# Patient Record
Sex: Male | Born: 1960
Health system: Southern US, Community
[De-identification: ages and names within clinical notes are randomized; demographics above are authoritative.]

## PROBLEM LIST (undated history)

## (undated) DIAGNOSIS — F329 Major depressive disorder, single episode, unspecified: Secondary | ICD-10-CM

## (undated) DIAGNOSIS — F319 Bipolar disorder, unspecified: Secondary | ICD-10-CM

## (undated) DIAGNOSIS — K219 Gastro-esophageal reflux disease without esophagitis: Secondary | ICD-10-CM

## (undated) DIAGNOSIS — I771 Stricture of artery: Secondary | ICD-10-CM

## (undated) DIAGNOSIS — K449 Diaphragmatic hernia without obstruction or gangrene: Secondary | ICD-10-CM

## (undated) DIAGNOSIS — M1990 Unspecified osteoarthritis, unspecified site: Secondary | ICD-10-CM

## (undated) DIAGNOSIS — R079 Chest pain, unspecified: Secondary | ICD-10-CM

## (undated) DIAGNOSIS — I6502 Occlusion and stenosis of left vertebral artery: Secondary | ICD-10-CM

## (undated) DIAGNOSIS — R519 Headache, unspecified: Secondary | ICD-10-CM

## (undated) DIAGNOSIS — E785 Hyperlipidemia, unspecified: Secondary | ICD-10-CM

## (undated) DIAGNOSIS — E0500 Thyrotoxicosis with diffuse goiter without thyrotoxic crisis or storm: Secondary | ICD-10-CM

## (undated) DIAGNOSIS — I10 Essential (primary) hypertension: Secondary | ICD-10-CM

## (undated) DIAGNOSIS — E039 Hypothyroidism, unspecified: Secondary | ICD-10-CM

## (undated) DIAGNOSIS — I209 Angina pectoris, unspecified: Secondary | ICD-10-CM

## (undated) DIAGNOSIS — M5010 Cervical disc disorder with radiculopathy, unspecified cervical region: Secondary | ICD-10-CM

## (undated) DIAGNOSIS — F419 Anxiety disorder, unspecified: Secondary | ICD-10-CM

## (undated) DIAGNOSIS — K2270 Barrett's esophagus without dysplasia: Secondary | ICD-10-CM

## (undated) DIAGNOSIS — Z87438 Personal history of other diseases of male genital organs: Secondary | ICD-10-CM

## (undated) DIAGNOSIS — E782 Mixed hyperlipidemia: Secondary | ICD-10-CM

## (undated) DIAGNOSIS — I2510 Atherosclerotic heart disease of native coronary artery without angina pectoris: Secondary | ICD-10-CM

## (undated) DIAGNOSIS — M5136 Other intervertebral disc degeneration, lumbar region: Secondary | ICD-10-CM

## (undated) DIAGNOSIS — Z720 Tobacco use: Secondary | ICD-10-CM

## (undated) DIAGNOSIS — M51369 Other intervertebral disc degeneration, lumbar region without mention of lumbar back pain or lower extremity pain: Secondary | ICD-10-CM

## (undated) DIAGNOSIS — I779 Disorder of arteries and arterioles, unspecified: Secondary | ICD-10-CM

## (undated) DIAGNOSIS — F32A Depression, unspecified: Secondary | ICD-10-CM

## (undated) HISTORY — PX: OTHER SURGICAL HISTORY: SHX169

## (undated) HISTORY — DX: Major depressive disorder, single episode, unspecified: F32.9

## (undated) HISTORY — DX: Chest pain, unspecified: R07.9

## (undated) HISTORY — DX: Tobacco use: Z72.0

## (undated) HISTORY — DX: Barrett's esophagus without dysplasia: K22.70

## (undated) HISTORY — DX: Occlusion and stenosis of left vertebral artery: I65.02

## (undated) HISTORY — PX: KNEE SURGERY: SHX244

## (undated) HISTORY — DX: Stricture of artery: I77.1

## (undated) HISTORY — DX: Essential (primary) hypertension: I10

## (undated) HISTORY — DX: Depression, unspecified: F32.A

## (undated) HISTORY — DX: Diaphragmatic hernia without obstruction or gangrene: K44.9

## (undated) HISTORY — DX: Hyperlipidemia, unspecified: E78.5

## (undated) HISTORY — PX: THYROIDECTOMY: SHX17

## (undated) HISTORY — DX: Mixed hyperlipidemia: E78.2

## (undated) HISTORY — DX: Atherosclerotic heart disease of native coronary artery without angina pectoris: I25.10

## (undated) HISTORY — DX: Disorder of arteries and arterioles, unspecified: I77.9

## (undated) HISTORY — DX: Thyrotoxicosis with diffuse goiter without thyrotoxic crisis or storm: E05.00

---

## 1998-09-04 DIAGNOSIS — I209 Angina pectoris, unspecified: Secondary | ICD-10-CM

## 1998-09-04 DIAGNOSIS — I219 Acute myocardial infarction, unspecified: Secondary | ICD-10-CM

## 1998-09-04 HISTORY — PX: CARDIAC CATHETERIZATION: SHX172

## 1998-09-04 HISTORY — DX: Angina pectoris, unspecified: I20.9

## 1998-09-04 HISTORY — DX: Acute myocardial infarction, unspecified: I21.9

## 1999-05-25 ENCOUNTER — Ambulatory Visit (HOSPITAL_COMMUNITY): Admission: RE | Admit: 1999-05-25 | Discharge: 1999-05-25 | Payer: Self-pay | Admitting: *Deleted

## 1999-06-17 ENCOUNTER — Ambulatory Visit (HOSPITAL_COMMUNITY): Admission: RE | Admit: 1999-06-17 | Discharge: 1999-06-17 | Payer: Self-pay | Admitting: *Deleted

## 1999-12-28 ENCOUNTER — Encounter: Admission: RE | Admit: 1999-12-28 | Discharge: 1999-12-28 | Payer: Self-pay | Admitting: Urology

## 1999-12-28 ENCOUNTER — Encounter: Payer: Self-pay | Admitting: Urology

## 2000-01-26 ENCOUNTER — Ambulatory Visit (HOSPITAL_COMMUNITY): Admission: RE | Admit: 2000-01-26 | Discharge: 2000-01-26 | Payer: Self-pay | Admitting: Urology

## 2000-01-26 ENCOUNTER — Encounter: Payer: Self-pay | Admitting: Urology

## 2001-10-09 ENCOUNTER — Encounter: Admission: RE | Admit: 2001-10-09 | Discharge: 2001-10-09 | Payer: Self-pay | Admitting: Internal Medicine

## 2001-10-09 ENCOUNTER — Encounter: Payer: Self-pay | Admitting: Internal Medicine

## 2002-05-19 ENCOUNTER — Encounter: Payer: Self-pay | Admitting: Surgery

## 2002-05-22 ENCOUNTER — Encounter (INDEPENDENT_AMBULATORY_CARE_PROVIDER_SITE_OTHER): Payer: Self-pay | Admitting: Specialist

## 2002-05-22 ENCOUNTER — Observation Stay (HOSPITAL_COMMUNITY): Admission: RE | Admit: 2002-05-22 | Discharge: 2002-05-23 | Payer: Self-pay | Admitting: Surgery

## 2003-02-21 ENCOUNTER — Emergency Department (HOSPITAL_COMMUNITY): Admission: EM | Admit: 2003-02-21 | Discharge: 2003-02-21 | Payer: Self-pay | Admitting: Emergency Medicine

## 2006-02-19 ENCOUNTER — Encounter: Admission: RE | Admit: 2006-02-19 | Discharge: 2006-02-19 | Payer: Self-pay | Admitting: Internal Medicine

## 2006-03-25 ENCOUNTER — Emergency Department (HOSPITAL_COMMUNITY): Admission: EM | Admit: 2006-03-25 | Discharge: 2006-03-25 | Payer: Self-pay | Admitting: *Deleted

## 2006-12-13 ENCOUNTER — Emergency Department (HOSPITAL_COMMUNITY): Admission: EM | Admit: 2006-12-13 | Discharge: 2006-12-13 | Payer: Self-pay | Admitting: Emergency Medicine

## 2007-02-21 ENCOUNTER — Inpatient Hospital Stay (HOSPITAL_COMMUNITY): Admission: EM | Admit: 2007-02-21 | Discharge: 2007-02-22 | Payer: Self-pay | Admitting: Emergency Medicine

## 2007-02-27 ENCOUNTER — Emergency Department (HOSPITAL_COMMUNITY): Admission: EM | Admit: 2007-02-27 | Discharge: 2007-02-27 | Payer: Self-pay | Admitting: Emergency Medicine

## 2007-10-12 ENCOUNTER — Emergency Department (HOSPITAL_COMMUNITY): Admission: EM | Admit: 2007-10-12 | Discharge: 2007-10-12 | Payer: Self-pay | Admitting: Emergency Medicine

## 2007-10-19 ENCOUNTER — Emergency Department (HOSPITAL_COMMUNITY): Admission: EM | Admit: 2007-10-19 | Discharge: 2007-10-19 | Payer: Self-pay | Admitting: Family Medicine

## 2007-11-14 ENCOUNTER — Emergency Department (HOSPITAL_COMMUNITY): Admission: EM | Admit: 2007-11-14 | Discharge: 2007-11-15 | Payer: Self-pay | Admitting: Emergency Medicine

## 2008-03-07 ENCOUNTER — Emergency Department (HOSPITAL_COMMUNITY): Admission: EM | Admit: 2008-03-07 | Discharge: 2008-03-07 | Payer: Self-pay | Admitting: Emergency Medicine

## 2008-03-21 ENCOUNTER — Emergency Department (HOSPITAL_COMMUNITY): Admission: EM | Admit: 2008-03-21 | Discharge: 2008-03-21 | Payer: Self-pay | Admitting: Emergency Medicine

## 2008-10-21 ENCOUNTER — Ambulatory Visit (HOSPITAL_COMMUNITY): Admission: RE | Admit: 2008-10-21 | Discharge: 2008-10-21 | Payer: Self-pay | Admitting: Internal Medicine

## 2010-03-28 ENCOUNTER — Encounter (INDEPENDENT_AMBULATORY_CARE_PROVIDER_SITE_OTHER): Payer: Self-pay | Admitting: *Deleted

## 2010-04-04 ENCOUNTER — Ambulatory Visit: Payer: Self-pay | Admitting: Gastroenterology

## 2010-04-13 ENCOUNTER — Telehealth: Payer: Self-pay | Admitting: Gastroenterology

## 2010-10-06 NOTE — Progress Notes (Signed)
Summary: Needs new Movi prep  Phone Note Call from Patient   Call For: Dr Christella Hartigan Summary of Call: Pt fell and injured his leg. Rescheduled his Colon for 05-10-10. Has already mixed his Movi Prep so he will need a new script sent to his pharmacy. [facility] Initial call taken by: Leanor Kail [facility],  April 13, 2010 9:48 AM    New/Updated Medications: MOVIPREP 100 GM  SOLR (PEG-KCL-NACL-NASULF-NA ASC-C) As per prep instructions. Prescriptions: MOVIPREP 100 GM  SOLR (PEG-KCL-NACL-NASULF-NA ASC-C) As per prep instructions.  #1 x 0   Entered by:   Wyona Almas RN   Authorized by:   Rachael Fee MD   Signed by:   Wyona Almas RN on 04/13/2010   Method used:   Electronically to        [facility] (retail)       [address]       [place], [place]  [id number]       Ph: [phone]       Fax: [phone]   RxID:   [id number]

## 2010-10-06 NOTE — Progress Notes (Signed)
Summary: Rescheduled COL  Phone Note Call from Patient   Caller: Patient Call For: Dr. Christella Hartigan Summary of Call: pt. r/s his COL from today until 05-10-10 b/c he slipped and fell on his deck yesterday afternoon and pulled his hamstring and cannot walk. Had already mixed his Moviprep and will need a new script called in for his appt. on 05-10-10..... Would you like this pt. charged the cancelation fee? Initial call taken by: Karna Christmas,  April 13, 2010 8:57 AM  Follow-up for Phone Call        no charge since he was injured Follow-up by: Rachael Fee MD,  April 13, 2010 9:26 AM  Additional Follow-up for Phone Call Additional follow up Details #1::        Patient NOT BILLED. Additional Follow-up by: Leanor Kail [facility],  April 13, 2010 9:46 AM

## 2010-10-06 NOTE — Letter (Signed)
Summary: [facility] Instructions  [facility]  [address] [place], [place] [address]   Phone: [phone]  Fax: [phone]       [facility]    12-07-1960    MRN: [id number]        Procedure Day /Date:  04/13/10  Wednesday     Arrival Time:  1pm      Procedure Time: 2pm     Location of Procedure:                    _ x_  [facility] (4th Floor)   PREPARATION FOR COLONOSCOPY WITH MOVIPREP   Starting 5 days prior to your procedure _8/5/11 _ do not eat nuts, seeds, popcorn, corn, beans, peas,  salads, or any raw vegetables.  Do not take any fiber supplements (e.g. Metamucil, Citrucel, and Benefiber).  THE DAY BEFORE YOUR PROCEDURE         DATE:   04/12/10  DAY:  Tuesday  1.  Drink clear liquids the entire day-NO SOLID FOOD  2.  Do not drink anything colored red or purple.  Avoid juices with pulp.  No orange juice.  3.  Drink at least 64 oz. (8 glasses) of fluid/clear liquids during the day to prevent dehydration and help the prep work efficiently.  CLEAR LIQUIDS INCLUDE: Water Jello Ice Popsicles Tea (sugar ok, no milk/cream) Powdered fruit flavored drinks Coffee (sugar ok, no milk/cream) Gatorade Juice: apple, white grape, white cranberry  Lemonade Clear bullion, consomm, broth Carbonated beverages (any kind) Strained chicken noodle soup Hard Candy                             4.  In the morning, mix first dose of MoviPrep solution:    Empty 1 Pouch A and 1 Pouch B into the disposable container    Add lukewarm drinking water to the top line of the container. Mix to dissolve    Refrigerate (mixed solution should be used within 24 hrs)  5.  Begin drinking the prep at 5:00 p.m. The MoviPrep container is divided by 4 marks.   Every 15 minutes drink the solution down to the next mark (approximately 8 oz) until the full liter is complete.   6.  Follow completed prep with 16 oz of clear liquid of your choice (Nothing red or purple).   Continue to drink clear liquids until bedtime.  7.  Before going to bed, mix second dose of MoviPrep solution:    Empty 1 Pouch A and 1 Pouch B into the disposable container    Add lukewarm drinking water to the top line of the container. Mix to dissolve    Refrigerate  THE DAY OF YOUR PROCEDURE      DATE:   04/13/10  DAY:  Wednesday  Beginning at 5:00 a.m. (5 hours before procedure):         1. Every 15 minutes, drink the solution down to the next mark (approx 8 oz) until the full liter is complete.  2. Follow completed prep with 16 oz. of clear liquid of your choice.    3. You may drink clear liquids until  12n  (2 HOURS BEFORE PROCEDURE).   MEDICATION INSTRUCTIONS  Unless otherwise instructed, you should take regular prescription medications with a small sip of water   as early as possible the morning of your procedure.   Additional medication instructions: n/a  OTHER INSTRUCTIONS  You will need a responsible adult at least 49 years of age to accompany you and drive you home.   This person must remain in the waiting room during your procedure.  Wear loose fitting clothing that is easily removed.  Leave jewelry and other valuables at home.  However, you may wish to bring a book to read or  an iPod/MP3 player to listen to music as you wait for your procedure to start.  Remove all body piercing jewelry and leave at home.  Total time from sign-in until discharge is approximately 2-3 hours.  You should go home directly after your procedure and rest.  You can resume normal activities the  day after your procedure.  The day of your procedure you should not:   Drive   Make legal decisions   Operate machinery   Drink alcohol   Return to work  You will receive specific instructions about eating, activities and medications before you leave.    The above instructions have been reviewed and explained to me by   Sherren Kerns RN  April 04, 2010 2:23  PM    I fully understand and can verbalize these instructions _____________________________ Date _________

## 2010-10-06 NOTE — Miscellaneous (Signed)
Summary: previsit/rm  Clinical Lists Changes  Medications: Added new medication of MOVIPREP 100 GM  SOLR (PEG-KCL-NACL-NASULF-NA ASC-C) As per prep instructions. - Signed Rx of MOVIPREP 100 GM  SOLR (PEG-KCL-NACL-NASULF-NA ASC-C) As per prep instructions.;  #1 x 0;  Signed;  Entered by: Sherren Kerns RN;  Authorized by: Rachael Fee MD;  Method used: Electronically to [facility] [address] [place], [place]  [id number], Ph: [phone], Fax: [phone] Observations: Added new observation of ALLERGY REV: Done (04/04/2010 13:34)    Prescriptions: MOVIPREP 100 GM  SOLR (PEG-KCL-NACL-NASULF-NA ASC-C) As per prep instructions.  #1 x 0   Entered by:   Sherren Kerns RN   Authorized by:   Rachael Fee MD   Signed by:   Sherren Kerns RN on 04/04/2010   Method used:   Electronically to        [facility] (retail)       [address]       [place], [place]  [id number]       Ph: [phone]       Fax: [phone]   RxID:   [id number]

## 2011-01-17 NOTE — Consult Note (Signed)
[name], [name]            ACCOUNT NO.:  [id number]   MEDICAL RECORD NO.:  [id number]          PATIENT TYPE:  INP   LOCATION:  5707                         FACILITY:  [facility]   PHYSICIAN:  Zola Button T. Lazarus Salines, M.D. DATE OF BIRTH:  [birth year]   DATE OF CONSULTATION:  02/21/2007  DATE OF DISCHARGE:                                 CONSULTATION   CHIEF COMPLAINT:  Throat swelling.   HISTORY OF PRESENT ILLNESS:  This 50 year old white male was napping in  a hammock last evening.  He came back into the house attempted to take  his routine pills and found that he could not swallow them, both clumsy  and painful.  Shortly thereafter he felt like he had a lump in his  throat and looked in and saw that his uvula was enlarged.  He had the  strong sensation that he needed to cough and clear his throat and  actually produced a small amount of blood.  He came to the emergency  room for evaluation.  Dr. Read Drivers noted uvular edema consistent with  angioedema but felt like the level pain was out of proportion with this  typical diagnosis.  He did a lateral soft tissue of the neck which  suggested edema of the epiglottis and supraglottic structures and ENT  was called in consultation.  The patient is being admitted on the  internal medicine service of Dr. Waynard Edwards.   The patient is not taking ACE inhibitors.  He does smoke one pack per  day.  He has bipolar illness.  He has never had a prior similar problem.  He does not have any substantial reflux issues that he is aware of.  No  history of trauma to the throat.  He does not think inhaled an insect or  similar which may have stung him last evening outdoors.  No hoarseness.  Although his throat feels tight, he is breathing comfortably.  No  wheezing.  No belly cramping.  No unusual foods.  No new medications.  He does not take any ACE inhibitors although he does have hypertension.   PHYSICAL EXAMINATION:  This is articulate, nondistressed,  middle-aged  white male.  Voice is loud and clear without hot potato changes or any  stridor or labor.  Mental status is appropriate.  The head is atraumatic  and neck supple.  Cranial nerves intact.  The right ear canal is clear  with a normal drum.  Left ear canal has a wax impaction in the deep  canal which I could not clear in the emergency room and I could not see  the drum either.  Anterior nose is corrugated with a decent airway on  both sides and healthy membranes.  Oral cavity is moist with teeth in  good repair and normal size lips and tongue.  Oropharynx shows  substantial edema of the uvula but it is not translucent nor is not  erythematous.  No evidence of exudate.  The posterior pharyngeal wall  looks normal.  NECK:  Without swelling, tenderness, or adenopathy.  He has a scar  consistent with prior thyroidectomy.   Following  1% viscous Xylocaine to both sides of the nose, 5 mL total,  the flexible laryngoscope was introduced.  The nasopharynx is clear with  normal eustachian tori.  Oropharynx clear with prominent lingual  tonsils.  Hypopharynx reveals completely normal larynx with mobile vocal  cords and no swelling or edema of the structures.  No pooling,  valleculae or piriformis.   IMPRESSION:  Uvular edema with slight hemoptysis, odynophagia,  dysphagia.  This sounds most similar to angioedema although he is not  taking ACE inhibitors.  No fever, white count 7.8000 and normal  laryngeal examination, therefore he does not have epiglottitis.  I do  not think he has an infectious etiology.  He has an incidental left  cerumen impaction.   PLAN:  I will let Dr. Waynard Edwards pursue an angioedema workup.  If he does  not turn around fairly quickly, I would check a barium swallow.  I think  he is okay to advance diet as comfortable.  From an airway standpoint,  he could be  discharged at any time but it would be nice to know that his other  symptoms were improving before we let  him go.  I will follow him with  you in the hospital and then plan on seeing him again in my office in  several weeks.  He understands and agrees.      Gloris Manchester. Lazarus Salines, M.D.  Electronically Signed     KTW/MEDQ  D:  02/21/2007  T:  02/21/2007  Job:  [id number]   cc:   Loraine Leriche A. Perini, M.D.
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
## 2011-01-17 NOTE — Discharge Summary (Signed)
[name], [name]            ACCOUNT NO.:  [id number]   MEDICAL RECORD NO.:  [id number]          PATIENT TYPE:  INP   LOCATION:  5707                         FACILITY:  [facility]   PHYSICIAN:  Gaspar Garbe, M.D.DATE OF BIRTH:  Feb 21, 1961   DATE OF ADMISSION:  02/20/2007  DATE OF DISCHARGE:  02/22/2007                               DISCHARGE SUMMARY   DIAGNOSES:  1. Soft palate swelling, epiglottitis.  2. Question hymenopteran reaction.  3. Hypertension.  4. Graves' disease status post thyroid removal in 2003, on Synthroid.  5. Bipolar disorder, not otherwise specified.  6. Hyperlipidemia.   MEDICATIONS ON DISCHARGE:  1. Prednisone 40 mg a day x6 days.  2. Ceftin 500 mg twice a day x6 days.  3. Lotrel 10/20 once daily.  4. Atenolol 25 mg daily.  5. Tricor 145 mg once daily.  6. Synthroid 200 mcg once daily.  7. Wellbutrin XL 300 mg once daily.  8. Celexa 10 mg once daily.  9. Depakote ER 300 mg three times daily.  10.Klonopin 1 mg twice daily.  11.Epipen.   STUDIES:  X-ray consistent with soft tissue swelling and question of  epiglottitis.  Laboratory tests show cultures are negative for blood.  TSH normal at 1.216.  Depakote level 30.8 which is low.  Hepatic  function panel within normal limits.  White count 7.8, hemoglobin 14.5,  platelets 214.   SUMMARY OF HOSPITAL COURSE:  [name] was admitted by my partner Dr.  Inetta Fermo on February 20, 2007 after falling asleep in a hammock and waking up  with wheezing with breathing.  The patient had had some barbecue prior  and is not certain as to whether he had been stung by a bee, though he  did not notice any stings, although he has a preexisting condition of  allergy to hymenopteran venom.  The patient was seen in the emergency  room and found to have a swollen soft palate and signs consistent with  possible epiglottitis.  The patient was admitted late in the evening on  the 18th and was seen by Dr. Lazarus Salines with ENT.  He  was started on  dexamethasone and IV Rocephin.  The patient was observed throughout the  day on the 19th and throughout the evening of the 19th.  He did not have  any worsening of his swelling, in fact had considerable improvement.  On  the morning of the 20th he felt to be about 90% better.  His airway  appeared to be improved on physical examination, and he was not having  any further wheezing.  His uvula and soft palate were still somewhat  enlarged, however.  The patient was discharged on the above medications  in good condition.   PHYSICAL EXAMINATION ON DISCHARGE:  VITAL SIGNS:  Temperature 97.8,  blood pressure 142/86, respiratory rate 18, heart rate 68.  GENERAL:  No acute distress.  HEENT:  Normocephalic, atraumatic.  ENT shows an enlarged uvula and soft  palate.  No wheezing or stridor noted.  The patient has persistent  exophthalmus status post Graves'.  LUNGS:  Clear to auscultation bilaterally.  HEART:  Regular rate and rhythm.  No murmurs, rubs or gallops.  NECK:  Status post thyroidectomy.  EXTREMITIES:  No clubbing, cyanosis or edema.  NEURO:  The patient is oriented to person, place and time.  No deficits  noted.   DISPOSITION:  The patient is to follow up with me as needed.  He has  seen an allergist before and may require further allergy testing and  desensitization.  If he has any further difficulties with breathing he  is to contact our office or go to the emergency room this weekend.      Gaspar Garbe, M.D.  Electronically Signed     RWT/MEDQ  D:  02/22/2007  T:  02/23/2007  Job:  [id number]

## 2011-01-20 NOTE — Op Note (Signed)
[name], [name]                      ACCOUNT NO.:  [id number]   MEDICAL RECORD NO.:  [id number]                   PATIENT TYPE:  AMB   LOCATION:  DAY                                  FACILITY:  [facility]   PHYSICIAN:  Velora Heckler, M.D.                DATE OF BIRTH:  December 28, 1960   DATE OF PROCEDURE:  05/22/2002  DATE OF DISCHARGE:                                 OPERATIVE REPORT   PREOPERATIVE DIAGNOSIS:  Graves' disease.   POSTOPERATIVE DIAGNOSIS:  Graves' disease.   PROCEDURE:  Total thyroidectomy.   SURGEON:  Velora Heckler, M.D.   ASSISTANT:  Rose Phi. Maple Hudson, M.D.   ANESTHESIA:  General.   ESTIMATED BLOOD LOSS:  Minimal.   PREPARATION:  Betadine.   COMPLICATIONS:  None.   INDICATIONS FOR PROCEDURE:  The patient is a 50 year old white male who  presents with longstanding Graves's disease. The patient has been treated on  PTU and beta blockade. He is followed by Dr. Wylene Simmer at [facility]. The patient has developed ocular involvement. He now comes for  thyroidectomy for management of Graves' disease.   DESCRIPTION OF PROCEDURE:  The procedure was done in OR #2 at the [facility]. The patient's brought to the operating room, placed  in the supine position on the operating room table. Following the  administration of general anesthesia, the patient is positioned and then  prepped and draped in the usual strict aseptic fashion. After ascertaining  that an adequate level of anesthesia had been obtained, a Kocher incision  was made with a #10 blade. Dissection was carried down through the platysma  and subcutaneous tissues. Hemostasis was obtained with the electrocautery.  Skin flaps were developed cephalad and caudad using the electrocautery for  hemostasis. Skin flaps are developed from the thyroid notch to the sternal  notch. A Mahorner self retaining retractor was placed for exposure. The  strap muscles are incised in the  midline and dissection is begun on the left  side. The strap muscles are reflected laterally. The left thyroid lobe is  small, firm, without dominant mass or nodule. Venous tributaries are divided  between small Ligaclips. The adventitial tissues are gently stripped off the  surface of the gland. The superior pole is mobilized. The superior pole  vessels are ligated in continuity with 2-0 silk ligatures and medium  Ligaclips and then divided. The gland is rolled anteriorly. The branches of  the inferior thyroid artery are divided between small Ligaclips. Dissection  is carried on the surface of the gland. Care is taken to avoid injury to  parathyroid glands or recurrent laryngeal nerve. Inferior venus tributaries  are divided between small Ligaclips. The gland is rolled up and onto the  anterior surface of the trachea. The ligament of Allyson Sabal is transected. The  gland is mobilized over into the mid trachea. A dry pack is placed in the  left neck. Next, we turned our attention to the right neck. Again the strap  muscles are reflected laterally. The gland is mobilized. The venus  tributaries are divided between small and medium Ligaclips. The right lobe  of the gland is much larger than the left, again firm, without dominant mass  or nodule. It extends considerably posterior. The gland is mobilized with  blunt dissection with a Barista. The superior pole vessels again  are ligated in continuity with 2-0 silk ties and medium Ligaclips and  divided. The inferior pole venous tributaries are divided between Ligaclips.  The gland is rolled anteriorly. The branches of the inferior thyroid artery  are divided between small Ligaclips. Again care is taken to preserve the  recurrent laryngeal nerve and parathyroid tissue. The ligament of Allyson Sabal is  transected and the gland is rolled up and onto the trachea. The remaining  isthmus of the thyroid gland is excised off of the trachea. A venous   tributary in the lower midline is ligated with a 2-0 silk ligature and  divided. The gland is marked with a suture in the left lobe superior pole  and submitted to pathology for review. The neck is irrigated with warm  saline. Surgicel is placed over the area of the recurrent laryngeal nerves  bilaterally. Good hemostasis is noted. The strap muscles were reapproximated  in the midline with interrupted 3-0 Vicryl sutures. The platysma was  reapproximated with interrupted 3-0 Vicryl sutures. The skin edges are  reapproximated with widely spaced stainless steel staples and interspaced  1/2 inch Steri-Strips and Benzoin. Sterile dressings are placed. The patient  is awakened from anesthesia and brought to the recovery room in stable  condition. The patient tolerated the procedure well.                                               Velora Heckler, M.D.    TMG/MEDQ  D:  05/22/2002  T:  05/22/2002  Job:  [id number]   cc:   Gaspar Garbe, M.D.

## 2011-05-29 LAB — COMPREHENSIVE METABOLIC PANEL
Alkaline Phosphatase: 126 — ABNORMAL HIGH
BUN: 16
Chloride: 104
GFR calc non Af Amer: >60
Glucose, Bld: 100 — ABNORMAL HIGH
Potassium: 3.8
Total Bilirubin: 0.6

## 2011-05-29 LAB — DIFFERENTIAL
Basophils Absolute: 0.1
Basophils Relative: 1
Neutro Abs: 8 — ABNORMAL HIGH
Neutrophils Relative %: 61

## 2011-05-29 LAB — POCT CARDIAC MARKERS
CKMB, poc: 1.4
Myoglobin, poc: 33.2

## 2011-05-29 LAB — CBC
HCT: 37.8 — ABNORMAL LOW
Hemoglobin: 13
RDW: 14.3
WBC: 13.2 — ABNORMAL HIGH

## 2011-06-02 LAB — POCT CARDIAC MARKERS: CKMB, poc: 1.1

## 2011-06-21 LAB — HEPATIC FUNCTION PANEL
Alkaline Phosphatase: 87
Bilirubin, Direct: <0.1
Total Bilirubin: 0.5
Total Protein: 7.5

## 2011-06-21 LAB — I-STAT 8, (EC8 V) (CONVERTED LAB)
Acid-Base Excess: 1
Glucose, Bld: 133 — ABNORMAL HIGH
HCT: 46
Hemoglobin: 15.6
Operator id: 270651
Potassium: 4.8
Sodium: 140
TCO2: 27

## 2011-06-21 LAB — CBC
Hemoglobin: 14.5
MCHC: 33.7
MCV: 90
RBC: 4.78

## 2011-06-21 LAB — DIFFERENTIAL
Basophils Relative: 1
Monocytes Absolute: 0.2
Monocytes Relative: 3
Neutro Abs: 6.3

## 2011-06-21 LAB — CULTURE, BLOOD (ROUTINE X 2): Culture: NO GROWTH

## 2011-06-21 LAB — TSH: TSH: 1.216

## 2011-07-11 ENCOUNTER — Other Ambulatory Visit: Payer: Self-pay | Admitting: Internal Medicine

## 2011-07-11 ENCOUNTER — Ambulatory Visit
Admission: RE | Admit: 2011-07-11 | Discharge: 2011-07-11 | Disposition: A | Discharge status: Home / Self Care | Payer: Medicare Other | Source: Ambulatory Visit | Attending: Internal Medicine | Admitting: Internal Medicine

## 2011-07-11 DIAGNOSIS — M5412 Radiculopathy, cervical region: Secondary | ICD-10-CM

## 2011-09-13 ENCOUNTER — Other Ambulatory Visit: Payer: Self-pay | Admitting: Internal Medicine

## 2011-09-13 DIAGNOSIS — R42 Dizziness and giddiness: Secondary | ICD-10-CM

## 2011-09-15 ENCOUNTER — Ambulatory Visit
Admission: RE | Admit: 2011-09-15 | Discharge: 2011-09-15 | Disposition: A | Discharge status: Home / Self Care | Payer: Medicare Other | Source: Ambulatory Visit | Attending: Internal Medicine | Admitting: Internal Medicine

## 2011-09-15 ENCOUNTER — Other Ambulatory Visit: Payer: Medicare Other

## 2011-09-15 DIAGNOSIS — R42 Dizziness and giddiness: Secondary | ICD-10-CM

## 2011-10-03 ENCOUNTER — Other Ambulatory Visit: Payer: Self-pay

## 2011-10-03 ENCOUNTER — Inpatient Hospital Stay (HOSPITAL_COMMUNITY)
Admission: EM | Admit: 2011-10-03 | Discharge: 2011-10-06 | DRG: 305 | Disposition: A | Discharge status: Home / Self Care | Payer: Medicare Other | Attending: Internal Medicine | Admitting: Internal Medicine

## 2011-10-03 ENCOUNTER — Encounter (HOSPITAL_COMMUNITY): Payer: Self-pay | Admitting: Emergency Medicine

## 2011-10-03 DIAGNOSIS — E039 Hypothyroidism, unspecified: Secondary | ICD-10-CM | POA: Diagnosis present

## 2011-10-03 DIAGNOSIS — K219 Gastro-esophageal reflux disease without esophagitis: Secondary | ICD-10-CM | POA: Diagnosis present

## 2011-10-03 DIAGNOSIS — I10 Essential (primary) hypertension: Principal | ICD-10-CM | POA: Diagnosis present

## 2011-10-03 DIAGNOSIS — E0500 Thyrotoxicosis with diffuse goiter without thyrotoxic crisis or storm: Secondary | ICD-10-CM | POA: Diagnosis present

## 2011-10-03 DIAGNOSIS — R112 Nausea with vomiting, unspecified: Secondary | ICD-10-CM | POA: Diagnosis present

## 2011-10-03 DIAGNOSIS — E785 Hyperlipidemia, unspecified: Secondary | ICD-10-CM | POA: Diagnosis present

## 2011-10-03 DIAGNOSIS — F319 Bipolar disorder, unspecified: Secondary | ICD-10-CM | POA: Diagnosis present

## 2011-10-03 DIAGNOSIS — R51 Headache: Secondary | ICD-10-CM | POA: Diagnosis present

## 2011-10-03 DIAGNOSIS — R079 Chest pain, unspecified: Secondary | ICD-10-CM | POA: Diagnosis present

## 2011-10-03 DIAGNOSIS — K5900 Constipation, unspecified: Secondary | ICD-10-CM | POA: Diagnosis present

## 2011-10-03 HISTORY — DX: Thyrotoxicosis with diffuse goiter without thyrotoxic crisis or storm: E05.00

## 2011-10-03 HISTORY — DX: Essential (primary) hypertension: I10

## 2011-10-03 HISTORY — DX: Bipolar disorder, unspecified: F31.9

## 2011-10-03 NOTE — ED Notes (Signed)
Pt states he has had chest pain since this morning  Pt states his blood pressure has been elevated all day  Pt has also had vomiting today with fever at times  Pt states he has also been dizzy today

## 2011-10-03 NOTE — ED Notes (Signed)
Pt st's he started having chest pain, SOB, and nausea this morning around 3am.  St's the pain has radiated to his L arm but isn't right now.  St's the pain is a pressure type pain, pt's BP is 202/108, st's he's had a h/a most of the day.  Also st's he has vomited twice, still feels nauseated and has been dry heaving.  No abdominal pain, pt has been feeling dizzy.  St's his blood pressure has been this high all day, st's he has taken his BP meds today.

## 2011-10-04 ENCOUNTER — Encounter (HOSPITAL_COMMUNITY): Payer: Self-pay | Admitting: Family Medicine

## 2011-10-04 ENCOUNTER — Emergency Department (HOSPITAL_COMMUNITY): Payer: Medicare Other

## 2011-10-04 ENCOUNTER — Other Ambulatory Visit: Payer: Self-pay

## 2011-10-04 DIAGNOSIS — R079 Chest pain, unspecified: Secondary | ICD-10-CM

## 2011-10-04 DIAGNOSIS — F319 Bipolar disorder, unspecified: Secondary | ICD-10-CM | POA: Diagnosis present

## 2011-10-04 DIAGNOSIS — E0500 Thyrotoxicosis with diffuse goiter without thyrotoxic crisis or storm: Secondary | ICD-10-CM

## 2011-10-04 DIAGNOSIS — I10 Essential (primary) hypertension: Secondary | ICD-10-CM | POA: Diagnosis present

## 2011-10-04 HISTORY — DX: Chest pain, unspecified: R07.9

## 2011-10-04 HISTORY — DX: Thyrotoxicosis with diffuse goiter without thyrotoxic crisis or storm: E05.00

## 2011-10-04 LAB — DIFFERENTIAL
Basophils Absolute: 0.1 10*3/uL (ref 0.0–0.1)
Basophils Relative: 1 % (ref 0–1)
Eosinophils Relative: 2 % (ref 0–5)
Monocytes Absolute: 1 10*3/uL (ref 0.1–1.0)
Monocytes Relative: 7 % (ref 3–12)
Neutro Abs: 8.6 10*3/uL — ABNORMAL HIGH (ref 1.7–7.7)

## 2011-10-04 LAB — LIPID PANEL
Cholesterol: 300 mg/dL — ABNORMAL HIGH (ref 0–200)
HDL: 42 mg/dL (ref >39)
Total CHOL/HDL Ratio: 7.1 RATIO

## 2011-10-04 LAB — CBC
HCT: 39.4 % (ref 39.0–52.0)
Hemoglobin: 14.2 g/dL (ref 13.0–17.0)
Hemoglobin: 14.1 g/dL (ref 13.0–17.0)
MCH: 30 pg (ref 26.0–34.0)
MCHC: 36 g/dL (ref 30.0–36.0)
MCV: 83.1 fL (ref 78.0–100.0)
MCV: 82.7 fL (ref 78.0–100.0)
RDW: 12.8 % (ref 11.5–15.5)

## 2011-10-04 LAB — URINALYSIS, ROUTINE W REFLEX MICROSCOPIC
Ketones, ur: NEGATIVE mg/dL
Leukocytes, UA: NEGATIVE
Nitrite: NEGATIVE
Protein, ur: NEGATIVE mg/dL
pH: 6.5 (ref 5.0–8.0)

## 2011-10-04 LAB — COMPREHENSIVE METABOLIC PANEL
Albumin: 4.7 g/dL (ref 3.5–5.2)
BUN: 13 mg/dL (ref 6–23)
Calcium: 10.4 mg/dL (ref 8.4–10.5)
Chloride: 97 mEq/L (ref 96–112)
Creatinine, Ser: 0.82 mg/dL (ref 0.50–1.35)
GFR calc non Af Amer: >90 mL/min (ref >90)
Total Bilirubin: 0.3 mg/dL (ref 0.3–1.2)

## 2011-10-04 LAB — MAGNESIUM: Magnesium: 1.7 mg/dL (ref 1.5–2.5)

## 2011-10-04 LAB — LIPASE, BLOOD: Lipase: 58 U/L (ref 11–59)

## 2011-10-04 LAB — CARDIAC PANEL(CRET KIN+CKTOT+MB+TROPI): Total CK: 849 U/L — ABNORMAL HIGH (ref 7–232)

## 2011-10-04 LAB — MRSA PCR SCREENING: MRSA by PCR: NEGATIVE

## 2011-10-04 LAB — CREATININE, SERUM
GFR calc Af Amer: >90 mL/min (ref >90)
GFR calc non Af Amer: >90 mL/min (ref >90)

## 2011-10-04 MED ORDER — CITALOPRAM HYDROBROMIDE 20 MG PO TABS
20.0000 mg | ORAL_TABLET | Freq: Every day | ORAL | Status: DC
  Administered 2011-10-04 – 2011-10-06 (×3): 20 mg via ORAL
  Filled 2011-10-04 (×4): qty 1

## 2011-10-04 MED ORDER — NITROGLYCERIN 2 % TD OINT
1.0000 [in_us] | TOPICAL_OINTMENT | Freq: Once | TRANSDERMAL | Status: AC
  Administered 2011-10-04: 1 [in_us] via TOPICAL

## 2011-10-04 MED ORDER — RISPERIDONE 1 MG PO TABS
1.0000 mg | ORAL_TABLET | Freq: Every day | ORAL | Status: DC
  Administered 2011-10-04 – 2011-10-05 (×2): 1 mg via ORAL
  Filled 2011-10-04 (×4): qty 1

## 2011-10-04 MED ORDER — ENOXAPARIN SODIUM 40 MG/0.4ML ~~LOC~~ SOLN
40.0000 mg | SUBCUTANEOUS | Status: DC
  Administered 2011-10-04 – 2011-10-06 (×3): 40 mg via SUBCUTANEOUS
  Filled 2011-10-04 (×3): qty 0.4

## 2011-10-04 MED ORDER — PROMETHAZINE HCL 25 MG/ML IJ SOLN
12.5000 mg | Freq: Four times a day (QID) | INTRAMUSCULAR | Status: DC | PRN
  Administered 2011-10-04: 18:00:00 via INTRAVENOUS
  Filled 2011-10-04: qty 1

## 2011-10-04 MED ORDER — ACETAMINOPHEN 325 MG PO TABS
650.0000 mg | ORAL_TABLET | Freq: Four times a day (QID) | ORAL | Status: DC | PRN
  Administered 2011-10-04: 650 mg via ORAL
  Filled 2011-10-04: qty 2

## 2011-10-04 MED ORDER — SODIUM CHLORIDE 0.9 % IV SOLN
INTRAVENOUS | Status: DC
  Administered 2011-10-04 – 2011-10-05 (×3): via INTRAVENOUS

## 2011-10-04 MED ORDER — FENOFIBRATE 54 MG PO TABS
54.0000 mg | ORAL_TABLET | Freq: Every day | ORAL | Status: DC
  Administered 2011-10-04 – 2011-10-06 (×3): 54 mg via ORAL
  Filled 2011-10-04 (×4): qty 1

## 2011-10-04 MED ORDER — ASPIRIN 81 MG PO CHEW
324.0000 mg | CHEWABLE_TABLET | Freq: Once | ORAL | Status: AC
  Administered 2011-10-04: 324 mg via ORAL
  Filled 2011-10-04: qty 4

## 2011-10-04 MED ORDER — OXYCODONE HCL 15 MG PO TB12: 15.0000 mg | ORAL_TABLET | ORAL | Status: DC | PRN

## 2011-10-04 MED ORDER — QUETIAPINE FUMARATE ER 300 MG PO TB24
300.0000 mg | ORAL_TABLET | Freq: Every day | ORAL | Status: DC
  Administered 2011-10-04 – 2011-10-05 (×2): 300 mg via ORAL
  Filled 2011-10-04 (×5): qty 1

## 2011-10-04 MED ORDER — BUPROPION HCL ER (SR) 150 MG PO TB12
300.0000 mg | ORAL_TABLET | Freq: Every day | ORAL | Status: DC
  Administered 2011-10-04 – 2011-10-06 (×3): 300 mg via ORAL
  Filled 2011-10-04 (×4): qty 2

## 2011-10-04 MED ORDER — HYDRALAZINE HCL 20 MG/ML IJ SOLN
10.0000 mg | Freq: Four times a day (QID) | INTRAMUSCULAR | Status: DC | PRN
  Administered 2011-10-04: 18:00:00 via INTRAVENOUS
  Filled 2011-10-04: qty 0.5

## 2011-10-04 MED ORDER — HYDRALAZINE HCL 20 MG/ML IJ SOLN
10.0000 mg | Freq: Once | INTRAMUSCULAR | Status: AC
  Administered 2011-10-04: 10 mg via INTRAVENOUS
  Filled 2011-10-04 (×2): qty 0.5

## 2011-10-04 MED ORDER — DOXAZOSIN MESYLATE 8 MG PO TABS
8.0000 mg | ORAL_TABLET | Freq: Every day | ORAL | Status: DC
  Administered 2011-10-04 – 2011-10-05 (×2): 8 mg via ORAL
  Filled 2011-10-04 (×5): qty 1

## 2011-10-04 MED ORDER — LEVOTHYROXINE SODIUM 150 MCG PO TABS
150.0000 ug | ORAL_TABLET | Freq: Every day | ORAL | Status: DC
  Administered 2011-10-04 – 2011-10-06 (×3): 150 ug via ORAL
  Filled 2011-10-04 (×4): qty 1

## 2011-10-04 MED ORDER — ONDANSETRON HCL 4 MG/2ML IJ SOLN
4.0000 mg | Freq: Once | INTRAMUSCULAR | Status: AC
  Administered 2011-10-04: 4 mg via INTRAVENOUS
  Filled 2011-10-04: qty 2

## 2011-10-04 MED ORDER — ACETAMINOPHEN 650 MG RE SUPP: 650.0000 mg | Freq: Four times a day (QID) | RECTAL | Status: DC | PRN

## 2011-10-04 MED ORDER — NITROGLYCERIN 2 % TD OINT
TOPICAL_OINTMENT | TRANSDERMAL | Status: AC
  Filled 2011-10-04: qty 30

## 2011-10-04 MED ORDER — LABETALOL HCL 5 MG/ML IV SOLN
10.0000 mg | Freq: Once | INTRAVENOUS | Status: AC
  Administered 2011-10-04: 10 mg via INTRAVENOUS
  Filled 2011-10-04: qty 4

## 2011-10-04 MED ORDER — ONDANSETRON HCL 4 MG/2ML IJ SOLN
4.0000 mg | Freq: Four times a day (QID) | INTRAMUSCULAR | Status: DC | PRN
  Administered 2011-10-04 (×4): 4 mg via INTRAVENOUS
  Filled 2011-10-04 (×4): qty 2

## 2011-10-04 MED ORDER — DIAZEPAM 5 MG PO TABS
5.0000 mg | ORAL_TABLET | Freq: Three times a day (TID) | ORAL | Status: DC | PRN
  Administered 2011-10-04: 5 mg via ORAL
  Filled 2011-10-04: qty 1

## 2011-10-04 MED ORDER — ONDANSETRON HCL 4 MG/2ML IJ SOLN
INTRAMUSCULAR | Status: AC
  Filled 2011-10-04: qty 2

## 2011-10-04 MED ORDER — HYDRALAZINE HCL 20 MG/ML IJ SOLN
10.0000 mg | Freq: Once | INTRAMUSCULAR | Status: AC
  Administered 2011-10-04: 10 mg via INTRAVENOUS
  Filled 2011-10-04: qty 0.5

## 2011-10-04 MED ORDER — POTASSIUM CHLORIDE CRYS ER 20 MEQ PO TBCR
20.0000 meq | EXTENDED_RELEASE_TABLET | Freq: Two times a day (BID) | ORAL | Status: DC
  Administered 2011-10-04 – 2011-10-06 (×4): 20 meq via ORAL
  Filled 2011-10-04 (×7): qty 1

## 2011-10-04 MED ORDER — ALUM & MAG HYDROXIDE-SIMETH 200-200-20 MG/5ML PO SUSP: 30.0000 mL | Freq: Four times a day (QID) | ORAL | Status: DC | PRN

## 2011-10-04 MED ORDER — ONDANSETRON HCL 4 MG PO TABS: 4.0000 mg | ORAL_TABLET | Freq: Four times a day (QID) | ORAL | Status: DC | PRN

## 2011-10-04 MED ORDER — LABETALOL HCL 200 MG PO TABS
200.0000 mg | ORAL_TABLET | Freq: Two times a day (BID) | ORAL | Status: DC
  Administered 2011-10-04 – 2011-10-06 (×4): 200 mg via ORAL
  Filled 2011-10-04 (×6): qty 1

## 2011-10-04 MED ORDER — DOCUSATE SODIUM 100 MG PO CAPS
100.0000 mg | ORAL_CAPSULE | Freq: Two times a day (BID) | ORAL | Status: DC
  Administered 2011-10-04 – 2011-10-06 (×5): 100 mg via ORAL
  Filled 2011-10-04 (×7): qty 1

## 2011-10-04 MED ORDER — ONDANSETRON HCL 4 MG/2ML IJ SOLN
4.0000 mg | Freq: Once | INTRAMUSCULAR | Status: AC
  Administered 2011-10-04: 4 mg via INTRAVENOUS

## 2011-10-04 MED ORDER — OMEGA-3-ACID ETHYL ESTERS 1 G PO CAPS
4.0000 g | ORAL_CAPSULE | Freq: Every day | ORAL | Status: DC
  Administered 2011-10-05 – 2011-10-06 (×2): 4 g via ORAL
  Filled 2011-10-04 (×4): qty 4

## 2011-10-04 MED ORDER — ATENOLOL 100 MG PO TABS
100.0000 mg | ORAL_TABLET | Freq: Every day | ORAL | Status: DC
  Administered 2011-10-04: 100 mg via ORAL
  Filled 2011-10-04 (×2): qty 1

## 2011-10-04 MED ORDER — NITROPRUSSIDE SODIUM 25 MG/ML IV SOLN
0.2500 ug/kg/min | INTRAVENOUS | Status: DC
  Administered 2011-10-04: 0.25 ug/kg/min via INTRAVENOUS
  Filled 2011-10-04 (×2): qty 2

## 2011-10-04 NOTE — ED Notes (Signed)
Nipride drip off

## 2011-10-04 NOTE — H&P (Signed)
PCP:   Dr Della Goo  Chief Complaint:  Chest pain  HPI: This is a 51 year old male with known history of hypertension difficult to control. He states although today his blood pressure is been extremely elevated. His systolic blood pressures is a been as high as 221. He's had a headache, he's had nausea and vomiting. He came to the ER when he fell developed chest pains and left-sided, no radiation. Chest pain was described as sharp but that has since resolved. He also reports he was dizzy. He reports no neurological deficits no slurred speech no localized weakness, no altered mental status. He has no cardiac history. He did have a stress test of the heart it 10 years ago. He was about that time he had cardiac vasospasms. Here in the year the patient continues to be nauseous and his blood pressure remains elevated. History provided by the patient was alert and oriented. Patient states his blood pressure is always elevated with systolics often in the 170's. He states he's been on multiple blood pressure medications including clonidine and hydralazine, which has not helped. He does not think he's never had a renal ultrasound to check for renal artery stenosis.  Review of Systems:   anorexia, fever, weight loss,, vision loss, decreased hearing, hoarseness, chest pain, syncope, dyspnea on exertion, peripheral edema, balance deficits, hemoptysis, abdominal pain, melena, hematochezia, severe indigestion/heartburn, hematuria, incontinence, genital sores, muscle weakness, suspicious skin lesions, transient blindness, difficulty walking, depression, unusual weight change, abnormal bleeding, enlarged lymph nodes, angioedema, and breast masses.  Past Medical History: Past Medical History  Diagnosis Date  . Bipolar 1 disorder   . Grave's disease   . Hypertension    Past Surgical History  Procedure Date  . Cardiac catheterization   . Testicular torsion surgery   . Thyroidectomy   . Knee surgery      Medications: Prior to Admission medications   Medication Sig Start Date End Date Taking? Authorizing Provider  atenolol (TENORMIN) 100 MG tablet Take 100 mg by mouth daily.   Yes Historical Provider, MD  buPROPion (WELLBUTRIN SR) 150 MG 12 hr tablet Take 300 mg by mouth daily.    Yes Historical Provider, MD  citalopram (CELEXA) 20 MG tablet Take 20 mg by mouth daily.    Yes Historical Provider, MD  diazepam (VALIUM) 5 MG tablet Take 5 mg by mouth every 8 (eight) hours as needed. Anxiety   Yes Historical Provider, MD  doxazosin (CARDURA) 8 MG tablet Take 8 mg by mouth at bedtime.   Yes Historical Provider, MD  fenofibrate (TRICOR) 145 MG tablet Take 145 mg by mouth daily.   Yes Historical Provider, MD  levothyroxine (SYNTHROID, LEVOTHROID) 150 MCG tablet Take 150 mcg by mouth daily.   Yes Historical Provider, MD  omega-3 acid ethyl esters (LOVAZA) 1 G capsule Take 4 g by mouth daily.   Yes Historical Provider, MD  oxyCODONE (OXYCONTIN) 15 MG TB12 Take 15 mg by mouth as needed.   Yes Historical Provider, MD  QUEtiapine (SEROQUEL XR) 300 MG 24 hr tablet Take 300 mg by mouth at bedtime.   Yes Historical Provider, MD  risperiDONE (RISPERDAL) 1 MG tablet Take 1 mg by mouth at bedtime.    Yes Historical Provider, MD    Allergies:  No Known Allergies  Social History:  reports that he has quit smoking. He does not have any smokeless tobacco history on file. He reports that he does not drink alcohol or use illicit drugs.  Family History: Family  History  Problem Relation Age of Onset  . Hypertension Mother   . Hypertension Father   . Cancer Other   . Hypertension Other     Physical Exam: Filed Vitals:   10/04/11 0327 10/04/11 0400 10/04/11 0417 10/04/11 0511  BP: 186/108 181/99 194/100   Pulse: 81 78    Temp: 98.1 F (36.7 C)     TempSrc: Oral     Resp: 14 14    Weight:    95 kg (209 lb 7 oz)  SpO2: 100% 100%      General:  Alert and oriented times three, well developed and  nourished, no acute distress Eyes: PERRLA, pink conjunctiva, no scleral icterus ENT: Moist oral mucosa, neck supple, no thyromegaly Lungs: clear to ascultation, no wheeze, no crackles, no use of accessory muscles Cardiovascular: regular rate and rhythm, no regurgitation, no gallops, no murmurs. No carotid bruits, no JVD Abdomen: soft, positive BS, non-tender, non-distended, no organomegaly, not an acute abdomen GU: not examined Neuro: CN II - XII grossly intact, sensation intact Musculoskeletal: strength 5/5 all extremities, no clubbing, cyanosis or edema Skin: no rash, no subcutaneous crepitation, no decubitus Psych: appropriate patient   Labs on Admission:   [facility] 10/03/11 0040  NA 135  K 3.2*  CL 97  CO2 24  GLUCOSE 90  BUN 13  CREATININE 0.82  CALCIUM 10.4  MG --  PHOS --    Basename 10/03/11 0040  AST 27  ALT 54*  ALKPHOS 123*  BILITOT 0.3  PROT 8.4*  ALBUMIN 4.7    Basename 10/03/11 0040  LIPASE 58  AMYLASE --    Basename 10/03/11 0040  WBC 13.8*  NEUTROABS 8.6*  HGB 14.2  HCT 39.4  MCV 83.1  PLT 289    Basename 10/03/11 2340  CKTOTAL 849*  CKMB 3.6  CKMBINDEX --  TROPONINI <0.30   No components found with this basename: POCBNP:3  Basename 10/03/11 0040  DDIMER <0.22   No results found for this basename: HGBA1C:2 in the last 72 hours No results found for this basename: CHOL:2,HDL:2,LDLCALC:2,TRIG:2,CHOLHDL:2,LDLDIRECT:2 in the last 72 hours No results found for this basename: TSH,T4TOTAL,FREET3,T3FREE,THYROIDAB in the last 72 hours No results found for this basename: VITAMINB12:2,FOLATE:2,FERRITIN:2,TIBC:2,IRON:2,RETICCTPCT:2 in the last 72 hours  Micro Results: No results found for this or any previous visit (from the past 240 hour(s)).   Radiological Exams on Admission: Dg Chest 2 View  10/04/2011  *RADIOLOGY REPORT*  Clinical Data: Chest pain, hypertension, dizziness  CHEST - 2 VIEW  Comparison: 10/12/2007  Findings: Upper-normal  size of cardiac silhouette. Mediastinal contours and pulmonary vascularity normal. Lungs clear. No pleural effusion or pneumothorax. Question prior thyroidectomy. No acute osseous findings.  IMPRESSION: No acute abnormalities.  Original Report Authenticated By: Lollie Marrow, M.D.   EKG normal sinus  Assessment/Plan Present on Admission:  .Chest pain .Malignant hypertension Headache/nausea vomiting Admit patient to ICU Patient with no neurological deficits Nitride are ordered  Cycle cardiac enzymes and lipid panels Rule out secondary cause for uncontrolled hypertension, renal ultrasound ordered and catecholamine ordered Aspirin, nitroglycerin and lipid panel in a.m. Protonix and antiemetics ordered Bipolar disease Resume home medications   Full code  DVT prophylaxis Team 2   Hedi Barkan 10/04/2011, 6:08 AM

## 2011-10-04 NOTE — ED Provider Notes (Signed)
History     CSN: [id number]  Arrival date & time 10/03/11  2126   First MD Initiated Contact with Patient 10/04/11 0013      Chief Complaint  Patient presents with  . Chest Pain  . Hypertension    (Consider location/radiation/quality/duration/timing/severity/associated sxs/prior treatment) Patient is a 51 y.o. male presenting with chest pain and hypertension. The history is provided by the patient.  Chest Pain The chest pain began yesterday. Chest pain occurs intermittently. The chest pain is resolved. The pain is currently at 0/10. The quality of the pain is described as pressure-like. The pain does not radiate. Primary symptoms include nausea and dizziness. Pertinent negatives for primary symptoms include no fever, no syncope, no shortness of breath, no cough, no wheezing, no palpitations, no abdominal pain and no vomiting.  Dizziness also occurs with nausea. Dizziness does not occur with vomiting or weakness.   Pertinent negatives for associated symptoms include no numbness and no weakness.  His past medical history is significant for hypertension.    Hypertension Associated symptoms include chest pain and headaches. Pertinent negatives include no abdominal pain and no shortness of breath.   Pt woke this Am with pressure-like non-radiating CP and has had several episodes throughout the day lasting 10-15 min each. Currently pain is completely resolved. Pt states he has also had elevated BP throughout the day with intermittent throbbing HA. Pain-free at this time. He has had persistent Nausea with no vomiting. No focal weakness, vision changes.  Past Medical History  Diagnosis Date  . Bipolar 1 disorder   . Grave's disease   . Hypertension     Past Surgical History  Procedure Date  . Cardiac catheterization   . Testicular torsion surgery   . Thyroidectomy   . Knee surgery     Family History  Problem Relation Age of Onset  . Hypertension Mother   . Hypertension Father     . Cancer Other   . Hypertension Other     History  Substance Use Topics  . Smoking status: Former Games developer  . Smokeless tobacco: Never Used  . Alcohol Use: No      Review of Systems  Constitutional: Negative for fever and chills.  HENT: Negative for neck pain and neck stiffness.   Eyes: Negative for photophobia and visual disturbance.  Respiratory: Negative for cough, shortness of breath and wheezing.   Cardiovascular: Positive for chest pain. Negative for palpitations and syncope.  Gastrointestinal: Positive for nausea. Negative for vomiting and abdominal pain.  Genitourinary: Positive for flank pain.  Musculoskeletal: Negative for back pain and joint swelling.  Neurological: Positive for dizziness and headaches. Negative for syncope, weakness and numbness.    Allergies  Review of patient's allergies indicates no known allergies.  Home Medications   No current outpatient prescriptions on file.  BP 162/84  Pulse 77  Temp(Src) 98.4 F (36.9 C) (Oral)  Resp 18  Ht 5\' 8"  (1.727 m)  Wt 208 lb 14.4 oz (94.756 kg)  BMI 31.76 kg/m2  SpO2 96%  Physical Exam  Nursing note and vitals reviewed. Constitutional: He is oriented to person, place, and time. He appears well-developed and well-nourished. No distress.  HENT:  Head: Normocephalic and atraumatic.  Mouth/Throat: Oropharynx is clear and moist.  Eyes: EOM are normal. Pupils are equal, round, and reactive to light.  Neck: Normal range of motion. Neck supple.  Cardiovascular: Normal rate and regular rhythm.   Pulmonary/Chest: Effort normal and breath sounds normal. No respiratory distress. He has  no wheezes. He has no rales.  Abdominal: Soft. Bowel sounds are normal. He exhibits no mass. There is tenderness. There is no rebound and no guarding.       Pt with mild TTP over epigastrum  Musculoskeletal: Normal range of motion. He exhibits no edema and no tenderness.  Neurological: He is alert and oriented to person, place,  and time.  Skin: Skin is warm and dry. No rash noted. No erythema.  Psychiatric: He has a normal mood and affect. His behavior is normal.    ED Course  Procedures (including critical care time)  Labs Reviewed  CBC - Abnormal; Notable for the following:    WBC 13.8 (*)    All other components within normal limits  DIFFERENTIAL - Abnormal; Notable for the following:    Neutro Abs 8.6 (*)    All other components within normal limits  COMPREHENSIVE METABOLIC PANEL - Abnormal; Notable for the following:    Potassium 3.2 (*)    Total Protein 8.4 (*)    ALT 54 (*)    Alkaline Phosphatase 123 (*)    All other components within normal limits  URINALYSIS, ROUTINE W REFLEX MICROSCOPIC - Abnormal; Notable for the following:    Hgb urine dipstick TRACE (*)    All other components within normal limits  CARDIAC PANEL(CRET KIN+CKTOT+MB+TROPI) - Abnormal; Notable for the following:    Total CK 849 (*)    All other components within normal limits  CBC - Abnormal; Notable for the following:    WBC 13.2 (*)    All other components within normal limits  LIPID PANEL - Abnormal; Notable for the following:    Cholesterol 300 (*)    Triglycerides 369 (*)    VLDL 74 (*)    LDL Cholesterol 184 (*)    All other components within normal limits  BASIC METABOLIC PANEL - Abnormal; Notable for the following:    Glucose, Bld 110 (*)    All other components within normal limits  TSH - Abnormal; Notable for the following:    TSH 18.560 (*)    All other components within normal limits  LIPASE, BLOOD  D-DIMER, QUANTITATIVE  URINE MICROSCOPIC-ADD ON  MAGNESIUM  CREATININE, SERUM  MRSA PCR SCREENING  T4, FREE  CORTISOL  CATECHOLAMINES, FRACTIONATED, PLASMA   No results found.  Date: 10/04/2011  Rate: 65  Rhythm: normal sinus rhythm  QRS Axis: normal  Intervals: normal  ST/T Wave abnormalities: normal  Conduction Disutrbances:none  Narrative Interpretation:   Old EKG Reviewed: unchanged    1.  HTN (hypertension)       MDM          Loren Racer, MD 10/06/11 0730

## 2011-10-04 NOTE — ED Notes (Signed)
Patient transported to X-ray 

## 2011-10-04 NOTE — ED Notes (Signed)
Pt complaining of neck pain, st's he recently had a carotid doppler study and they found a partial blockage of the LCA, bruit auscultated.  Pt still complaining of dizziness.

## 2011-10-04 NOTE — ED Notes (Signed)
Emesis 100cc meds given see MAR

## 2011-10-04 NOTE — ED Notes (Signed)
Pt st's his chest pain was an 8/10 prior to administering 1" of Nitro Paste.  The nitro paste has brought pain down to a 3.  Placed pt on 2L supplemental 02.

## 2011-10-04 NOTE — Progress Notes (Addendum)
Subjective: No CP, no headaches. Patient now hungry and w/o acute distress  Objective: Vital signs in last 24 hours: Temp:  [98.1 F (36.7 C)-99.5 F (37.5 C)] 99.5 F (37.5 C) (01/30 2000) Pulse Rate:  [42-107] 96  (01/30 2100) Resp:  [10-22] 21  (01/30 2100) BP: (144-197)/(84-110) 158/96 mmHg (01/30 2100) SpO2:  [93 %-100 %] 97 % (01/30 2100) Weight:  [88.8 kg (195 lb 12.3 oz)-95 kg (209 lb 7 oz)] 88.8 kg (195 lb 12.3 oz) (01/30 1900) Weight change:     Intake/Output from previous day:   Total I/O In: -  Out: 250 [Urine:250]   Physical Exam: General: Alert, awake, oriented x3, in no acute distress. HEENT: No bruits, no goiter. Heart: Regular rate and rhythm, + murmur, rubs, gallops. Lungs: Clear to auscultation bilaterally. Abdomen: Soft, nontender, nondistended, positive bowel sounds. No bruits Extremities: No clubbing cyanosis or edema with positive pedal pulses. Neuro: Grossly intact, nonfocal.  Lab Results: Basic Metabolic Panel:  Basename 10/04/11 0545 10/03/11 0040  NA -- 135  K -- 3.2*  CL -- 97  CO2 -- 24  GLUCOSE -- 90  BUN -- 13  CREATININE 0.77 0.82  CALCIUM -- 10.4  MG 1.7 --  PHOS -- --   Liver Function Tests:  Basename 10/03/11 0040  AST 27  ALT 54*  ALKPHOS 123*  BILITOT 0.3  PROT 8.4*  ALBUMIN 4.7    Basename 10/03/11 0040  LIPASE 58  AMYLASE --   CBC:  Basename 10/04/11 0545 10/03/11 0040  WBC 13.2* 13.8*  NEUTROABS -- 8.6*  HGB 14.1 14.2  HCT 39.7 39.4  MCV 82.7 83.1  PLT 287 289   Cardiac Enzymes:  Basename 10/03/11 2340  CKTOTAL 849*  CKMB 3.6  CKMBINDEX --  TROPONINI <0.30   D-Dimer:  Basename 10/03/11 0040  DDIMER <0.22   Fasting Lipid Panel:  Basename 10/04/11 0545  CHOL 300*  HDL 42  LDLCALC 184*  TRIG 369*  CHOLHDL 7.1  LDLDIRECT --   Urinalysis:  Basename 10/04/11 0033  COLORURINE YELLOW  LABSPEC 1.014  PHURINE 6.5  GLUCOSEU NEGATIVE  HGBUR TRACE*  BILIRUBINUR NEGATIVE  KETONESUR  NEGATIVE  PROTEINUR NEGATIVE  UROBILINOGEN 0.2  NITRITE NEGATIVE  LEUKOCYTESUR NEGATIVE   Misc. Labs:  Recent Results (from the past 240 hour(s))  MRSA PCR SCREENING     Status: Normal   Collection Time   10/04/11  6:51 PM      Component Value Range Status Comment   MRSA by PCR NEGATIVE  NEGATIVE  Final     Studies/Results: Dg Chest 2 View  10/04/2011  *RADIOLOGY REPORT*  Clinical Data: Chest pain, hypertension, dizziness  CHEST - 2 VIEW  Comparison: 10/12/2007  Findings: Upper-normal size of cardiac silhouette. Mediastinal contours and pulmonary vascularity normal. Lungs clear. No pleural effusion or pneumothorax. Question prior thyroidectomy. No acute osseous findings.  IMPRESSION: No acute abnormalities.  Original Report Authenticated By: Lollie Marrow, M.D.    Medications: Scheduled Meds:   . aspirin  324 mg Oral Once  . buPROPion  300 mg Oral Daily  . citalopram  20 mg Oral Daily  . docusate sodium  100 mg Oral BID  . doxazosin  8 mg Oral QHS  . enoxaparin  40 mg Subcutaneous Q24H  . fenofibrate  54 mg Oral Daily  . hydrALAZINE  10 mg Intravenous Once  . hydrALAZINE  10 mg Intravenous Once  . labetalol  200 mg Oral BID  . labetalol  10 mg Intravenous  Once  . levothyroxine  150 mcg Oral Daily  . nitroGLYCERIN  1 inch Topical Once  . omega-3 acid ethyl esters  4 g Oral Daily  . ondansetron  4 mg Intravenous Once  . ondansetron  4 mg Intravenous Once  . potassium chloride  20 mEq Oral BID  . QUEtiapine  300 mg Oral QHS  . risperiDONE  1 mg Oral QHS  . DISCONTD: atenolol  100 mg Oral Daily   Continuous Infusions:   . sodium chloride 100 mL/hr at 10/04/11 2100  . DISCONTD: nitroPRUSSide Stopped (10/04/11 1800)   PRN Meds:.acetaminophen, acetaminophen, alum & mag hydroxide-simeth, diazepam, hydrALAZINE, ondansetron (ZOFRAN) IV, ondansetron, oxyCODONE, promethazine  Assessment/Plan: 1-Malignant hypertension: Follow renal vascular US to r/o renal stenosis; continue  antihypertensive regimen and low sodium diet. BP improved and patient out of the Drip now.  2-Chest pain: cardiac enzymes so far negative; will hold on cardiology consult at this moment; will order 2-D echo and continue tx for malignant hypertension.  3-Bipolar 1 disorder: continue home meds.  4-Grave's disease: s/p thyroid removal; continue synthroid; will check TSH and free T4.  5-GERD: continue PPI.  6-DVT: continue lovenox.    LOS: 1 day   Maebelle Sulton [facility] [phone]  10/04/2011, 9:18 PM

## 2011-10-05 DIAGNOSIS — I10 Essential (primary) hypertension: Secondary | ICD-10-CM

## 2011-10-05 DIAGNOSIS — I87309 Chronic venous hypertension (idiopathic) without complications of unspecified lower extremity: Secondary | ICD-10-CM

## 2011-10-05 HISTORY — DX: Essential (primary) hypertension: I10

## 2011-10-05 LAB — BASIC METABOLIC PANEL
CO2: 21 mEq/L (ref 19–32)
Calcium: 9 mg/dL (ref 8.4–10.5)
GFR calc non Af Amer: >90 mL/min (ref >90)
Glucose, Bld: 110 mg/dL — ABNORMAL HIGH (ref 70–99)
Potassium: 3.5 mEq/L (ref 3.5–5.1)
Sodium: 138 mEq/L (ref 135–145)

## 2011-10-05 LAB — CORTISOL: Cortisol, Plasma: 11.9 ug/dL

## 2011-10-05 NOTE — Progress Notes (Addendum)
Subjective: No CP, no headaches. Patient in no distress and feeling a lot better.  Objective: Vital signs in last 24 hours: Temp:  [99 F (37.2 C)-99.8 F (37.7 C)] 99.8 F (37.7 C) (01/31 0400) Pulse Rate:  [42-108] 85  (01/31 0700) Resp:  [10-22] 19  (01/31 0700) BP: (116-178)/(68-103) 134/72 mmHg (01/31 0700) SpO2:  [93 %-100 %] 96 % (01/31 0700) Weight:  [88.8 kg (195 lb 12.3 oz)] 88.8 kg (195 lb 12.3 oz) (01/30 1900) Weight change: -6.2 kg (-13 lb 10.7 oz)    Intake/Output from previous day: 01/30 0701 - 01/31 0700 In: 1200 [I.V.:1200] Out: 1900 [Urine:1900]    Physical Exam: General: Alert, awake, oriented x3, in no acute distress. HEENT: No bruits, no goiter. Heart: Regular rate and rhythm, positive systolic murmur, no rubs, no gallops. Lungs: Clear to auscultation bilaterally. Abdomen: Soft, nontender, nondistended, positive bowel sounds. No bruits Extremities: No clubbing cyanosis or edema with positive pedal pulses. Neuro: Grossly intact, nonfocal.  Lab Results: Basic Metabolic Panel:  Basename 10/05/11 0315 10/04/11 0545 10/03/11 0040  NA 138 -- 135  K 3.5 -- 3.2*  CL 104 -- 97  CO2 21 -- 24  GLUCOSE 110* -- 90  BUN 11 -- 13  CREATININE 0.79 0.77 --  CALCIUM 9.0 -- 10.4  MG -- 1.7 --  PHOS -- -- --   Liver Function Tests:  Basename 10/03/11 0040  AST 27  ALT 54*  ALKPHOS 123*  BILITOT 0.3  PROT 8.4*  ALBUMIN 4.7    Basename 10/03/11 0040  LIPASE 58  AMYLASE --   CBC:  Basename 10/04/11 0545 10/03/11 0040  WBC 13.2* 13.8*  NEUTROABS -- 8.6*  HGB 14.1 14.2  HCT 39.7 39.4  MCV 82.7 83.1  PLT 287 289   Cardiac Enzymes:  Basename 10/03/11 2340  CKTOTAL 849*  CKMB 3.6  CKMBINDEX --  TROPONINI <0.30   D-Dimer:  Basename 10/03/11 0040  DDIMER <0.22   Fasting Lipid Panel:  Basename 10/04/11 0545  CHOL 300*  HDL 42  LDLCALC 184*  TRIG 369*  CHOLHDL 7.1  LDLDIRECT --   Urinalysis:  Basename 10/04/11 0033  COLORURINE  YELLOW  LABSPEC 1.014  PHURINE 6.5  GLUCOSEU NEGATIVE  HGBUR TRACE*  BILIRUBINUR NEGATIVE  KETONESUR NEGATIVE  PROTEINUR NEGATIVE  UROBILINOGEN 0.2  NITRITE NEGATIVE  LEUKOCYTESUR NEGATIVE   Misc. Labs:  Recent Results (from the past 240 hour(s))  MRSA PCR SCREENING     Status: Normal   Collection Time   10/04/11  6:51 PM      Component Value Range Status Comment   MRSA by PCR NEGATIVE  NEGATIVE  Final     Studies/Results: Dg Chest 2 View  10/04/2011  *RADIOLOGY REPORT*  Clinical Data: Chest pain, hypertension, dizziness  CHEST - 2 VIEW  Comparison: 10/12/2007  Findings: Upper-normal size of cardiac silhouette. Mediastinal contours and pulmonary vascularity normal. Lungs clear. No pleural effusion or pneumothorax. Question prior thyroidectomy. No acute osseous findings.  IMPRESSION: No acute abnormalities.  Original Report Authenticated By: Lollie Marrow, M.D.    Medications: Scheduled Meds:    . buPROPion  300 mg Oral Daily  . citalopram  20 mg Oral Daily  . docusate sodium  100 mg Oral BID  . doxazosin  8 mg Oral QHS  . enoxaparin  40 mg Subcutaneous Q24H  . fenofibrate  54 mg Oral Daily  . labetalol  200 mg Oral BID  . levothyroxine  150 mcg Oral Daily  . omega-3  acid ethyl esters  4 g Oral Daily  . potassium chloride  20 mEq Oral BID  . QUEtiapine  300 mg Oral QHS  . risperiDONE  1 mg Oral QHS  . DISCONTD: atenolol  100 mg Oral Daily   Continuous Infusions:    . sodium chloride 100 mL/hr at 10/05/11 0200  . DISCONTD: nitroPRUSSide Stopped (10/04/11 1800)   PRN Meds:.acetaminophen, acetaminophen, alum & mag hydroxide-simeth, diazepam, hydrALAZINE, ondansetron (ZOFRAN) IV, ondansetron, oxyCODONE, promethazine  Assessment/Plan: 1-Malignant hypertension: Follow renal vascular US to r/o renal stenosis; continue antihypertensive regimen and low sodium diet. BP improved. Continue current regimen for now. Will check cortisol level as well.  2-Chest pain: cardiac  enzymes so far negative; will hold on cardiology consult at this moment; will follow 2-D echo and continue tx for hypertension.  3-Bipolar 1 disorder: continue home meds.  4-Grave's disease: status post thyroid removal continue levothyroxine; will follow TSH and free T4 results.  5-GERD: continue PPI.  6-DVT: continue lovenox.  7-Hypokalemia: repleted and WNL now.    LOS: 2 days   Damani Kelemen [facility] [phone]  10/05/2011, 8:16 AM

## 2011-10-05 NOTE — Progress Notes (Signed)
VASCULAR LAB   Bilateral renal artery duplex completed.    Terance Hart, RVT 10/05/2011, 10:53 AM

## 2011-10-05 NOTE — Progress Notes (Signed)
  Echocardiogram 2D Echocardiogram has been performed.  [name] Kayvan Hoefling 10/05/2011, 8:56 AM

## 2011-10-06 LAB — T4, FREE: Free T4: 1.28 ng/dL (ref 0.80–1.80)

## 2011-10-06 LAB — TSH: TSH: 18.56 u[IU]/mL — ABNORMAL HIGH (ref 0.350–4.500)

## 2011-10-06 MED ORDER — DSS 100 MG PO CAPS: 100.0000 mg | ORAL_CAPSULE | Freq: Two times a day (BID) | ORAL | Status: AC

## 2011-10-06 MED ORDER — TRIAMTERENE-HCTZ 37.5-25 MG PO CAPS: 1.0000 | ORAL_CAPSULE | ORAL | Status: DC

## 2011-10-06 MED ORDER — LEVOTHYROXINE SODIUM 200 MCG PO TABS: 200.0000 ug | ORAL_TABLET | Freq: Every day | ORAL | Status: DC

## 2011-10-06 MED ORDER — LABETALOL HCL 200 MG PO TABS: 200.0000 mg | ORAL_TABLET | Freq: Two times a day (BID) | ORAL | Status: DC

## 2011-10-06 MED ORDER — PRAVASTATIN SODIUM 40 MG PO TABS: 40.0000 mg | ORAL_TABLET | Freq: Every day | ORAL | Status: DC

## 2011-10-06 NOTE — Discharge Summary (Signed)
Physician Discharge Summary  Patient ID: [name] MRN: [id number] DOB/AGE: [birth year] 50 y.o.  Admit date: 10/03/2011 Discharge date: 10/06/2011  Primary Care Physician:  Maryella Shivers   Discharge Diagnoses:   Chest pain (noncardiac in etiology) most likely due to reflux and malignant hypertension syndrome. Malignant hypertension Bipolar 1 disorder Grave's disease (S/p thyroidectomy) Hypothyroidism HLD Constipation (2/2 his hypothyroidism and narcotics use)  Medication List  As of 10/06/2011  1:56 PM   STOP taking these medications         atenolol 100 MG tablet         TAKE these medications         buPROPion 150 MG 12 hr tablet   Commonly known as: WELLBUTRIN SR   Take 300 mg by mouth daily.      citalopram 20 MG tablet   Commonly known as: CELEXA   Take 20 mg by mouth daily.      diazepam 5 MG tablet   Commonly known as: VALIUM   Take 5 mg by mouth every 8 (eight) hours as needed. Anxiety      doxazosin 8 MG tablet   Commonly known as: CARDURA   Take 8 mg by mouth at bedtime.      DSS 100 MG Caps   Take 100 mg by mouth 2 (two) times daily.      fenofibrate 145 MG tablet   Commonly known as: TRICOR   Take 145 mg by mouth daily.      labetalol 200 MG tablet   Commonly known as: NORMODYNE   Take 1 tablet (200 mg total) by mouth 2 (two) times daily.      levothyroxine 200 MCG tablet   Commonly known as: SYNTHROID, LEVOTHROID   Take 1 tablet (200 mcg total) by mouth daily. -Take medication on an empty stomach and at least 40 minutes away from any other medications.      omega-3 acid ethyl esters 1 G capsule   Commonly known as: LOVAZA   Take 4 g by mouth daily.      oxyCODONE 15 MG Tb12   Commonly known as: OXYCONTIN   Take 15 mg by mouth as needed.      pravastatin 40 MG tablet   Commonly known as: PRAVACHOL   Take 1 tablet (40 mg total) by mouth daily.      QUEtiapine 300 MG 24 hr tablet   Commonly known as: SEROQUEL XR   Take 300 mg  by mouth at bedtime.      risperiDONE 1 MG tablet   Commonly known as: RISPERDAL   Take 1 mg by mouth at bedtime.      triamterene-hydrochlorothiazide 37.5-25 MG per capsule   Commonly known as: DYAZIDE   Take 1 each (1 capsule total) by mouth every morning.            Disposition and Follow-up:  Patient has been discharged in a stable and improved condition; currently without any chest pain, headache, vision changes or nausea/vomiting. Patient has been started on triamterene and labetalol; and also advised to follow a low-sodium heart healthy diet. Patient will follow with his PCP over the next 2 weeks in order to follow BP, BMET (for electrolytes and renal function) and for further medication adjustments. Patient TSH was in the 18th range reason why his Synthroid medication has been adjusted; he will require thyroid function followup and further adjustment. His LDL was also elevated reason why he has been now started on Pravachol.  Patient was advised to follow a low-fat diet and to continue using his Lovaza and Tricor.   Consults:   None   Significant Diagnostic Studies:  Dg Chest 2 View  10/04/2011  *RADIOLOGY REPORT*  Clinical Data: Chest pain, hypertension, dizziness  CHEST - 2 VIEW  Comparison: 10/12/2007  Findings: Upper-normal size of cardiac silhouette. Mediastinal contours and pulmonary vascularity normal. Lungs clear. No pleural effusion or pneumothorax. Question prior thyroidectomy. No acute osseous findings.  IMPRESSION: No acute abnormalities.  Original Report Authenticated By: Lollie Marrow, M.D.    Brief H and P: 51 year old male with a past medical history significant for Graves' disease, bipolar disorder and hypertension; came into the hospital secondary to strangely elevated blood pressure with a systolic in the 220s, he was having headaches, nausea, vomiting and chest discomfort. In the emergency department patient failed to have improvement of his blood pressure with  the use of hydralazine and clonidine. At that moment [facility] has been called for further evaluation and treatment.  Hospital Course:  1-chest pain (noncardiac in etiology): Most likely secondary to reflux and also malignant hypertension syndrome, especially with the associated nausea/vomiting that he was having. Chest x-rays have demonstrated no abnormalities, 2-D echo was also within normal limits, no telemetry abnormal findings and also negative cardiac enzymes. No ischemia on EKG was appreciated. A son of the patient's symptoms/blood pressure were under control he has never experienced any CP. At this point patient will be discharged with a new medication regimen for his blood pressure, instructions to follow a heart healthy/low sodium diet; and followup with his PCP over the next 2 weeks.  2-malignant hypertension: Workup for secondary causes remote unremarkable. Patient has been started on Triamterene and labetalol twice a day for blood pressure control. He will follow a low-sodium diet and will follow with his PCP for further medication adjustment.  3-Bipolar disorder: Stable and well controlled. He will continue using current home regimen.  4-Graves' disease: Status post thyroid removal with lab work demonstrating hypo-thyroidism; will adjust his Synthroid and he will follow with PCP as an outpatient.  5-hypokalemia: Secondary to volume contraction due to nausea/vomiting. Repleted during this admission and within normal limits at discharge.  6-hyperlipidemia: LDL in the 180s range; patient advised to follow a low fat diet and to use now Pravachol 40 mg by mouth daily.  7-constipation: Most likely associated with his hypothyroidism and narcotics use; thyroid medication has been adjusted and patient placed on Colace twice a day.    Time spent on Discharge: 40 minutes  Signed: Sanvika Cuttino 10/06/2011, 1:56 PM

## 2011-10-06 NOTE — Progress Notes (Signed)
Patient discharged to home, no complaints of pains or discomfort. parents at bedside, discharge instructions follow up appointment given and discussed with  to patient , verbalized understanding. PIV removed no s/s of infiltration  Or swelling. ,

## 2011-10-09 LAB — CATECHOLAMINES, FRACTIONATED, PLASMA: Epinephrine: 109 pg/mL

## 2013-10-07 ENCOUNTER — Other Ambulatory Visit: Payer: Self-pay | Admitting: Internal Medicine

## 2013-10-07 DIAGNOSIS — R1011 Right upper quadrant pain: Secondary | ICD-10-CM

## 2013-10-09 ENCOUNTER — Ambulatory Visit
Admission: RE | Admit: 2013-10-09 | Discharge: 2013-10-09 | Disposition: A | Discharge status: Home / Self Care | Payer: Medicare Other | Source: Ambulatory Visit | Attending: Internal Medicine | Admitting: Internal Medicine

## 2013-10-09 DIAGNOSIS — R1011 Right upper quadrant pain: Secondary | ICD-10-CM

## 2013-12-02 ENCOUNTER — Encounter (INDEPENDENT_AMBULATORY_CARE_PROVIDER_SITE_OTHER): Payer: Self-pay | Admitting: General Surgery

## 2013-12-04 ENCOUNTER — Encounter (INDEPENDENT_AMBULATORY_CARE_PROVIDER_SITE_OTHER): Payer: Self-pay

## 2013-12-04 ENCOUNTER — Ambulatory Visit (INDEPENDENT_AMBULATORY_CARE_PROVIDER_SITE_OTHER): Payer: Medicare Other | Admitting: General Surgery

## 2013-12-04 ENCOUNTER — Encounter (INDEPENDENT_AMBULATORY_CARE_PROVIDER_SITE_OTHER): Payer: Self-pay | Admitting: General Surgery

## 2013-12-04 VITALS — BP 128/82 | HR 76 | Temp 98.5°F | Ht 69.0 in | Wt 184.0 lb

## 2013-12-04 DIAGNOSIS — K8020 Calculus of gallbladder without cholecystitis without obstruction: Secondary | ICD-10-CM

## 2013-12-04 MED ORDER — PROMETHAZINE HCL 12.5 MG PO TABS: 12.5000 mg | ORAL_TABLET | Freq: Four times a day (QID) | ORAL | Status: DC | PRN

## 2013-12-04 NOTE — Patient Instructions (Signed)
Laparoscopic Cholecystectomy °Laparoscopic cholecystectomy is surgery to remove the gallbladder. The gallbladder is located in the upper right part of the abdomen, behind the liver. It is a storage sac for bile produced in the liver. Bile aids in the digestion and absorption of fats. Cholecystectomy is often done for inflammation of the gallbladder (cholecystitis). This condition is usually caused by a buildup of gallstones (cholelithiasis) in your gallbladder. Gallstones can block the flow of bile, resulting in inflammation and pain. In severe cases, emergency surgery may be required. When emergency surgery is not required, you will have time to prepare for the procedure. °Laparoscopic surgery is an alternative to open surgery. Laparoscopic surgery has a shorter recovery time. Your common bile duct may also need to be examined during the procedure. If stones are found in the common bile duct, they may be removed. °LET YOUR HEALTH CARE PROVIDER KNOW ABOUT: °· Any allergies you have. °· All medicines you are taking, including vitamins, herbs, eye drops, creams, and over-the-counter medicines. °· Previous problems you or members of your family have had with the use of anesthetics. °· Any blood disorders you have. °· Previous surgeries you have had. °· Medical conditions you have. °RISKS AND COMPLICATIONS °Generally, this is a safe procedure. However, as with any procedure, complications can occur. Possible complications include: °· Infection. °· Damage to the common bile duct, nerves, arteries, veins, or other internal organs such as the stomach, liver, or intestines. °· Bleeding. °· A stone may remain in the common bile duct. °· A bile leak from the cyst duct that is clipped when your gallbladder is removed. °· The need to convert to open surgery, which requires a larger incision in the abdomen. This may be necessary if your surgeon thinks it is not safe to continue with a laparoscopic procedure. °BEFORE THE  PROCEDURE °· Ask your health care provider about changing or stopping any regular medicines. You will need to stop taking aspirin or blood thinners at least 5 days prior to surgery. °· Do not eat or drink anything after midnight the night before surgery. °· Let your health care provider know if you develop a cold or other infectious problem before surgery. °PROCEDURE  °· You will be given medicine to make you sleep through the procedure (general anesthetic). A breathing tube will be placed in your mouth. °· When you are asleep, your surgeon will make several small cuts (incisions) in your abdomen. °· A thin, lighted tube with a tiny camera on the end (laparoscope) is inserted through one of the small incisions. The camera on the laparoscope sends a picture to a TV screen in the operating room. This gives the surgeon a good view inside your abdomen. °· A gas will be pumped into your abdomen. This expands your abdomen so that the surgeon has more room to perform the surgery. °· Other tools needed for the procedure are inserted through the other incisions. The gallbladder is removed through one of the incisions. °· After the removal of your gallbladder, the incisions will be closed with stitches, staples, or skin glue. °AFTER THE PROCEDURE °· You will be taken to a recovery area where your progress will be checked often. °· You may be allowed to go home the same day if your pain is controlled and you can tolerate liquids. °Document Released: 08/21/2005 Document Revised: 06/11/2013 Document Reviewed: 04/02/2013 °[employer]® Patient Information ©2014 [employer]. ° °

## 2013-12-04 NOTE — Progress Notes (Signed)
Patient ID: [name], male   DOB: 28-Jun-1961, 53 y.o.   MRN: [id number]  Chief Complaint  Patient presents with  . eval gallbladder    HPI Connor AlbeeGrant M James is a 53 y.o. male.   HPI 53 year old Caucasian male referred by Dr. Pete GlatterStoneking for evaluation of abdominal pain. The patient states he's been having right upper quarter and abdominal pain for about 3-4 months. It initially was intermittent now the pain is almost on a daily basis. It is sharp in intensity. It does radiate to his back and shoulder. It's more of an ache in his back. He does also have daily nausea. He has occasional vomiting. He denies any fever or chills. He noticed that if he eats spicy foods the discomfort will be worsened. He denies any melena or hematochezia. He denies any acholic stools. He has bowel movements about every 3 days. He felt bloated after he eats. He does take chronic narcotics for musculoskeletal pain. Phenergan has helped with the nausea.  Past Medical History  Diagnosis Date  . Bipolar 1 disorder   . Grave's disease   . Hypertension   . Hiatal hernia   . Depression   . Hyperlipidemia     Past Surgical History  Procedure Laterality Date  . Cardiac catheterization    . Testicular torsion surgery    . Thyroidectomy    . Knee surgery      Family History  Problem Relation Age of Onset  . Hypertension Mother   . Hypertension Father   . Cancer Other   . Hypertension Other     Social History History  Substance Use Topics  . Smoking status: Current Every Day Smoker -- 0.50 packs/day    Types: Cigarettes  . Smokeless tobacco: Never Used  . Alcohol Use: No    No Known Allergies  Current Outpatient Prescriptions  Medication Sig Dispense Refill  . buPROPion (WELLBUTRIN SR) 150 MG 12 hr tablet Take 300 mg by mouth daily.       . citalopram (CELEXA) 20 MG tablet Take 20 mg by mouth daily.       . diazepam (VALIUM) 5 MG tablet Take 5 mg by mouth every 8 (eight) hours as needed. Anxiety       . doxazosin (CARDURA) 8 MG tablet Take 8 mg by mouth at bedtime.      . fenofibrate (TRICOR) 145 MG tablet Take 145 mg by mouth daily.      Marland Kitchen. gemfibrozil (LOPID) 600 MG tablet Take 600 mg by mouth 2 (two) times daily before a meal.      . hydrALAZINE (APRESOLINE) 50 MG tablet       . levothyroxine (SYNTHROID, LEVOTHROID) 200 MCG tablet Take 1 tablet (200 mcg total) by mouth daily. -Take medication on an empty stomach and at least 40 minutes away from any other medications.  30 tablet  1  . nitroGLYCERIN (NITROSTAT) 0.4 MG SL tablet Place 0.4 mg under the tongue every 5 (five) minutes as needed for chest pain.      Marland Kitchen. omega-3 acid ethyl esters (LOVAZA) 1 G capsule Take 4 g by mouth daily.      . OxyCODONE HCl ER 30 MG T12A Take by mouth.      . QUEtiapine (SEROQUEL XR) 300 MG 24 hr tablet Take 300 mg by mouth at bedtime.      . risperiDONE (RISPERDAL) 1 MG tablet Take 1 mg by mouth at bedtime.       Marland Kitchen. labetalol (  NORMODYNE) 200 MG tablet Take 1 tablet (200 mg total) by mouth 2 (two) times daily.  60 tablet  1  . pravastatin (PRAVACHOL) 40 MG tablet Take 1 tablet (40 mg total) by mouth daily.  30 tablet  1  . promethazine (PHENERGAN) 12.5 MG tablet Take 1 tablet (12.5 mg total) by mouth every 6 (six) hours as needed for nausea or vomiting.  30 tablet  0  . triamterene-hydrochlorothiazide (DYAZIDE) 37.5-25 MG per capsule Take 1 each (1 capsule total) by mouth every morning.  30 capsule  1   No current facility-administered medications for this visit.    Review of Systems Review of Systems  Constitutional: Negative for fever, chills, appetite change and unexpected weight change.  HENT: Negative for congestion and trouble swallowing.   Eyes: Negative for visual disturbance.  Respiratory: Negative for chest tightness and shortness of breath.   Cardiovascular: Negative for chest pain (reports some angina) and leg swelling.       No PND, no orthopnea, some DOE  Gastrointestinal: Positive for  nausea, vomiting, abdominal pain and constipation. Negative for diarrhea.       See HPI  Genitourinary: Negative for dysuria and hematuria.  Musculoskeletal: Positive for back pain and neck pain.       On chronic narcotics for MSKL pain  Skin: Negative for rash.  Neurological: Negative for seizures and speech difficulty.  Hematological: Does not bruise/bleed easily.  Psychiatric/Behavioral: Negative for behavioral problems and confusion.    Blood pressure 128/82, pulse 76, temperature 98.5 F (36.9 C), temperature source Oral, height 5\' 9"  (1.753 m), weight 184 lb (83.462 kg).  Physical Exam Physical Exam  Constitutional: He is oriented to person, place, and time. He appears well-developed and well-nourished. No distress.  HENT:  Head: Normocephalic and atraumatic.  Right Ear: External ear normal.  Left Ear: External ear normal.  Eyes: Conjunctivae are normal. No scleral icterus.  Neck: Normal range of motion. Neck supple. No tracheal deviation present. No thyromegaly present.  Cardiovascular: Normal rate, normal heart sounds and intact distal pulses.   Pulmonary/Chest: Effort normal and breath sounds normal. No respiratory distress. He has no wheezes.  Abdominal: Soft. He exhibits no distension. Tenderness: mild RUQ TTP. There is no rebound and no guarding.  Small fascial defect at umbilicus -<0.5cm  Musculoskeletal: Normal range of motion. He exhibits no edema and no tenderness.  Lymphadenopathy:    He has no cervical adenopathy.  Neurological: He is alert and oriented to person, place, and time. He exhibits normal muscle tone.  Skin: Skin is warm and dry. No rash noted. He is not diaphoretic. No erythema. No pallor.  Psychiatric: He has a normal mood and affect. His behavior is normal. Judgment and thought content normal.    Data Reviewed Dr Pete Glatter office note Abd [place] 2/15 - multiple gallstones, fatty liver 2013 Hospital discharge summary for malignant hypertension-had  negative cardiac workup. Essentially normal echocardiogram Assessment    Symptomatic cholelithiasis     Plan    I believe the patient's symptoms are consistent with gallbladder disease.  We discussed gallbladder disease. The patient was given Agricultural engineer. We discussed non-operative and operative management. We discussed the signs & symptoms of acute cholecystitis  I discussed laparoscopic cholecystectomy with IOC in detail.  The patient was given educational material as well as diagrams detailing the procedure.  We discussed the risks and benefits of a laparoscopic cholecystectomy including, but not limited to bleeding, infection, injury to surrounding structures such as the intestine  or liver, bile leak, retained gallstones, need to convert to an open procedure, prolonged diarrhea, blood clots such as  DVT, common bile duct injury, anesthesia risks, and possible need for additional procedures.  We discussed the typical post-operative recovery course. I explained that the likelihood of improvement of their symptoms is good.  I did give him a prescription for Phenergan.  He will meet with the scheduler today to schedule surgery  Mary Sella. Andrey Campanile, MD, FACS General, Bariatric, & Minimally Invasive Surgery [facility], [place]         [facility] M 12/04/2013, 10:36 AM

## 2013-12-05 ENCOUNTER — Encounter (HOSPITAL_COMMUNITY): Payer: Self-pay | Admitting: Pharmacy Technician

## 2013-12-08 NOTE — Pre-Procedure Instructions (Signed)
Rosealee AlbeeGrant M Chermak  12/08/2013   Your procedure is scheduled on:    Thursday  12-11-13   [facility]  2 * 3 at 845 AM.  Call this number if you have problems the morning of surgery: [phone]   Remember:   Do not eat food or drink liquids after midnight.   Take these medicines the morning of surgery with A SIP OF WATER:  BUPROPION, LABETALOL, LEVOTHYROXINE, OXYCODONE IF NEEDED   Do not wear jewelry, make-up or nail polish.  Do not wear lotions, powders, or perfumes. You may wear deodorant.  Do not shave 48 hours prior to surgery. Men may shave face and neck.  Do not bring valuables to the hospital.  [facility] is not responsible                  for any belongings or valuables.               Contacts, dentures or bridgework may not be worn into surgery.  Leave suitcase in the car. After surgery it may be brought to your room.  For patients admitted to the hospital, discharge time is determined by your                treatment team.               Patients discharged the day of surgery will not be allowed to drive  home.  Name and phone number of your driver:   Special Instructions:  Special Instructions: [place] - Preparing for Surgery  Before surgery, you can play an important role.  Because skin is not sterile, your skin needs to be as free of germs as possible.  You can reduce the number of germs on you skin by washing with CHG (chlorahexidine gluconate) soap before surgery.  CHG is an antiseptic cleaner which kills germs and bonds with the skin to continue killing germs even after washing.  Please DO NOT use if you have an allergy to CHG or antibacterial soaps.  If your skin becomes reddened/irritated stop using the CHG and inform your nurse when you arrive at Short Stay.  Do not shave (including legs and underarms) for at least 48 hours prior to the first CHG shower.  You may shave your face.  Please follow these instructions carefully:   1.   Shower with CHG Soap the night before surgery and the morning of Surgery.  2.  If you choose to wash your hair, wash your hair first as usual with your normal shampoo.  3.  After you shampoo, rinse your hair and body thoroughly to remove the Shampoo.  4.  Use CHG as you would any other liquid soap. You can apply chg directly to the skin and wash gently with scrungie or a clean washcloth.  5.  Apply the CHG Soap to your body ONLY FROM THE NECK DOWN.  Do not use on open wounds or open sores.  Avoid contact with your eyes, ears, mouth and genitals (private parts).  Wash genitals (private parts with your normal soap.  6.  Wash thoroughly, paying special attention to the area where your surgery will be performed.  7.  Thoroughly rinse your body with warm water from the neck down.  8.  DO NOT shower/wash with your normal soap after using and rinsing off the CHG Soap.  9.  Pat yourself dry with a clean towel.  10.  Wear clean pajamas.            11.  Place clean sheets on your bed the night of your first shower and do not sleep with pets.  Day of Surgery  Do not apply any lotions/deodorants the morning of surgery.  Please wear clean clothes to the hospital/surgery center.   Please read over the following fact sheets that you were given: Pain Booklet, Coughing and Deep Breathing and Surgical Site Infection Prevention

## 2013-12-09 ENCOUNTER — Encounter (HOSPITAL_COMMUNITY): Payer: Self-pay

## 2013-12-09 ENCOUNTER — Encounter (HOSPITAL_COMMUNITY)
Admission: RE | Admit: 2013-12-09 | Discharge: 2013-12-09 | Disposition: A | Discharge status: Home / Self Care | Payer: Medicare Other | Source: Ambulatory Visit | Attending: General Surgery | Admitting: General Surgery

## 2013-12-09 ENCOUNTER — Telehealth (INDEPENDENT_AMBULATORY_CARE_PROVIDER_SITE_OTHER): Payer: Self-pay

## 2013-12-09 HISTORY — DX: Cervical disc disorder with radiculopathy, unspecified cervical region: M50.10

## 2013-12-09 HISTORY — DX: Angina pectoris, unspecified: I20.9

## 2013-12-09 HISTORY — DX: Personal history of other diseases of male genital organs: Z87.438

## 2013-12-09 HISTORY — DX: Gastro-esophageal reflux disease without esophagitis: K21.9

## 2013-12-09 HISTORY — DX: Other intervertebral disc degeneration, lumbar region: M51.36

## 2013-12-09 HISTORY — DX: Unspecified osteoarthritis, unspecified site: M19.90

## 2013-12-09 HISTORY — DX: Other intervertebral disc degeneration, lumbar region without mention of lumbar back pain or lower extremity pain: M51.369

## 2013-12-09 LAB — BASIC METABOLIC PANEL
BUN: 13 mg/dL (ref 6–23)
CALCIUM: 9.6 mg/dL (ref 8.4–10.5)
CO2: 25 mEq/L (ref 19–32)
Chloride: 101 mEq/L (ref 96–112)
Creatinine, Ser: 0.77 mg/dL (ref 0.50–1.35)
GFR calc Af Amer: >90 mL/min (ref >90)
GFR calc non Af Amer: >90 mL/min (ref >90)
GLUCOSE: 82 mg/dL (ref 70–99)
POTASSIUM: 3.2 meq/L — AB (ref 3.7–5.3)
SODIUM: 143 meq/L (ref 137–147)

## 2013-12-09 LAB — CBC
HCT: 38.6 % — ABNORMAL LOW (ref 39.0–52.0)
HEMOGLOBIN: 13.6 g/dL (ref 13.0–17.0)
MCH: 31 pg (ref 26.0–34.0)
MCHC: 35.2 g/dL (ref 30.0–36.0)
MCV: 87.9 fL (ref 78.0–100.0)
Platelets: 472 10*3/uL — ABNORMAL HIGH (ref 150–400)
RBC: 4.39 MIL/uL (ref 4.22–5.81)
RDW: 12.9 % (ref 11.5–15.5)
WBC: 10.6 10*3/uL — ABNORMAL HIGH (ref 4.0–10.5)

## 2013-12-09 NOTE — Telephone Encounter (Signed)
[facility] preadmission is requesting epic orders. The pt is scheduled for lap chole on 12/11/13 by Dr Andrey CampanileWilson.

## 2013-12-09 NOTE — Progress Notes (Signed)
STOP-Bang score 5; results faxed to PCP, Dr Pete GlatterStoneking @ [facility]

## 2013-12-10 ENCOUNTER — Other Ambulatory Visit (INDEPENDENT_AMBULATORY_CARE_PROVIDER_SITE_OTHER): Payer: Self-pay | Admitting: General Surgery

## 2013-12-10 MED ORDER — DEXTROSE 5 % IV SOLN
2.0000 g | INTRAVENOUS | Status: AC
  Administered 2013-12-11: 2 g via INTRAVENOUS
  Filled 2013-12-10: qty 2

## 2013-12-11 ENCOUNTER — Ambulatory Visit (HOSPITAL_COMMUNITY): Payer: Medicare Other | Admitting: Certified Registered"

## 2013-12-11 ENCOUNTER — Encounter (HOSPITAL_COMMUNITY): Payer: Self-pay | Admitting: *Deleted

## 2013-12-11 ENCOUNTER — Encounter (HOSPITAL_COMMUNITY): Payer: Medicare Other | Admitting: Certified Registered"

## 2013-12-11 ENCOUNTER — Ambulatory Visit (HOSPITAL_COMMUNITY): Payer: Medicare Other

## 2013-12-11 ENCOUNTER — Encounter (HOSPITAL_COMMUNITY)
Admission: RE | Disposition: A | Discharge status: Home / Self Care | Payer: Self-pay | Source: Ambulatory Visit | Attending: General Surgery

## 2013-12-11 ENCOUNTER — Observation Stay (HOSPITAL_COMMUNITY)
Admission: RE | Admit: 2013-12-11 | Discharge: 2013-12-12 | Disposition: A | Discharge status: Home / Self Care | Payer: Medicare Other | Source: Ambulatory Visit | Attending: General Surgery | Admitting: General Surgery

## 2013-12-11 DIAGNOSIS — K8010 Calculus of gallbladder with chronic cholecystitis without obstruction: Secondary | ICD-10-CM

## 2013-12-11 DIAGNOSIS — E0500 Thyrotoxicosis with diffuse goiter without thyrotoxic crisis or storm: Secondary | ICD-10-CM | POA: Diagnosis present

## 2013-12-11 DIAGNOSIS — K219 Gastro-esophageal reflux disease without esophagitis: Secondary | ICD-10-CM | POA: Insufficient documentation

## 2013-12-11 DIAGNOSIS — F17200 Nicotine dependence, unspecified, uncomplicated: Secondary | ICD-10-CM | POA: Insufficient documentation

## 2013-12-11 DIAGNOSIS — F319 Bipolar disorder, unspecified: Secondary | ICD-10-CM | POA: Diagnosis present

## 2013-12-11 DIAGNOSIS — IMO0001 Reserved for inherently not codable concepts without codable children: Secondary | ICD-10-CM | POA: Insufficient documentation

## 2013-12-11 DIAGNOSIS — K449 Diaphragmatic hernia without obstruction or gangrene: Secondary | ICD-10-CM | POA: Insufficient documentation

## 2013-12-11 DIAGNOSIS — I10 Essential (primary) hypertension: Secondary | ICD-10-CM | POA: Diagnosis present

## 2013-12-11 DIAGNOSIS — E785 Hyperlipidemia, unspecified: Secondary | ICD-10-CM | POA: Insufficient documentation

## 2013-12-11 DIAGNOSIS — Z9049 Acquired absence of other specified parts of digestive tract: Secondary | ICD-10-CM

## 2013-12-11 DIAGNOSIS — F411 Generalized anxiety disorder: Secondary | ICD-10-CM | POA: Insufficient documentation

## 2013-12-11 DIAGNOSIS — Z01812 Encounter for preprocedural laboratory examination: Secondary | ICD-10-CM | POA: Insufficient documentation

## 2013-12-11 DIAGNOSIS — K8020 Calculus of gallbladder without cholecystitis without obstruction: Secondary | ICD-10-CM | POA: Diagnosis present

## 2013-12-11 DIAGNOSIS — Z01810 Encounter for preprocedural cardiovascular examination: Secondary | ICD-10-CM | POA: Insufficient documentation

## 2013-12-11 HISTORY — PX: CHOLECYSTECTOMY: SHX55

## 2013-12-11 SURGERY — LAPAROSCOPIC CHOLECYSTECTOMY WITH INTRAOPERATIVE CHOLANGIOGRAM
Anesthesia: General | Site: Abdomen

## 2013-12-11 MED ORDER — ENOXAPARIN SODIUM 40 MG/0.4ML ~~LOC~~ SOLN
40.0000 mg | SUBCUTANEOUS | Status: DC
  Administered 2013-12-12: 40 mg via SUBCUTANEOUS
  Filled 2013-12-11 (×2): qty 0.4

## 2013-12-11 MED ORDER — OXYCODONE HCL ER 30 MG PO T12A: 1.0000 | EXTENDED_RELEASE_TABLET | Freq: Three times a day (TID) | ORAL | Status: DC | PRN

## 2013-12-11 MED ORDER — HYDRALAZINE HCL 20 MG/ML IJ SOLN
INTRAMUSCULAR | Status: AC
  Filled 2013-12-11: qty 1

## 2013-12-11 MED ORDER — 0.9 % SODIUM CHLORIDE (POUR BTL) OPTIME
TOPICAL | Status: DC | PRN
  Administered 2013-12-11: 1000 mL

## 2013-12-11 MED ORDER — HYDROMORPHONE HCL PF 1 MG/ML IJ SOLN
INTRAMUSCULAR | Status: AC
  Filled 2013-12-11: qty 1

## 2013-12-11 MED ORDER — HYDRALAZINE HCL 20 MG/ML IJ SOLN
5.0000 mg | Freq: Once | INTRAMUSCULAR | Status: AC
  Administered 2013-12-11: 5 mg via INTRAVENOUS

## 2013-12-11 MED ORDER — OXYCODONE-ACETAMINOPHEN 5-325 MG PO TABS
1.0000 | ORAL_TABLET | ORAL | Status: DC | PRN
  Administered 2013-12-11 – 2013-12-12 (×2): 2 via ORAL
  Filled 2013-12-11 (×2): qty 2

## 2013-12-11 MED ORDER — LIDOCAINE HCL (CARDIAC) 20 MG/ML IV SOLN
INTRAVENOUS | Status: DC | PRN
  Administered 2013-12-11: 80 mg via INTRAVENOUS

## 2013-12-11 MED ORDER — PROMETHAZINE HCL 25 MG PO TABS: 12.5000 mg | ORAL_TABLET | Freq: Four times a day (QID) | ORAL | Status: DC | PRN

## 2013-12-11 MED ORDER — BUPIVACAINE-EPINEPHRINE 0.25% -1:200000 IJ SOLN
INTRAMUSCULAR | Status: DC | PRN
  Administered 2013-12-11: 27 mL

## 2013-12-11 MED ORDER — GLYCOPYRROLATE 0.2 MG/ML IJ SOLN
INTRAMUSCULAR | Status: AC
  Filled 2013-12-11: qty 3

## 2013-12-11 MED ORDER — KCL IN DEXTROSE-NACL 20-5-0.45 MEQ/L-%-% IV SOLN
INTRAVENOUS | Status: DC
  Administered 2013-12-11: 50 mL/h via INTRAVENOUS
  Administered 2013-12-12: 07:00:00 via INTRAVENOUS
  Filled 2013-12-11 (×3): qty 1000

## 2013-12-11 MED ORDER — PROPOFOL 10 MG/ML IV BOLUS
INTRAVENOUS | Status: DC | PRN
  Administered 2013-12-11: 200 mg via INTRAVENOUS

## 2013-12-11 MED ORDER — ROCURONIUM BROMIDE 50 MG/5ML IV SOLN
INTRAVENOUS | Status: AC
  Filled 2013-12-11: qty 1

## 2013-12-11 MED ORDER — SODIUM CHLORIDE 0.9 % IR SOLN
Status: DC | PRN
  Administered 2013-12-11: 1000 mL

## 2013-12-11 MED ORDER — OXYCODONE HCL 5 MG/5ML PO SOLN: 5.0000 mg | Freq: Once | ORAL | Status: AC | PRN

## 2013-12-11 MED ORDER — LABETALOL HCL 200 MG PO TABS
200.0000 mg | ORAL_TABLET | Freq: Two times a day (BID) | ORAL | Status: DC
  Administered 2013-12-11 – 2013-12-12 (×2): 200 mg via ORAL
  Filled 2013-12-11 (×3): qty 1

## 2013-12-11 MED ORDER — KCL IN DEXTROSE-NACL 20-5-0.45 MEQ/L-%-% IV SOLN
INTRAVENOUS | Status: AC
  Filled 2013-12-11: qty 1000

## 2013-12-11 MED ORDER — HYDRALAZINE HCL 50 MG PO TABS
100.0000 mg | ORAL_TABLET | Freq: Two times a day (BID) | ORAL | Status: DC
  Administered 2013-12-11 – 2013-12-12 (×3): 100 mg via ORAL
  Filled 2013-12-11 (×3): qty 2
  Filled 2013-12-11: qty 4
  Filled 2013-12-11: qty 2

## 2013-12-11 MED ORDER — PROMETHAZINE HCL 25 MG/ML IJ SOLN: 6.2500 mg | INTRAMUSCULAR | Status: DC | PRN

## 2013-12-11 MED ORDER — LABETALOL HCL 5 MG/ML IV SOLN
INTRAVENOUS | Status: DC | PRN
  Administered 2013-12-11 (×2): 5 mg via INTRAVENOUS

## 2013-12-11 MED ORDER — OXYCODONE HCL 5 MG PO TABS
5.0000 mg | ORAL_TABLET | Freq: Once | ORAL | Status: AC | PRN
  Administered 2013-12-11: 5 mg via ORAL

## 2013-12-11 MED ORDER — PHENYLEPHRINE 40 MCG/ML (10ML) SYRINGE FOR IV PUSH (FOR BLOOD PRESSURE SUPPORT)
PREFILLED_SYRINGE | INTRAVENOUS | Status: AC
  Filled 2013-12-11: qty 10

## 2013-12-11 MED ORDER — FENTANYL CITRATE 0.05 MG/ML IJ SOLN
INTRAMUSCULAR | Status: AC
  Filled 2013-12-11: qty 5

## 2013-12-11 MED ORDER — LEVOTHYROXINE SODIUM 200 MCG PO TABS
200.0000 ug | ORAL_TABLET | Freq: Every day | ORAL | Status: DC
  Administered 2013-12-12: 200 ug via ORAL
  Filled 2013-12-11 (×2): qty 1

## 2013-12-11 MED ORDER — NEOSTIGMINE METHYLSULFATE 1 MG/ML IJ SOLN
INTRAMUSCULAR | Status: AC
  Filled 2013-12-11: qty 10

## 2013-12-11 MED ORDER — PROPOFOL 10 MG/ML IV BOLUS
INTRAVENOUS | Status: AC
  Filled 2013-12-11: qty 20

## 2013-12-11 MED ORDER — EPHEDRINE SULFATE 50 MG/ML IJ SOLN
INTRAMUSCULAR | Status: AC
  Filled 2013-12-11: qty 1

## 2013-12-11 MED ORDER — SODIUM CHLORIDE 0.9 % IV SOLN
INTRAVENOUS | Status: DC | PRN
  Administered 2013-12-11: 11:00:00

## 2013-12-11 MED ORDER — DIAZEPAM 5 MG PO TABS: 5.0000 mg | ORAL_TABLET | Freq: Three times a day (TID) | ORAL | Status: DC | PRN

## 2013-12-11 MED ORDER — METOPROLOL TARTRATE 1 MG/ML IV SOLN
5.0000 mg | INTRAVENOUS | Status: DC | PRN
  Administered 2013-12-11: 5 mg via INTRAVENOUS
  Filled 2013-12-11: qty 5

## 2013-12-11 MED ORDER — ONDANSETRON HCL 4 MG/2ML IJ SOLN
4.0000 mg | Freq: Four times a day (QID) | INTRAMUSCULAR | Status: DC | PRN
  Administered 2013-12-12: 4 mg via INTRAVENOUS
  Filled 2013-12-11: qty 2

## 2013-12-11 MED ORDER — FENTANYL CITRATE 0.05 MG/ML IJ SOLN
INTRAMUSCULAR | Status: DC | PRN
  Administered 2013-12-11: 100 ug via INTRAVENOUS
  Administered 2013-12-11 (×8): 50 ug via INTRAVENOUS

## 2013-12-11 MED ORDER — NEOSTIGMINE METHYLSULFATE 1 MG/ML IJ SOLN
INTRAMUSCULAR | Status: DC | PRN
  Administered 2013-12-11: 4 mg via INTRAVENOUS

## 2013-12-11 MED ORDER — ONDANSETRON HCL 4 MG PO TABS: 4.0000 mg | ORAL_TABLET | Freq: Four times a day (QID) | ORAL | Status: DC | PRN

## 2013-12-11 MED ORDER — BUPIVACAINE-EPINEPHRINE (PF) 0.25% -1:200000 IJ SOLN
INTRAMUSCULAR | Status: AC
  Filled 2013-12-11: qty 30

## 2013-12-11 MED ORDER — OXYCODONE HCL 5 MG PO TABS
ORAL_TABLET | ORAL | Status: AC
  Filled 2013-12-11: qty 1

## 2013-12-11 MED ORDER — MIDAZOLAM HCL 2 MG/2ML IJ SOLN
INTRAMUSCULAR | Status: AC
  Filled 2013-12-11: qty 2

## 2013-12-11 MED ORDER — ROCURONIUM BROMIDE 100 MG/10ML IV SOLN
INTRAVENOUS | Status: DC | PRN
  Administered 2013-12-11: 50 mg via INTRAVENOUS

## 2013-12-11 MED ORDER — ONDANSETRON HCL 4 MG/2ML IJ SOLN
INTRAMUSCULAR | Status: DC | PRN
  Administered 2013-12-11: 4 mg via INTRAVENOUS

## 2013-12-11 MED ORDER — BUPROPION HCL ER (SR) 150 MG PO TB12
300.0000 mg | ORAL_TABLET | Freq: Every day | ORAL | Status: DC
  Administered 2013-12-11 – 2013-12-12 (×2): 300 mg via ORAL
  Filled 2013-12-11 (×2): qty 2

## 2013-12-11 MED ORDER — MORPHINE SULFATE 2 MG/ML IJ SOLN
1.0000 mg | INTRAMUSCULAR | Status: DC | PRN
  Administered 2013-12-11: 2 mg via INTRAVENOUS
  Administered 2013-12-12: 4 mg via INTRAVENOUS
  Filled 2013-12-11 (×4): qty 1

## 2013-12-11 MED ORDER — MIDAZOLAM HCL 5 MG/5ML IJ SOLN
INTRAMUSCULAR | Status: DC | PRN
  Administered 2013-12-11: 2 mg via INTRAVENOUS

## 2013-12-11 MED ORDER — ARTIFICIAL TEARS OP OINT
TOPICAL_OINTMENT | OPHTHALMIC | Status: DC | PRN
  Administered 2013-12-11: 1 via OPHTHALMIC

## 2013-12-11 MED ORDER — LIDOCAINE HCL (CARDIAC) 20 MG/ML IV SOLN
INTRAVENOUS | Status: AC
  Filled 2013-12-11: qty 5

## 2013-12-11 MED ORDER — QUETIAPINE FUMARATE 300 MG PO TABS
300.0000 mg | ORAL_TABLET | Freq: Every day | ORAL | Status: DC
  Administered 2013-12-11: 300 mg via ORAL
  Filled 2013-12-11 (×2): qty 1

## 2013-12-11 MED ORDER — LACTATED RINGERS IV SOLN
INTRAVENOUS | Status: DC | PRN
  Administered 2013-12-11: 10:00:00 via INTRAVENOUS

## 2013-12-11 MED ORDER — GLYCOPYRROLATE 0.2 MG/ML IJ SOLN
INTRAMUSCULAR | Status: DC | PRN
  Administered 2013-12-11: 0.6 mg via INTRAVENOUS

## 2013-12-11 MED ORDER — LACTATED RINGERS IV SOLN
INTRAVENOUS | Status: DC
  Administered 2013-12-11: 09:00:00 via INTRAVENOUS

## 2013-12-11 MED ORDER — HYDROMORPHONE HCL PF 1 MG/ML IJ SOLN
0.2500 mg | INTRAMUSCULAR | Status: DC | PRN
  Administered 2013-12-11 (×4): 0.5 mg via INTRAVENOUS

## 2013-12-11 SURGICAL SUPPLY — 52 items
ADH SKN CLS LQ APL DERMABOND (GAUZE/BANDAGES/DRESSINGS) ×1
APL SKNCLS STERI-STRIP NONHPOA (GAUZE/BANDAGES/DRESSINGS) ×1
APPLIER CLIP 5 13 M/L LIGAMAX5 (MISCELLANEOUS) ×2
APR CLP MED LRG 5 ANG JAW (MISCELLANEOUS) ×1
BAG SPEC RTRVL LRG 6X4 10 (ENDOMECHANICALS) ×1
BANDAGE ADHESIVE 1X3 (GAUZE/BANDAGES/DRESSINGS) ×6 IMPLANT
BENZOIN TINCTURE PRP APPL 2/3 (GAUZE/BANDAGES/DRESSINGS) ×2 IMPLANT
BLADE SURG ROTATE 9660 (MISCELLANEOUS) ×1 IMPLANT
CANISTER SUCTION 2500CC (MISCELLANEOUS) ×2 IMPLANT
CHLORAPREP W/TINT 26ML (MISCELLANEOUS) ×2 IMPLANT
CLIP APPLIE 5 13 M/L LIGAMAX5 (MISCELLANEOUS) ×1 IMPLANT
COVER MAYO STAND STRL (DRAPES) ×2 IMPLANT
COVER SURGICAL LIGHT HANDLE (MISCELLANEOUS) ×2 IMPLANT
DECANTER SPIKE VIAL GLASS SM (MISCELLANEOUS) ×2 IMPLANT
DERMABOND ADHESIVE PROPEN (GAUZE/BANDAGES/DRESSINGS) ×1
DERMABOND ADVANCED .7 DNX6 (GAUZE/BANDAGES/DRESSINGS) IMPLANT
DRAPE C-ARM 42X72 X-RAY (DRAPES) ×2 IMPLANT
DRAPE UTILITY 15X26 W/TAPE STR (DRAPE) ×4 IMPLANT
DRSG TEGADERM 4X4.75 (GAUZE/BANDAGES/DRESSINGS) ×2 IMPLANT
ELECT REM PT RETURN 9FT ADLT (ELECTROSURGICAL) ×2
ELECTRODE REM PT RTRN 9FT ADLT (ELECTROSURGICAL) ×1 IMPLANT
GAUZE SPONGE 2X2 8PLY STRL LF (GAUZE/BANDAGES/DRESSINGS) ×1 IMPLANT
GLOVE BIOGEL M STRL SZ7.5 (GLOVE) ×2 IMPLANT
GLOVE BIOGEL PI IND STRL 7.0 (GLOVE) IMPLANT
GLOVE BIOGEL PI IND STRL 7.5 (GLOVE) IMPLANT
GLOVE BIOGEL PI IND STRL 8 (GLOVE) ×1 IMPLANT
GLOVE BIOGEL PI INDICATOR 7.0 (GLOVE) ×2
GLOVE BIOGEL PI INDICATOR 7.5 (GLOVE) ×1
GLOVE BIOGEL PI INDICATOR 8 (GLOVE) ×1
GLOVE ECLIPSE 7.5 STRL STRAW (GLOVE) ×1 IMPLANT
GLOVE SURG SS PI 6.5 STRL IVOR (GLOVE) ×1 IMPLANT
GOWN STRL REUS W/ TWL LRG LVL3 (GOWN DISPOSABLE) ×3 IMPLANT
GOWN STRL REUS W/ TWL XL LVL3 (GOWN DISPOSABLE) ×1 IMPLANT
GOWN STRL REUS W/TWL LRG LVL3 (GOWN DISPOSABLE) ×6
GOWN STRL REUS W/TWL XL LVL3 (GOWN DISPOSABLE) ×2
KIT BASIN OR (CUSTOM PROCEDURE TRAY) ×2 IMPLANT
KIT ROOM TURNOVER OR (KITS) ×2 IMPLANT
NS IRRIG 1000ML POUR BTL (IV SOLUTION) ×2 IMPLANT
PAD ARMBOARD 7.5X6 YLW CONV (MISCELLANEOUS) ×2 IMPLANT
POUCH SPECIMEN RETRIEVAL 10MM (ENDOMECHANICALS) ×2 IMPLANT
SCISSORS LAP 5X35 DISP (ENDOMECHANICALS) ×2 IMPLANT
SET CHOLANGIOGRAPH 5 50 .035 (SET/KITS/TRAYS/PACK) ×2 IMPLANT
SET IRRIG TUBING LAPAROSCOPIC (IRRIGATION / IRRIGATOR) ×2 IMPLANT
SLEEVE ENDOPATH XCEL 5M (ENDOMECHANICALS) ×4 IMPLANT
SPECIMEN JAR SMALL (MISCELLANEOUS) ×2 IMPLANT
SPONGE GAUZE 2X2 STER 10/PKG (GAUZE/BANDAGES/DRESSINGS) ×1
SUT MNCRL AB 4-0 PS2 18 (SUTURE) ×2 IMPLANT
TOWEL OR 17X24 6PK STRL BLUE (TOWEL DISPOSABLE) ×2 IMPLANT
TOWEL OR 17X26 10 PK STRL BLUE (TOWEL DISPOSABLE) ×2 IMPLANT
TRAY LAPAROSCOPIC (CUSTOM PROCEDURE TRAY) ×2 IMPLANT
TROCAR XCEL BLUNT TIP 100MML (ENDOMECHANICALS) ×2 IMPLANT
TROCAR XCEL NON-BLD 5MMX100MML (ENDOMECHANICALS) ×2 IMPLANT

## 2013-12-11 NOTE — Progress Notes (Signed)
NURSING PROGRESS NOTE  [name] [id number] Admission Data: 12/11/2013 8:21 PM Attending Provider: Atilano InaEric M Wilson, MD WUJ:[id number],NFAPCP:STONEKING,HAL Maisie FusHOMAS, MD Code Status:full  [name] is a [age] y.o. male patient admitted from ED:  -No acute distress noted.  -No complaints of shortness of breath.  -No complaints of chest pain.   Cardiac Monitoring:   Blood pressure 184/90, pulse 113, temperature 98.2 F (36.8 C), temperature source Oral, resp. rate 19, height 5\' 9"  (1.753 m), weight 85.462 kg (188 lb 6.6 oz), SpO2 96.00%.   IV Fluids:  IV in place, occlusive dsg intact without redness, IV cath hand right, condition patent and no redness normal saline.   Allergies:  Bee venom  Past Medical History:   has a past medical history of Bipolar 1 disorder; Grave's disease; Hypertension; Hiatal hernia; Depression; Hyperlipidemia; H/O prostatitis; Arthritis; Cervical disc syndrome; Lumbar degenerative disc disease; GERD (gastroesophageal reflux disease); and Anginal pain (2000).  Past Surgical History:   has past surgical history that includes testicular torsion surgery; Thyroidectomy; Knee surgery; and Cardiac catheterization (2000).  Social History:   reports that he has been smoking Cigarettes.  He has a 8.75 pack-year smoking history. He has never used smokeless tobacco. He reports that he does not drink alcohol or use illicit drugs.  Skin: intact with 5 dermabond sites  Patient/Family orientated to room. Information packet given to patient/family. Admission inpatient armband information verified with patient/family to include name and date of birth and placed on patient arm. Side rails up x 2, fall assessment and education completed with patient/family. Patient/family able to verbalize understanding of risk associated with falls and verbalized understanding to call for assistance before getting out of bed. Call light within reach. Patient/family able to voice and demonstrate understanding of  unit orientation instructions.    Will continue to evaluate and treat per MD orders.    NURSING PROGRESS NOTE  [name] [id number] Admission Data: 12/11/2013 8:21 PM Attending Provider: Atilano InaEric M Wilson, MD ION:[id number],KGMPCP:STONEKING,HAL Maisie FusHOMAS, MD Code Status: Full  [name] is a 53 y.o. male patient admitted from ED:  -No acute distress noted.  -No complaints of shortness of breath.  -No complaints of chest pain.   Cardiac Monitoring:NA   Blood pressure 184/90, pulse 113, temperature 98.2 F (36.8 C), temperature source Oral, resp. rate 19, height 5\' 9"  (1.753 m), weight 85.462 kg (188 lb 6.6 oz), SpO2 96.00%.   IV Fluids:  IV in place, occlusive dsg intact without redness, IV cath hand right, condition patent and no redness   Allergies:  Bee venom  Past Medical History:   has a past medical history of Bipolar 1 disorder; Grave's disease; Hypertension; Hiatal hernia; Depression; Hyperlipidemia; H/O prostatitis; Arthritis; Cervical disc syndrome; Lumbar degenerative disc disease; GERD (gastroesophageal reflux disease); and Anginal pain (2000).  Past Surgical History:   has past surgical history that includes testicular torsion surgery; Thyroidectomy; Knee surgery; and Cardiac catheterization (2000).  Social History:   reports that he has been smoking Cigarettes.  He has a 8.75 pack-year smoking history. He has never used smokeless tobacco. He reports that he does not drink alcohol or use illicit drugs.  Skin: skin intact with 5 dermabond sites  Patient/Family orientated to room. Information packet given to patient/family. Admission inpatient armband information verified with patient/family to include name and date of birth and placed on patient arm. Side rails up x 2, fall assessment and education completed with patient/family. Patient/family able to verbalize understanding of risk associated with  falls and verbalized understanding to call for assistance before getting out of bed.  Call light within reach. Patient/family able to voice and demonstrate understanding of unit orientation instructions.    Will continue to evaluate and treat per MD orders.

## 2013-12-11 NOTE — Anesthesia Procedure Notes (Signed)
Procedure Name: Intubation Date/Time: 12/11/2013 10:25 AM Performed by: Ellin GoodieWEAVER, Eberardo Demello M Pre-anesthesia Checklist: Patient identified, Emergency Drugs available, Suction available, Patient being monitored and Timeout performed Patient Re-evaluated:Patient Re-evaluated prior to inductionOxygen Delivery Method: Circle system utilized Preoxygenation: Pre-oxygenation with 100% oxygen Ventilation: Mask ventilation without difficulty Laryngoscope Size: Mac and 4 Grade View: Grade I Tube type: Oral Tube size: 7.5 mm Number of attempts: 1 Airway Equipment and Method: Stylet Placement Confirmation: ETT inserted through vocal cords under direct vision,  positive ETCO2 and breath sounds checked- equal and bilateral Secured at: 22 cm Tube secured with: Tape Dental Injury: Teeth and Oropharynx as per pre-operative assessment  Comments: Easy atraumatic induction and intubation with MAC 4 blade.  Dr. Jacklynn BueMassagee verified placement of ETT.  Carlynn HeraldH Weaver, CRNA

## 2013-12-11 NOTE — Transfer of Care (Signed)
Immediate Anesthesia Transfer of Care Note  Patient: [name]  Procedure(s) Performed: Procedure(s): LAPAROSCOPIC CHOLECYSTECTOMY WITH INTRAOPERATIVE CHOLANGIOGRAM (N/A)  Patient Location: PACU  Anesthesia Type:General  Level of Consciousness: awake, alert  and oriented  Airway & Oxygen Therapy: Patient Spontanous Breathing  Post-op Assessment: Post -op Vital signs reviewed and stable  Post vital signs: stable  Complications: No apparent anesthesia complications

## 2013-12-11 NOTE — Anesthesia Postprocedure Evaluation (Signed)
  Anesthesia Post-op Note  Patient: [name]  Procedure(s) Performed: Procedure(s): LAPAROSCOPIC CHOLECYSTECTOMY WITH INTRAOPERATIVE CHOLANGIOGRAM (N/A)  Patient Location: PACU  Anesthesia Type:General  Level of Consciousness: awake and alert   Airway and Oxygen Therapy: Patient Spontanous Breathing  Post-op Pain: mild  Post-op Assessment: Post-op Vital signs reviewed  Post-op Vital Signs: stable  Last Vitals:  Filed Vitals:   12/11/13 1345  BP:   Pulse: 74  Temp:   Resp: 14    Complications: No apparent anesthesia complications

## 2013-12-11 NOTE — Anesthesia Preprocedure Evaluation (Addendum)
Anesthesia Evaluation  Patient identified by MRN, date of birth, ID band Patient awake    Reviewed: Allergy & Precautions, H&P , NPO status , Patient's Chart, lab work & pertinent test results, reviewed documented beta blocker date and time   History of Anesthesia Complications Negative for: history of anesthetic complications  Airway Mallampati: II TM Distance: >3 FB Neck ROM: Full  Mouth opening: Limited Mouth Opening  Dental  (+) Teeth Intact, Dental Advisory Given,    Pulmonary Current Smoker,  breath sounds clear to auscultation        Cardiovascular hypertension, Pt. on medications and Pt. on home beta blockers + angina with exertion Rhythm:Regular Rate:Normal  uses nitro monthly    Neuro/Psych Bipolar Disorder  Neuromuscular disease    GI/Hepatic hiatal hernia, GERD-  Medicated and Controlled,  Endo/Other  Hyperthyroidism   Renal/GU      Musculoskeletal   Abdominal (+)  Abdomen: soft. Bowel sounds: normal.  Peds  Hematology   Anesthesia Other Findings   Reproductive/Obstetrics                       Anesthesia Physical Anesthesia Plan  ASA: III  Anesthesia Plan: General   Post-op Pain Management:    Induction: Intravenous  Airway Management Planned: Oral ETT  Additional Equipment:   Intra-op Plan:   Post-operative Plan: Extubation in OR  Informed Consent: I have reviewed the patients History and Physical, chart, labs and discussed the procedure including the risks, benefits and alternatives for the proposed anesthesia with the patient or authorized representative who has indicated his/her understanding and acceptance.   Dental advisory given  Plan Discussed with: CRNA and Surgeon  Anesthesia Plan Comments:         Anesthesia Quick Evaluation

## 2013-12-11 NOTE — H&P (View-Only) (Signed)
Patient ID: [name], male   DOB: 28-Jun-1961, 53 y.o.   MRN: [id number]  Chief Complaint  Patient presents with  . eval gallbladder    HPI [name] is a 53 y.o. male.   HPI 53 year old Caucasian male referred by Dr. Pete GlatterStoneking for evaluation of abdominal pain. The patient states he's been having right upper quarter and abdominal pain for about 3-4 months. It initially was intermittent now the pain is almost on a daily basis. It is sharp in intensity. It does radiate to his back and shoulder. It's more of an ache in his back. He does also have daily nausea. He has occasional vomiting. He denies any fever or chills. He noticed that if he eats spicy foods the discomfort will be worsened. He denies any melena or hematochezia. He denies any acholic stools. He has bowel movements about every 3 days. He felt bloated after he eats. He does take chronic narcotics for musculoskeletal pain. Phenergan has helped with the nausea.  Past Medical History  Diagnosis Date  . Bipolar 1 disorder   . Grave's disease   . Hypertension   . Hiatal hernia   . Depression   . Hyperlipidemia     Past Surgical History  Procedure Laterality Date  . Cardiac catheterization    . Testicular torsion surgery    . Thyroidectomy    . Knee surgery      Family History  Problem Relation Age of Onset  . Hypertension Mother   . Hypertension Father   . Cancer Other   . Hypertension Other     Social History History  Substance Use Topics  . Smoking status: Current Every Day Smoker -- 0.50 packs/day    Types: Cigarettes  . Smokeless tobacco: Never Used  . Alcohol Use: No    No Known Allergies  Current Outpatient Prescriptions  Medication Sig Dispense Refill  . buPROPion (WELLBUTRIN SR) 150 MG 12 hr tablet Take 300 mg by mouth daily.       . citalopram (CELEXA) 20 MG tablet Take 20 mg by mouth daily.       . diazepam (VALIUM) 5 MG tablet Take 5 mg by mouth every 8 (eight) hours as needed. Anxiety       . doxazosin (CARDURA) 8 MG tablet Take 8 mg by mouth at bedtime.      . fenofibrate (TRICOR) 145 MG tablet Take 145 mg by mouth daily.      Marland Kitchen. gemfibrozil (LOPID) 600 MG tablet Take 600 mg by mouth 2 (two) times daily before a meal.      . hydrALAZINE (APRESOLINE) 50 MG tablet       . levothyroxine (SYNTHROID, LEVOTHROID) 200 MCG tablet Take 1 tablet (200 mcg total) by mouth daily. -Take medication on an empty stomach and at least 40 minutes away from any other medications.  30 tablet  1  . nitroGLYCERIN (NITROSTAT) 0.4 MG SL tablet Place 0.4 mg under the tongue every 5 (five) minutes as needed for chest pain.      Marland Kitchen. omega-3 acid ethyl esters (LOVAZA) 1 G capsule Take 4 g by mouth daily.      . OxyCODONE HCl ER 30 MG T12A Take by mouth.      . QUEtiapine (SEROQUEL XR) 300 MG 24 hr tablet Take 300 mg by mouth at bedtime.      . risperiDONE (RISPERDAL) 1 MG tablet Take 1 mg by mouth at bedtime.       Marland Kitchen. labetalol (  NORMODYNE) 200 MG tablet Take 1 tablet (200 mg total) by mouth 2 (two) times daily.  60 tablet  1  . pravastatin (PRAVACHOL) 40 MG tablet Take 1 tablet (40 mg total) by mouth daily.  30 tablet  1  . promethazine (PHENERGAN) 12.5 MG tablet Take 1 tablet (12.5 mg total) by mouth every 6 (six) hours as needed for nausea or vomiting.  30 tablet  0  . triamterene-hydrochlorothiazide (DYAZIDE) 37.5-25 MG per capsule Take 1 each (1 capsule total) by mouth every morning.  30 capsule  1   No current facility-administered medications for this visit.    Review of Systems Review of Systems  Constitutional: Negative for fever, chills, appetite change and unexpected weight change.  HENT: Negative for congestion and trouble swallowing.   Eyes: Negative for visual disturbance.  Respiratory: Negative for chest tightness and shortness of breath.   Cardiovascular: Negative for chest pain (reports some angina) and leg swelling.       No PND, no orthopnea, some DOE  Gastrointestinal: Positive for  nausea, vomiting, abdominal pain and constipation. Negative for diarrhea.       See HPI  Genitourinary: Negative for dysuria and hematuria.  Musculoskeletal: Positive for back pain and neck pain.       On chronic narcotics for MSKL pain  Skin: Negative for rash.  Neurological: Negative for seizures and speech difficulty.  Hematological: Does not bruise/bleed easily.  Psychiatric/Behavioral: Negative for behavioral problems and confusion.    Blood pressure 128/82, pulse 76, temperature 98.5 F (36.9 C), temperature source Oral, height 5\' 9"  (1.753 Connor), weight 184 lb (83.462 kg).  Physical Exam Physical Exam  Constitutional: He is oriented to person, place, and time. He appears well-developed and well-nourished. No distress.  HENT:  Head: Normocephalic and atraumatic.  Right Ear: External ear normal.  Left Ear: External ear normal.  Eyes: Conjunctivae are normal. No scleral icterus.  Neck: Normal range of motion. Neck supple. No tracheal deviation present. No thyromegaly present.  Cardiovascular: Normal rate, normal heart sounds and intact distal pulses.   Pulmonary/Chest: Effort normal and breath sounds normal. No respiratory distress. He has no wheezes.  Abdominal: Soft. He exhibits no distension. Tenderness: mild RUQ TTP. There is no rebound and no guarding.  Small fascial defect at umbilicus -<0.5cm  Musculoskeletal: Normal range of motion. He exhibits no edema and no tenderness.  Lymphadenopathy:    He has no cervical adenopathy.  Neurological: He is alert and oriented to person, place, and time. He exhibits normal muscle tone.  Skin: Skin is warm and dry. No rash noted. He is not diaphoretic. No erythema. No pallor.  Psychiatric: He has a normal mood and affect. His behavior is normal. Judgment and thought content normal.    Data Reviewed Dr Pete Glatter office note Abd [place] 2/15 - multiple gallstones, fatty liver 2013 Hospital discharge summary for malignant hypertension-had  negative cardiac workup. Essentially normal echocardiogram Assessment    Symptomatic cholelithiasis     Plan    I believe the patient's symptoms are consistent with gallbladder disease.  We discussed gallbladder disease. The patient was given Agricultural engineer. We discussed non-operative and operative management. We discussed the signs & symptoms of acute cholecystitis  I discussed laparoscopic cholecystectomy with IOC in detail.  The patient was given educational material as well as diagrams detailing the procedure.  We discussed the risks and benefits of a laparoscopic cholecystectomy including, but not limited to bleeding, infection, injury to surrounding structures such as the intestine  or liver, bile leak, retained gallstones, need to convert to an open procedure, prolonged diarrhea, blood clots such as  DVT, common bile duct injury, anesthesia risks, and possible need for additional procedures.  We discussed the typical post-operative recovery course. I explained that the likelihood of improvement of their symptoms is good.  I did give him a prescription for Phenergan.  He will meet with the scheduler today to schedule surgery  Mary Sella. Andrey Campanile, MD, FACS General, Bariatric, & Minimally Invasive Surgery [facility], [place]         [facility] Connor 12/04/2013, 10:36 AM

## 2013-12-11 NOTE — Interval H&P Note (Signed)
History and Physical Interval Note:  12/11/2013 9:45 AM  Rosealee AlbeeGrant M Ida  has presented today for surgery, with the diagnosis of cholelithiasis   The various methods of treatment have been discussed with the patient and family. After consideration of risks, benefits and other options for treatment, the patient has consented to  Procedure(s): LAPAROSCOPIC CHOLECYSTECTOMY WITH INTRAOPERATIVE CHOLANGIOGRAM (N/A) as a surgical intervention .  The patient's history has been reviewed, patient examined, no change in status, stable for surgery.  I have reviewed the patient's chart and labs.  Questions were answered to the patient's satisfaction.     pt states he took his labetaolol this am  Mary Sellaric M. Andrey CampanileWilson, MD, FACS General, Bariatric, & Minimally Invasive Surgery [facility] [place]   Atilano InaEric M Jadzia Ibsen

## 2013-12-11 NOTE — Op Note (Addendum)
MEADE HOGELAND [id number] 03/08/1961 12/11/2013  Laparoscopic Cholecystectomy with IOC Procedure Note  Indications: This patient presents with symptomatic gallbladder disease and will undergo laparoscopic cholecystectomy.  Pre-operative Diagnosis: Calculus of gallbladder without mention of cholecystitis or obstruction  Post-operative Diagnosis: Calculus of gallbladder with other cholecystitis and obstruction; possible choledocholithiasis  Surgeon: Atilano Ina   Assistants: none  Anesthesia: General endotracheal anesthesia  ASA Class: 3  Procedure Details  The patient was seen again in the Holding Room. The risks, benefits, complications, treatment options, and expected outcomes were discussed with the patient. The possibilities of reaction to medication, pulmonary aspiration, perforation of viscus, bleeding, recurrent infection, finding a normal gallbladder, the need for additional procedures, failure to diagnose a condition, the possible need to convert to an open procedure, and creating a complication requiring transfusion or operation were discussed with the patient. The likelihood of improving the patient's symptoms with return to their baseline status is good.  The patient and/or family concurred with the proposed plan, giving informed consent. The site of surgery properly noted. The patient was taken to Operating Room, identified as HUMBERTO ADDO and the procedure verified as Laparoscopic Cholecystectomy with Intraoperative Cholangiogram. A Time Out was held and the above information confirmed.  Prior to the induction of general anesthesia, antibiotic prophylaxis was administered. General endotracheal anesthesia was then administered and tolerated well. After the induction, the abdomen was prepped with Chloraprep and draped in the sterile fashion. The patient was positioned in the supine position.  Local anesthetic agent was injected into the skin near the umbilicus and an incision  made. We dissected down to the abdominal fascia with blunt dissection.  The fascia was incised vertically and we entered the peritoneal cavity bluntly.  A pursestring suture of 0-Vicryl was placed around the fascial opening.  The Hasson cannula was inserted and secured with the stay suture.  Pneumoperitoneum was then created with CO2 and tolerated well without any adverse changes in the patient's vital signs. An 5-mm port was placed in the subxiphoid position.  Two 5-mm ports were placed in the right upper quadrant. All skin incisions were infiltrated with a local anesthetic agent before making the incision and placing the trocars.   We positioned the patient in reverse Trendelenburg, tilted slightly to the patient's left.  The gallbladder was identified, the fundus grasped and retracted cephalad. Adhesions were lysed bluntly and with the electrocautery where indicated, taking care not to injure any adjacent organs or viscus. The infundibulum was grasped and retracted laterally, exposing the peritoneum overlying the triangle of Calot. This was then divided and exposed in a blunt fashion. A critical view of the cystic duct and cystic artery was obtained.  The cystic duct was clearly identified and bluntly dissected circumferentially. The cystic duct was ligated with a clip distally.   An incision was made in the cystic duct; however the cystic duct ended up transecting completely when I put it under tension. I was able to grasp the edge of the proximal duct with an alligator and thread the [facility] cholangiogram catheter into the duct. The catheter was secured using a clip. A cholangiogram was then obtained which showed good visualization of the distal and proximal biliary tree with what appeared to be a  sign of filling defect in the midportion of the CBD. Initially I had resistance when I flushed the catheter with contrast. The contrast did empty into the duodenum. On 2 additional runs, it still appeared to be a  partial filling defect  in the mid CBD but contrast did flow into the duodenum. The catheter was then removed.   The cystic duct was then ligated with  4 clips on the biliary aspect. The cystic artery had been  Identified and dissected free was ligated with clips and divided as well.   The gallbladder was dissected from the liver bed in retrograde fashion with the electrocautery. The gallbladder was removed and placed in an Endocatch sac.  The gallbladder and Endocatch sac were then removed through the umbilical port site. The liver bed was irrigated and inspected. Hemostasis was achieved with the electrocautery. Copious irrigation was utilized and was repeatedly aspirated until clear.  The pursestring suture was used to close the umbilical fascia.  An additional interrupted 0-vicryl suture was placed at the umbilical fascia.   We again inspected the right upper quadrant for hemostasis.  The umbilical closure was inspected and there was no air leak and nothing trapped within the closure. Pneumoperitoneum was released as we removed the trocars.  4-0 Monocryl was used to close the skin.   Dermabond was applied. The patient was then extubated and brought to the recovery room in stable condition. Instrument, sponge, and needle counts were correct at closure and at the conclusion of the case.   Findings: Cholelithiasis; filling defect in mid portion of CBD -probable air  Estimated Blood Loss: Minimal         Drains: none         Specimens: Gallbladder           Complications: None; patient tolerated the procedure well.         Disposition: PACU - hemodynamically stable.         Condition: stable  Mary SellaEric M. Andrey CampanileWilson, MD, FACS General, Bariatric, & Minimally Invasive Surgery [facility], [place]

## 2013-12-12 ENCOUNTER — Encounter (HOSPITAL_COMMUNITY): Payer: Self-pay | Admitting: General Practice

## 2013-12-12 LAB — COMPREHENSIVE METABOLIC PANEL
ALBUMIN: 3.4 g/dL — AB (ref 3.5–5.2)
ALK PHOS: 130 U/L — AB (ref 39–117)
ALT: 49 U/L (ref 0–53)
AST: 40 U/L — AB (ref 0–37)
BUN: 9 mg/dL (ref 6–23)
CHLORIDE: 103 meq/L (ref 96–112)
CO2: 25 mEq/L (ref 19–32)
Calcium: 8.9 mg/dL (ref 8.4–10.5)
Creatinine, Ser: 0.82 mg/dL (ref 0.50–1.35)
GFR calc Af Amer: >90 mL/min (ref >90)
GFR calc non Af Amer: >90 mL/min (ref >90)
Glucose, Bld: 104 mg/dL — ABNORMAL HIGH (ref 70–99)
Potassium: 3.3 mEq/L — ABNORMAL LOW (ref 3.7–5.3)
Sodium: 143 mEq/L (ref 137–147)
Total Bilirubin: 0.2 mg/dL — ABNORMAL LOW (ref 0.3–1.2)
Total Protein: 6.7 g/dL (ref 6.0–8.3)

## 2013-12-12 MED ORDER — OXYCODONE-ACETAMINOPHEN 5-325 MG PO TABS: 1.0000 | ORAL_TABLET | ORAL | Status: DC | PRN

## 2013-12-12 NOTE — Discharge Summary (Signed)
Physician Discharge Summary  FARAZ PONCIANO UJW:[id number] DOB: [birth year] DOA: 12/11/2013  PCP: Ginette Otto, MD  Admit date: 12/11/2013 Discharge date: 12/12/2013  Recommendations for Outpatient Follow-up:  1. Primary care physician within 1 month   Discharge Diagnoses:  Active Problems:   Bipolar 1 disorder   Graves' disease   HTN (hypertension)   Symptomatic cholelithiasis   S/P laparoscopic cholecystectomy  Surgical Procedure: Laparoscopic cholecystectomy with interoperative cholangiogram April 9  Discharge Condition: Good Disposition: Home  Diet recommendation: Low-fat  Filed Weights   12/11/13 0848 12/11/13 1443  Weight: 188 lb (85.276 kg) 188 lb 6.6 oz (85.462 kg)   Hospital Course:  53 year old Caucasian male was brought to the hospital for planned laparoscopic cholecystectomy with cholangiogram for symptomatic cholelithiasis. During surgery on his cholangiogram there was concerns for a possible filling defect in the common bile duct. However contrast emptied into the duodenum. Radiology read the area of abnormality as a probable air bubble. He was kept overnight for observation. On postoperative day one he was tolerating a diet. He was ambulating without difficulty. His pain was controlled with oral pain medication. His vital signs are stable. He had a small bump and some of his LFTs which was expected due to surgery. We discussed discharge instructions.   I will plan to repeat his LFTs prior to his followup appointment in several weeks to make sure they have normalized  BP 167/96  Pulse 88  Temp(Src) 98.2 F (36.8 C) (Oral)  Resp 20  Ht 5\' 9"  (1.753 m)  Wt 188 lb 6.6 oz (85.462 kg)  BMI 27.81 kg/m2  SpO2 100%  Gen: alert, NAD, non-toxic appearing Pupils: equal, no scleral icterus Pulm: Lungs clear to auscultation, symmetric chest rise CV: regular rate and rhythm Abd: soft,  Mild tender around incisions, nondistended.. No cellulitis. No incisional  hernia Ext: no edema,  Skin: no rash, no jaundice    Discharge Instructions      Discharge Orders   Future Appointments Provider Department Dept Phone   12/25/2013 2:15 PM Atilano Ina, MD [facility], [place] [phone]   Future Orders Complete By Expires   Diet - low sodium heart healthy  As directed    Discharge instructions  As directed    Increase activity slowly  As directed        Medication List         buPROPion 150 MG 12 hr tablet  Commonly known as:  WELLBUTRIN SR  Take 300 mg by mouth daily.     diazepam 5 MG tablet  Commonly known as:  VALIUM  Take 5 mg by mouth every 8 (eight) hours as needed for anxiety.     fenofibrate 145 MG tablet  Commonly known as:  TRICOR  Take 145 mg by mouth daily.     gemfibrozil 600 MG tablet  Commonly known as:  LOPID  Take 600 mg by mouth 2 (two) times daily before a meal.     hydrALAZINE 50 MG tablet  Commonly known as:  APRESOLINE  Take 100 mg by mouth 2 (two) times daily.     labetalol 200 MG tablet  Commonly known as:  NORMODYNE  Take 1 tablet (200 mg total) by mouth 2 (two) times daily.     levothyroxine 200 MCG tablet  Commonly known as:  SYNTHROID, LEVOTHROID  Take 1 tablet (200 mcg total) by mouth daily. -Take medication on an empty stomach and at least 40 minutes away from any other medications.  nitroGLYCERIN 0.4 MG SL tablet  Commonly known as:  NITROSTAT  Place 0.4 mg under the tongue every 5 (five) minutes as needed for chest pain.     omega-3 acid ethyl esters 1 G capsule  Commonly known as:  LOVAZA  Take 4 g by mouth daily.     OxyCODONE HCl ER 30 MG T12a  Take 1 tablet by mouth every 8 (eight) hours as needed (pain).     oxyCODONE-acetaminophen 5-325 MG per tablet  Commonly known as:  PERCOCET/ROXICET  Take 1-2 tablets by mouth every 4 (four) hours as needed for moderate pain or severe pain.     promethazine 12.5 MG tablet  Commonly known as:  PHENERGAN  Take 1 tablet (12.5 mg  total) by mouth every 6 (six) hours as needed for nausea or vomiting.     QUEtiapine 300 MG tablet  Commonly known as:  SEROQUEL  Take 300 mg by mouth at bedtime.          The results of significant diagnostics from this hospitalization (including imaging, microbiology, ancillary and laboratory) are listed below for reference.    Significant Diagnostic Studies: Dg Cholangiogram Operative  12/11/2013   CLINICAL DATA:  Cholelithiasis  EXAM: INTRAOPERATIVE CHOLANGIOGRAM  TECHNIQUE: Cholangiographic images from the C-arm fluoroscopic device were submitted for interpretation post-operatively. Please see the procedural report for the amount of contrast and the fluoroscopy time utilized.  COMPARISON:  Ultrasound 10/09/2013  FINDINGS: Twelve views intraoperative cholangiogram submitted. There is contrast opacification of CBD and intrahepatic ducts and contrast is noted progressing into duodenum. No CBD filling defects. No extraluminal contrast extravasation. There is a linear somewhat elliptical contrast density and also some round contrast density to the left of CBD. This most likely represent contrast escaped during injection/instrumentation. Clinical correlation is necessary.  IMPRESSION: There is contrast opacification of CBD and intrahepatic ducts and contrast is noted progressing into duodenum. No CBD filling defects. No extraluminal contrast extravasation. There is a linear somewhat elliptical contrast density and also some round contrast density to the left of CBD. This most likely represent contrast escaped during injection/instrumentation. Clinical correlation is necessary.  I discussed with Dr. Andrey Campanile.   Electronically Signed   By: Natasha Mead M.D.   On: 12/11/2013 11:38    Microbiology: No results found for this or any previous visit (from the past 240 hour(s)).   Labs: Basic Metabolic Panel:  Recent Labs Lab 12/09/13 1439 12/12/13 0438  NA 143 143  K 3.2* 3.3*  CL 101 103  CO2 25 25   GLUCOSE 82 104*  BUN 13 9  CREATININE 0.77 0.82  CALCIUM 9.6 8.9   Liver Function Tests:  Recent Labs Lab 12/12/13 0438  AST 40*  ALT 49  ALKPHOS 130*  BILITOT 0.2*  PROT 6.7  ALBUMIN 3.4*   No results found for this basename: LIPASE, AMYLASE,  in the last 168 hours No results found for this basename: AMMONIA,  in the last 168 hours CBC:  Recent Labs Lab 12/09/13 1439  WBC 10.6*  HGB 13.6  HCT 38.6*  MCV 87.9  PLT 472*   Cardiac Enzymes: No results found for this basename: CKTOTAL, CKMB, CKMBINDEX, TROPONINI,  in the last 168 hours BNP: BNP (last 3 results) No results found for this basename: PROBNP,  in the last 8760 hours CBG: No results found for this basename: GLUCAP,  in the last 168 hours  Active Problems:   Bipolar 1 disorder   Graves' disease   HTN (hypertension)  Symptomatic cholelithiasis   S/P laparoscopic cholecystectomy   Time coordinating discharge: 15 minutes  Signed:  Atilano InaEric M Brieonna Crutcher, MD [facility], [place] [phone] 12/12/2013, 9:06 AM

## 2013-12-12 NOTE — Discharge Instructions (Signed)
[facility] °LAPAROSCOPIC SURGERY: POST OP INSTRUCTIONS °Always review your discharge instruction sheet given to you by the facility where your surgery was performed. °IF YOU HAVE DISABILITY OR FAMILY LEAVE FORMS, YOU MUST BRING THEM TO THE OFFICE FOR PROCESSING.   °DO NOT GIVE THEM TO YOUR DOCTOR. ° °1. A prescription for pain medication may be given to you upon discharge.  Take your pain medication as prescribed, if needed.  If narcotic pain medicine is not needed, then you may take acetaminophen (Tylenol) or ibuprofen (Advil) as needed. °2. Take your usually prescribed medications unless otherwise directed. °3. If you need a refill on your pain medication, please contact your pharmacy.  They will contact our office to request authorization. Prescriptions will not be filled after 5pm or on week-ends. °4. You should follow a light diet the first few days after arrival home, such as soup and crackers, etc.  Be sure to include lots of fluids daily. °5. Most patients will experience some swelling and bruising in the area of the incisions.  Ice packs will help.  Swelling and bruising can take several days to resolve.  °6. It is common to experience some constipation if taking pain medication after surgery.  Increasing fluid intake and taking a stool softener (such as Colace) will usually help or prevent this problem from occurring.  A mild laxative (Milk of Magnesia or Miralax) should be taken according to package instructions if there are no bowel movements after 48 hours. °7. If your surgeon used skin glue on the incision, you may shower in 24 hours.  The glue will flake off over the next 2-3 weeks.  Any sutures or staples will be removed at the office during your follow-up visit. °8. ACTIVITIES:  You may resume regular (light) daily activities beginning the next day--such as daily self-care, walking, climbing stairs--gradually increasing activities as tolerated.  You may have sexual  intercourse when it is comfortable.  Refrain from any heavy lifting or straining until approved by your doctor. °a. You may drive when you are no longer taking prescription pain medication, you can comfortably wear a seatbelt, and you can safely maneuver your car and apply brakes. °9. You should see your doctor in the office for a follow-up appointment approximately 2-3 weeks after your surgery.  Make sure that you call for this appointment within a day or two after you arrive home to insure a convenient appointment time. °10. OTHER INSTRUCTIONS:  °WHEN TO CALL YOUR DOCTOR: °1. Fever over 101.0 °2. Inability to urinate °3. Continued bleeding from incision. °4. Increased pain, redness, or drainage from the incision. °5. Increasing abdominal pain ° °The clinic staff is available to answer your questions during regular business hours.  Please don’t hesitate to call and ask to speak to one of the nurses for clinical concerns.  If you have a medical emergency, go to the nearest emergency room or call 911.  A surgeon from [facility] is always on call at the hospital. °[address], [address], [place], [place]  [address] ? [address] [address], [place], [place]   [address] °[phone] ? [phone] ? FAX [phone] °Web site: [email or web address] ° °

## 2013-12-12 NOTE — Progress Notes (Signed)
Discharge instructions,  Rx, and care of the surgical sites reviewed with the patient. Smoking cessation information given and reviewed with pt. Pt reports that he is feeling better, pain is managed, and that he is ready for discharge. Reviewed with pt when to call the MD and importance of follow up appointment. Pt doing well with incentive spirometry and reaching 1750.

## 2013-12-15 ENCOUNTER — Encounter (HOSPITAL_COMMUNITY): Payer: Self-pay | Admitting: General Surgery

## 2013-12-25 ENCOUNTER — Encounter (INDEPENDENT_AMBULATORY_CARE_PROVIDER_SITE_OTHER): Payer: Self-pay | Admitting: General Surgery

## 2013-12-25 ENCOUNTER — Other Ambulatory Visit (INDEPENDENT_AMBULATORY_CARE_PROVIDER_SITE_OTHER): Payer: Self-pay | Admitting: General Surgery

## 2013-12-25 ENCOUNTER — Ambulatory Visit (INDEPENDENT_AMBULATORY_CARE_PROVIDER_SITE_OTHER): Payer: Medicare Other | Admitting: General Surgery

## 2013-12-25 VITALS — BP 134/78 | HR 72 | Temp 97.5°F | Ht 68.0 in | Wt 184.6 lb

## 2013-12-25 DIAGNOSIS — Z9049 Acquired absence of other specified parts of digestive tract: Secondary | ICD-10-CM

## 2013-12-25 DIAGNOSIS — Z9889 Other specified postprocedural states: Secondary | ICD-10-CM

## 2013-12-25 DIAGNOSIS — R1011 Right upper quadrant pain: Secondary | ICD-10-CM

## 2013-12-25 NOTE — Progress Notes (Signed)
Subjective:     Patient ID: [name], male   DOB: 1961/08/23, 53 y.o.   MRN: [id number]  HPI This 53 year old male comes in for followup after undergoing laparoscopic cholecystectomy with cholangiogram on April 9. He was kept overnight for observation. I initially thought there may be a common bile duct stone however radiology read his cholangiogram as air within the duct. He denies any fevers or chills. He denies any nausea vomiting. He reports normal bowel movements. He states his appetite is back to normal now. He still does have a small dull ache on his right side mainly with inspiration. He states it is getting  Review of Systems     Objective:   Physical Exam BP 134/78  Pulse 72  Temp(Src) 97.5 F (36.4 C)  Ht 5\' 8"  (1.727 m)  Wt 184 lb 9.6 oz (83.734 kg)  BMI 28.07 kg/m2  Gen: alert, NAD, non-toxic appearing Pupils: equal, no scleral icterus Pulm: Lungs clear to auscultation, symmetric chest rise CV: regular rate and rhythm Abd: soft, nontender, nondistended. Well-healed trocar sites. No cellulitis. No incisional hernia Ext: no edema, Skin: no rash, no jaundice     Assessment:     Status post laparoscopic cholecystectomy with cholangiogram     Plan:     Overall I think he is doing well. We reviewed his pathology showed chronic cholecystitis and cholelithiasis. He was given a copy of his path report. Because there is a question about his cholangiogram I do want to get repeat LFTs on him. He states that he is due to get blood work drawn at his regular doctor's office shortly and will just ask him to get his labs from there at that time. He was instructed to have his PCP fax me the results. He will followup as needed; however, if the labs are abnormal we will of course followup with him  Mary Sellaric M. Andrey CampanileWilson, MD, FACS General, Bariatric, & Minimally Invasive Surgery [facility], [place]

## 2013-12-25 NOTE — Patient Instructions (Signed)
Get your labs drawn please

## 2014-05-27 ENCOUNTER — Other Ambulatory Visit: Payer: Self-pay | Admitting: Gastroenterology

## 2015-01-12 ENCOUNTER — Encounter (HOSPITAL_COMMUNITY): Payer: Self-pay | Admitting: Cardiology

## 2015-01-12 ENCOUNTER — Emergency Department (HOSPITAL_COMMUNITY)
Admission: EM | Admit: 2015-01-12 | Discharge: 2015-01-12 | Disposition: A | Discharge status: Home / Self Care | Payer: Medicare Other | Attending: Emergency Medicine | Admitting: Emergency Medicine

## 2015-01-12 DIAGNOSIS — I201 Angina pectoris with documented spasm: Secondary | ICD-10-CM | POA: Diagnosis not present

## 2015-01-12 DIAGNOSIS — I10 Essential (primary) hypertension: Secondary | ICD-10-CM | POA: Insufficient documentation

## 2015-01-12 DIAGNOSIS — Z8739 Personal history of other diseases of the musculoskeletal system and connective tissue: Secondary | ICD-10-CM | POA: Diagnosis not present

## 2015-01-12 DIAGNOSIS — Z79899 Other long term (current) drug therapy: Secondary | ICD-10-CM | POA: Insufficient documentation

## 2015-01-12 DIAGNOSIS — T63481A Toxic effect of venom of other arthropod, accidental (unintentional), initial encounter: Secondary | ICD-10-CM | POA: Diagnosis not present

## 2015-01-12 DIAGNOSIS — Y929 Unspecified place or not applicable: Secondary | ICD-10-CM | POA: Diagnosis not present

## 2015-01-12 DIAGNOSIS — F319 Bipolar disorder, unspecified: Secondary | ICD-10-CM | POA: Diagnosis not present

## 2015-01-12 DIAGNOSIS — X58XXXA Exposure to other specified factors, initial encounter: Secondary | ICD-10-CM | POA: Insufficient documentation

## 2015-01-12 DIAGNOSIS — Z8639 Personal history of other endocrine, nutritional and metabolic disease: Secondary | ICD-10-CM | POA: Diagnosis not present

## 2015-01-12 DIAGNOSIS — T63441A Toxic effect of venom of bees, accidental (unintentional), initial encounter: Secondary | ICD-10-CM

## 2015-01-12 DIAGNOSIS — Z8719 Personal history of other diseases of the digestive system: Secondary | ICD-10-CM | POA: Insufficient documentation

## 2015-01-12 DIAGNOSIS — Y939 Activity, unspecified: Secondary | ICD-10-CM | POA: Diagnosis not present

## 2015-01-12 DIAGNOSIS — L500 Allergic urticaria: Secondary | ICD-10-CM | POA: Diagnosis not present

## 2015-01-12 DIAGNOSIS — Z720 Tobacco use: Secondary | ICD-10-CM | POA: Diagnosis not present

## 2015-01-12 DIAGNOSIS — Z87438 Personal history of other diseases of male genital organs: Secondary | ICD-10-CM | POA: Insufficient documentation

## 2015-01-12 DIAGNOSIS — T7840XA Allergy, unspecified, initial encounter: Secondary | ICD-10-CM

## 2015-01-12 DIAGNOSIS — Y999 Unspecified external cause status: Secondary | ICD-10-CM | POA: Diagnosis not present

## 2015-01-12 MED ORDER — EPINEPHRINE 0.3 MG/0.3ML IJ SOAJ: 0.3000 mg | Freq: Once | INTRAMUSCULAR | Status: DC

## 2015-01-12 MED ORDER — DIPHENHYDRAMINE HCL 50 MG/ML IJ SOLN
25.0000 mg | Freq: Once | INTRAMUSCULAR | Status: AC
  Administered 2015-01-12: 25 mg via INTRAVENOUS
  Filled 2015-01-12: qty 1

## 2015-01-12 MED ORDER — PREDNISONE 10 MG PO TABS: 20.0000 mg | ORAL_TABLET | Freq: Two times a day (BID) | ORAL | Status: DC

## 2015-01-12 MED ORDER — EPINEPHRINE HCL 1 MG/ML IJ SOLN
0.3000 mg | Freq: Once | INTRAMUSCULAR | Status: AC
  Administered 2015-01-12: 0.3 mg via SUBCUTANEOUS
  Filled 2015-01-12: qty 1

## 2015-01-12 MED ORDER — METHYLPREDNISOLONE SODIUM SUCC 125 MG IJ SOLR
125.0000 mg | Freq: Once | INTRAMUSCULAR | Status: AC
  Administered 2015-01-12: 125 mg via INTRAVENOUS
  Filled 2015-01-12: qty 2

## 2015-01-12 NOTE — ED Notes (Signed)
Pt reports that he was stung by a bee on the right elbow and used his epi pen about 30 minutes ago. Reports the pen is outdated. No SOB, airway intact.

## 2015-01-12 NOTE — ED Provider Notes (Signed)
CSN: [id number]     Arrival date & time 01/12/15  1203 History   First MD Initiated Contact with Patient 01/12/15 1410     Chief Complaint  Patient presents with  . Insect Bite     (Consider location/radiation/quality/duration/timing/severity/associated sxs/prior Treatment) HPI Comments: Patient is a 54 year old male with history of allergies to bee stings. He was working in the yard today when he was stung on the right elbow. He began with swelling to the area right away and also developed swelling to the face and neck. He felt itchy all over. He gave himself an injection of the EpiPen that he had at home, however this had expired in 2009. It did seem to help somewhat.  Patient is a 54 y.o. male presenting with allergic reaction. The history is provided by the patient.  Allergic Reaction Presenting symptoms: rash and swelling   Severity:  Moderate Prior allergic episodes:  Insect allergies Context: insect bite/sting   Relieved by: Outdated EpiPen. Worsened by:  Nothing tried   Past Medical History  Diagnosis Date  . Bipolar 1 disorder   . Grave's disease   . Hypertension   . Hiatal hernia   . Depression   . Hyperlipidemia   . H/O prostatitis   . Arthritis     neck and knee  . Cervical disc syndrome   . Lumbar degenerative disc disease   . GERD (gastroesophageal reflux disease)   . Anginal pain 2000    takes NTG prn   Past Surgical History  Procedure Laterality Date  . Testicular torsion surgery    . Thyroidectomy    . Knee surgery    . Cardiac catheterization  2000  . Cholecystectomy  12/11/2013    DR Andrey CampanileWILSON   . Cholecystectomy N/A 12/11/2013    Procedure: LAPAROSCOPIC CHOLECYSTECTOMY WITH INTRAOPERATIVE CHOLANGIOGRAM;  Surgeon: Atilano InaEric M Wilson, MD;  [facility];  Service: General;  Laterality: N/A;   Family History  Problem Relation Age of Onset  . Hypertension Mother   . Hypertension Father   . Cancer Other   . Hypertension Other    History  Substance Use  Topics  . Smoking status: Current Every Day Smoker -- 0.25 packs/day for 35 years    Types: Cigarettes  . Smokeless tobacco: Never Used  . Alcohol Use: No    Review of Systems  Skin: Positive for rash.  All other systems reviewed and are negative.     Allergies  Bee venom  Home Medications   Prior to Admission medications   Medication Sig Start Date End Date Taking? Authorizing Provider  buPROPion (WELLBUTRIN SR) 150 MG 12 hr tablet Take 300 mg by mouth daily.     Historical Provider, MD  diazepam (VALIUM) 5 MG tablet Take 5 mg by mouth every 8 (eight) hours as needed for anxiety.     Historical Provider, MD  doxazosin (CARDURA) 1 MG tablet Take 1 mg by mouth daily.    Historical Provider, MD  fenofibrate (TRICOR) 145 MG tablet Take 145 mg by mouth daily.    Historical Provider, MD  gemfibrozil (LOPID) 600 MG tablet Take 600 mg by mouth 2 (two) times daily before a meal.    Historical Provider, MD  hydrALAZINE (APRESOLINE) 50 MG tablet Take 100 mg by mouth 2 (two) times daily.  11/26/13   Historical Provider, MD  labetalol (NORMODYNE) 200 MG tablet Take 1 tablet (200 mg total) by mouth 2 (two) times daily. 10/06/11 12/05/13  Vassie Lollarlos Madera, MD  levothyroxine (SYNTHROID, LEVOTHROID) 200 MCG tablet Take 1 tablet (200 mcg total) by mouth daily. -Take medication on an empty stomach and at least 40 minutes away from any other medications. 10/06/11   Vassie Lollarlos Madera, MD  nitroGLYCERIN (NITROSTAT) 0.4 MG SL tablet Place 0.4 mg under the tongue every 5 (five) minutes as needed for chest pain.    Historical Provider, MD  omega-3 acid ethyl esters (LOVAZA) 1 G capsule Take 4 g by mouth daily.    Historical Provider, MD  OxyCODONE HCl ER 30 MG T12A Take 1 tablet by mouth every 8 (eight) hours as needed (pain).     Historical Provider, MD  promethazine (PHENERGAN) 12.5 MG tablet Take 1 tablet (12.5 mg total) by mouth every 6 (six) hours as needed for nausea or vomiting. 12/04/13   Gaynelle AduEric Wilson, MD   QUEtiapine (SEROQUEL) 300 MG tablet Take 300 mg by mouth at bedtime.    Historical Provider, MD   BP 161/99 mmHg  Pulse 84  Temp(Src) 98 F (36.7 C) (Oral)  Resp 18  Ht 5\' 9"  (1.753 m)  Wt 175 lb (79.379 kg)  BMI 25.83 kg/m2  SpO2 99% Physical Exam  Constitutional: He is oriented to person, place, and time. He appears well-developed and well-nourished. No distress.  HENT:  Head: Normocephalic and atraumatic.  Mouth/Throat: Oropharynx is clear and moist.  There is swelling to the right face.  Neck: Normal range of motion. Neck supple.  Cardiovascular: Normal rate, regular rhythm and normal heart sounds.   No murmur heard. Pulmonary/Chest: Effort normal and breath sounds normal. No stridor. No respiratory distress. He has no wheezes.  Abdominal: Soft. Bowel sounds are normal. He exhibits no distension. There is no tenderness.  Musculoskeletal: Normal range of motion. He exhibits no edema.  Lymphadenopathy:    He has no cervical adenopathy.  Neurological: He is alert and oriented to person, place, and time.  Skin: He is not diaphoretic.  There is an urticarial rash to the right elbow near the site of the sting.  Nursing note and vitals reviewed.   ED Course  Procedures (including critical care time) Labs Review Labs Reviewed - No data to display  Imaging Review No results found.   EKG Interpretation None      MDM   Final diagnoses:  None    Patient presents here after being stung by a bee. He was working in the line when he was stung on the right elbow. Since that time he has been itching all over and has swelling in his face. Gave himself an injection of an expired EpiPen prior to arrival. He was given additional epi, IV Benadryl, and IV Solu-Medrol in the ER and was observed. He is containing his airway and appears quite stable. He will be discharged to home with prednisone, Benadryl, and prescription for an EpiPen.    Geoffery Lyonsouglas Kambra Beachem, MD 01/12/15 [phone]

## 2015-01-12 NOTE — Discharge Instructions (Signed)
Prednisone 20 mg twice daily for next 3 days.  Benadryl 25 mg every 6 hours for the next 3 days.  Return to the emergency department if your symptoms significantly worsen or change.   Anaphylactic Reaction An anaphylactic reaction is a sudden, severe allergic reaction that involves the whole body. It can be life threatening. A hospital stay is often required. People with asthma, eczema, or hay fever are slightly more likely to have an anaphylactic reaction. CAUSES  An anaphylactic reaction may be caused by anything to which you are allergic. After being exposed to the allergic substance, your immune system becomes sensitized to it. When you are exposed to that allergic substance again, an allergic reaction can occur. Common causes of an anaphylactic reaction include:  Medicines.  Foods, especially peanuts, wheat, shellfish, milk, and eggs.  Insect bites or stings.  Blood products.  Chemicals, such as dyes, latex, and contrast material used for imaging tests. SYMPTOMS  When an allergic reaction occurs, the body releases histamine and other substances. These substances cause symptoms such as tightening of the airway. Symptoms often develop within seconds or minutes of exposure. Symptoms may include:  Skin rash or hives.  Itching.  Chest tightness.  Swelling of the eyes, tongue, or lips.  Trouble breathing or swallowing.  Lightheadedness or fainting.  Anxiety or confusion.  Stomach pains, vomiting, or diarrhea.  Nasal congestion.  A fast or irregular heartbeat (palpitations). DIAGNOSIS  Diagnosis is based on your history of recent exposure to allergic substances, your symptoms, and a physical exam. Your caregiver may also perform blood or urine tests to confirm the diagnosis. TREATMENT  Epinephrine medicine is the main treatment for an anaphylactic reaction. Other medicines that may be used for treatment include antihistamines, steroids, and albuterol. In severe cases,  fluids and medicine to support blood pressure may be given through an intravenous line (IV). Even if you improve after treatment, you need to be observed to make sure your condition does not get worse. This may require a stay in the hospital. [place] a medical alert bracelet or necklace stating your allergy.  You and your family must learn how to use an anaphylaxis kit or give an epinephrine injection to temporarily treat an emergency allergic reaction. Always carry your epinephrine injection or anaphylaxis kit with you. This can be lifesaving if you have a severe reaction.  Do not drive or perform tasks after treatment until the medicines used to treat your reaction have worn off, or until your caregiver says it is okay.  If you have hives or a rash:  Take medicines as directed by your caregiver.  You may use an over-the-counter antihistamine (diphenhydramine) as needed.  Apply cold compresses to the skin or take baths in cool water. Avoid hot baths or showers. SEEK MEDICAL CARE IF:   You develop symptoms of an allergic reaction to a new substance. Symptoms may start right away or minutes later.  You develop a rash, hives, or itching.  You develop new symptoms. SEEK IMMEDIATE MEDICAL CARE IF:   You have swelling of the mouth, difficulty breathing, or wheezing.  You have a tight feeling in your chest or throat.  You develop hives, swelling, or itching all over your body.  You develop severe vomiting or diarrhea.  You feel faint or pass out. This is an emergency. Use your epinephrine injection or anaphylaxis kit as you have been instructed. Call your local emergency services (911 in [place] Even if you improve  after the injection, you need to be examined at a hospital emergency department. MAKE SURE YOU:   Understand these instructions.  Will watch your condition.  Will get help right away if you are not doing well or get worse. Document Released:  08/21/2005 Document Revised: 08/26/2013 Document Reviewed: 11/22/2011 [facility] Patient Information 2015 [place], [place]. This information is not intended to replace advice given to you by your health care provider. Make sure you discuss any questions you have with your health care provider.

## 2015-02-09 ENCOUNTER — Other Ambulatory Visit: Payer: Self-pay | Admitting: Geriatric Medicine

## 2015-02-09 DIAGNOSIS — F17211 Nicotine dependence, cigarettes, in remission: Secondary | ICD-10-CM

## 2016-01-22 ENCOUNTER — Encounter (HOSPITAL_COMMUNITY): Payer: Self-pay

## 2016-01-22 ENCOUNTER — Emergency Department (HOSPITAL_COMMUNITY): Payer: Medicare Other

## 2016-01-22 ENCOUNTER — Emergency Department (HOSPITAL_COMMUNITY)
Admission: EM | Admit: 2016-01-22 | Discharge: 2016-01-22 | Disposition: A | Discharge status: Home / Self Care | Payer: Medicare Other | Attending: Emergency Medicine | Admitting: Emergency Medicine

## 2016-01-22 DIAGNOSIS — Z8719 Personal history of other diseases of the digestive system: Secondary | ICD-10-CM | POA: Diagnosis not present

## 2016-01-22 DIAGNOSIS — E785 Hyperlipidemia, unspecified: Secondary | ICD-10-CM | POA: Insufficient documentation

## 2016-01-22 DIAGNOSIS — F319 Bipolar disorder, unspecified: Secondary | ICD-10-CM | POA: Insufficient documentation

## 2016-01-22 DIAGNOSIS — R61 Generalized hyperhidrosis: Secondary | ICD-10-CM | POA: Diagnosis not present

## 2016-01-22 DIAGNOSIS — F17210 Nicotine dependence, cigarettes, uncomplicated: Secondary | ICD-10-CM | POA: Insufficient documentation

## 2016-01-22 DIAGNOSIS — Z79899 Other long term (current) drug therapy: Secondary | ICD-10-CM | POA: Diagnosis not present

## 2016-01-22 DIAGNOSIS — I10 Essential (primary) hypertension: Secondary | ICD-10-CM | POA: Diagnosis not present

## 2016-01-22 DIAGNOSIS — R079 Chest pain, unspecified: Secondary | ICD-10-CM | POA: Insufficient documentation

## 2016-01-22 DIAGNOSIS — Z8739 Personal history of other diseases of the musculoskeletal system and connective tissue: Secondary | ICD-10-CM | POA: Insufficient documentation

## 2016-01-22 DIAGNOSIS — R112 Nausea with vomiting, unspecified: Secondary | ICD-10-CM | POA: Diagnosis not present

## 2016-01-22 LAB — CBC
HCT: 41.2 % (ref 39.0–52.0)
HEMOGLOBIN: 13.8 g/dL (ref 13.0–17.0)
MCH: 28.9 pg (ref 26.0–34.0)
MCHC: 33.5 g/dL (ref 30.0–36.0)
MCV: 86.4 fL (ref 78.0–100.0)
PLATELETS: 318 10*3/uL (ref 150–400)
RBC: 4.77 MIL/uL (ref 4.22–5.81)
RDW: 12.6 % (ref 11.5–15.5)
WBC: 10.5 10*3/uL (ref 4.0–10.5)

## 2016-01-22 LAB — BASIC METABOLIC PANEL
Anion gap: 11 (ref 5–15)
BUN: 16 mg/dL (ref 6–20)
CALCIUM: 9.2 mg/dL (ref 8.9–10.3)
CO2: 26 mmol/L (ref 22–32)
CREATININE: 1.02 mg/dL (ref 0.61–1.24)
Chloride: 104 mmol/L (ref 101–111)
GFR calc Af Amer: >60 mL/min (ref >60)
Glucose, Bld: 130 mg/dL — ABNORMAL HIGH (ref 65–99)
Potassium: 4.3 mmol/L (ref 3.5–5.1)
SODIUM: 141 mmol/L (ref 135–145)

## 2016-01-22 LAB — I-STAT TROPONIN, ED
TROPONIN I, POC: 0 ng/mL (ref 0.00–0.08)
Troponin i, poc: 0 ng/mL (ref 0.00–0.08)

## 2016-01-22 MED ORDER — ASPIRIN 81 MG PO CHEW
324.0000 mg | CHEWABLE_TABLET | Freq: Once | ORAL | Status: AC
  Administered 2016-01-22: 324 mg via ORAL
  Filled 2016-01-22: qty 4

## 2016-01-22 MED ORDER — LABETALOL HCL 200 MG PO TABS
300.0000 mg | ORAL_TABLET | Freq: Once | ORAL | Status: AC
  Administered 2016-01-22: 300 mg via ORAL
  Filled 2016-01-22: qty 2

## 2016-01-22 MED ORDER — DOXAZOSIN MESYLATE 4 MG PO TABS
4.0000 mg | ORAL_TABLET | Freq: Once | ORAL | Status: AC
  Administered 2016-01-22: 4 mg via ORAL
  Filled 2016-01-22: qty 1

## 2016-01-22 NOTE — Discharge Instructions (Signed)
Your workup in the emergency department today is reassuring the ER not having an acute cardiac event. However he should follow-up with your regular doctor for reassessment and possible referral to cardiology for further workup and testing as an outpatient.   Return to the ED immediately if you experience worsening chest pain or chest pressure, passing out, or other new or worsening symptoms.

## 2016-01-22 NOTE — ED Notes (Signed)
Patient reports that he was out mowing yeard on riding mower and developed SSCP with radiation to left arm. Took 1 SL ntg with relief but complains of ongoing left arm discomfort

## 2016-01-22 NOTE — ED Provider Notes (Signed)
CSN: [id number]     Arrival date & time 01/22/16  1533 History   First MD Initiated Contact with Patient 01/22/16 1545     Chief Complaint  Patient presents with  . Chest Pain     (Consider location/radiation/quality/duration/timing/severity/associated sxs/prior Treatment) HPI Patient is a 55 year old male with past medical history of hypertension, hyperlipidemia, Graves' disease who presents with chest pain. Chest pain onset was about 2 hours prior to arrival while patient was mowing his lawn. Patient reports he became sweaty, nauseated and short of breath and began experiencing left-sided chest discomfort. He describes it as aching and heaviness that radiated to his left arm. No radiation to the back or jaw. Patient took nitroglycerin 4 with relief of his chest discomfort; however he continues to have heaviness in his left arm. He was concerned that his nitroglycerin was expired. Patient reports a self limited illness with nausea and vomiting 2 episodes yesterday. Denies recent fever, chills, cough or other symptoms. Patient denies prior history of CAD or heart attack. He has had a cardiac catheterization in 2000, when he was report he was told he had coronary vasospasm. He was started on nitroglycerin for anginal pain at that time. Reports 4-5 episodes of chest discomfort a year that are always relieved with nitroglycerin. His episode today was different as his discomfort did not completely resolve. Cardiac risk factors include hypertension and hyperlipidemia. He denies personal personal history of strokes, peripheral arterial disease, DVT/PE, or other bleeding or clotting problems. Denies cocaine or other drug use. Denies family history of MI.  Past Medical History  Diagnosis Date  . Bipolar 1 disorder (HCC)   . Grave's disease   . Hypertension   . Hiatal hernia   . Depression   . Hyperlipidemia   . H/O prostatitis   . Arthritis     neck and knee  . Cervical disc syndrome   . Lumbar  degenerative disc disease   . GERD (gastroesophageal reflux disease)   . Anginal pain (HCC) 2000    takes NTG prn   Past Surgical History  Procedure Laterality Date  . Testicular torsion surgery    . Thyroidectomy    . Knee surgery    . Cardiac catheterization  2000  . Cholecystectomy  12/11/2013    DR Andrey CampanileWILSON   . Cholecystectomy N/A 12/11/2013    Procedure: LAPAROSCOPIC CHOLECYSTECTOMY WITH INTRAOPERATIVE CHOLANGIOGRAM;  Surgeon: Atilano InaEric M Wilson, MD;  [facility];  Service: General;  Laterality: N/A;   Family History  Problem Relation Age of Onset  . Hypertension Mother   . Hypertension Father   . Cancer Other   . Hypertension Other    Social History  Substance Use Topics  . Smoking status: Current Every Day Smoker -- 0.25 packs/day for 35 years    Types: Cigarettes  . Smokeless tobacco: Never Used  . Alcohol Use: No    Review of Systems  Constitutional: Positive for diaphoresis. Negative for fever and chills.  HENT: Negative for congestion and rhinorrhea.   Eyes: Negative for visual disturbance.  Respiratory: Positive for shortness of breath.   Cardiovascular: Positive for chest pain. Negative for palpitations and leg swelling.  Gastrointestinal: Positive for nausea and vomiting.  Genitourinary: Negative for dysuria and difficulty urinating.  Musculoskeletal: Negative for back pain and neck pain.  Skin: Negative for pallor and rash.  Neurological: Negative for dizziness and headaches.  Psychiatric/Behavioral: Negative for confusion.  All other systems reviewed and are negative.     Allergies  Bee venom  Home Medications   Prior to Admission medications   Medication Sig Start Date End Date Taking? Authorizing Provider  buPROPion (WELLBUTRIN SR) 150 MG 12 hr tablet Take 300 mg by mouth daily.     Historical Provider, MD  diazepam (VALIUM) 5 MG tablet Take 5 mg by mouth every 8 (eight) hours as needed for anxiety.     Historical Provider, MD  doxazosin (CARDURA)  1 MG tablet Take 1 mg by mouth daily.    Historical Provider, MD  EPINEPHrine (EPIPEN 2-PAK) 0.3 mg/0.3 mL IJ SOAJ injection Inject 0.3 mLs (0.3 mg total) into the muscle once. 01/12/15   Geoffery Lyons, MD  fenofibrate (TRICOR) 145 MG tablet Take 145 mg by mouth daily.    Historical Provider, MD  gemfibrozil (LOPID) 600 MG tablet Take 600 mg by mouth 2 (two) times daily before a meal.    Historical Provider, MD  hydrALAZINE (APRESOLINE) 50 MG tablet Take 100 mg by mouth 2 (two) times daily.  11/26/13   Historical Provider, MD  labetalol (NORMODYNE) 200 MG tablet Take 1 tablet (200 mg total) by mouth 2 (two) times daily. 10/06/11 12/05/13  Vassie Loll, MD  labetalol (NORMODYNE) 200 MG tablet Take 200 mg by mouth daily.    Historical Provider, MD  levothyroxine (SYNTHROID, LEVOTHROID) 200 MCG tablet Take 1 tablet (200 mcg total) by mouth daily. -Take medication on an empty stomach and at least 40 minutes away from any other medications. 10/06/11   Vassie Loll, MD  nitroGLYCERIN (NITROSTAT) 0.4 MG SL tablet Place 0.4 mg under the tongue every 5 (five) minutes as needed for chest pain.    Historical Provider, MD  omega-3 acid ethyl esters (LOVAZA) 1 G capsule Take 4 g by mouth daily.    Historical Provider, MD  OxyCODONE HCl ER 30 MG T12A Take 1 tablet by mouth every 8 (eight) hours as needed (pain).     Historical Provider, MD  predniSONE (DELTASONE) 10 MG tablet Take 2 tablets (20 mg total) by mouth 2 (two) times daily. 01/12/15   Geoffery Lyons, MD  promethazine (PHENERGAN) 12.5 MG tablet Take 1 tablet (12.5 mg total) by mouth every 6 (six) hours as needed for nausea or vomiting. Patient not taking: Reported on 01/12/2015 12/04/13   Gaynelle Adu, MD  QUEtiapine (SEROQUEL) 300 MG tablet Take 300 mg by mouth at bedtime.    Historical Provider, MD   BP 155/97 mmHg  Pulse 79  Temp(Src) 98 F (36.7 C) (Oral)  Resp 20  Ht 5\' 9"  (1.753 m)  Wt 86.183 kg  BMI 28.05 kg/m2  SpO2 98% Physical Exam  Constitutional:  He is oriented to person, place, and time. He appears well-developed and well-nourished.  HENT:  Head: Normocephalic and atraumatic.  Eyes: EOM are normal. Pupils are equal, round, and reactive to light.  Neck: Normal range of motion. Neck supple.  Cardiovascular: Normal rate, regular rhythm and intact distal pulses.   Pulmonary/Chest: Effort normal and breath sounds normal. No respiratory distress.  Abdominal: Soft. He exhibits no distension. There is no tenderness.  Musculoskeletal: Normal range of motion. He exhibits no edema or tenderness.  Neurological: He is alert and oriented to person, place, and time.  Skin: Skin is warm and dry. No rash noted.  Psychiatric: He has a normal mood and affect.  Nursing note and vitals reviewed.   ED Course  Procedures (including critical care time) Labs Review Labs Reviewed  BASIC METABOLIC PANEL - Abnormal; Notable for the following:  Glucose, Bld 130 (*)    All other components within normal limits  CBC  I-STAT TROPOININ, ED  I-STAT TROPOININ, ED    Imaging Review Dg Chest 2 View  01/22/2016  CLINICAL DATA:  History of intermittent angina for many years, worsening of pain today radiating into left arm. EXAM: CHEST  2 VIEW COMPARISON:  Chest x-rays dated 10/04/2011 and 10/12/2007. FINDINGS: Heart size is normal. Overall cardiomediastinal silhouette remains normal in size and configuration. Lungs are clear. No evidence of pneumonia. No pleural effusion or pneumothorax seen. Surgical clips overlie the lower neck. Mild degenerative spurring again noted within the thoracic spine. No acute- appearing osseous abnormality. IMPRESSION: Lungs are clear and there is no evidence of acute cardiopulmonary abnormality. Electronically Signed   By: Bary Richard M.D.   On: 01/22/2016 16:50   I have personally reviewed and evaluated these images and lab results as part of my medical decision-making.   EKG Interpretation   Date/Time:  Saturday Jan 22 2016  15:40:13 EDT Ventricular Rate:  78 PR Interval:  154 QRS Duration: 96 QT Interval:  380 QTC Calculation: 433 R Axis:   73 Text Interpretation:  Normal sinus rhythm Normal ECG No significant change  since last tracing Confirmed by YAO  MD, DAVID ([id number]) on 01/22/2016  3:45:45 PM Also confirmed by Silverio Lay  MD, DAVID ([id number]), editor Dan Humphreys, CCT,  SANDRA ([id number])  on 01/22/2016 4:35:38 PM      MDM   Final diagnoses:  Chest pain, unspecified chest pain type  Chronic hypertension    Patient is a 55 year old male with past medical history of hypertension and hyperlipidemia who presents complaining of exertional chest pain and left arm discomfort.   The patient is afebrile, mildly hypertensive with otherwise stable vital signs. Exam with some point tenderness over the left lateral chest where patient reports he fell 2-3 weeks ago. Patient states chest discomfort has resolved on arrival, but continues to have left arm tingling. Full dose aspirin given.  Patient does not have unilateral leg swelling or tenderness or prior history of bleeding or clotting problems. His overall low risk by well's criteria. Doubt PE. No signs or symptoms of pneumonia, normal chest x-ray and no leukocytosis, doubt infectious causes of chest pain. Patient is overall low risk for aortic dissection and does not have physical findings consistent with this diagnosis. Patient is overall low risk for ACS with a hear score of 3. Plan to rule out ACS with delta troponin.  While awaiting second troponin patient's blood pressure increased and he was given his by mouth home blood pressure medicines, doxazosin and labetalol. On reassessment, Patient had transient improvement in blood pressure but remained elevated with systolics in the 180s to 200s. Patient was asymptomatic at this time and denied headache, dizziness, shortness of breath, chest pain or any other symptoms.  Delta troponin is undetectable 2. Patient is experiencing an  acute coronary event at this time. She is stable for discharge home.  Patient was discharged in stable condition. Strict return precautions were discussed with patient and family. Will return for worsening chest pain particularly if associated with syncope, shortness of breath or other new or worsening symptoms. Advised patient to follow up with his primary doctor for blood pressure recheck and cardiologist for further outpatient workup if he continues to have exertional chest pain. Patient and family are in agreement with plan.   This patient was seen and discussed with my attending, Dr. Shelly Bombard  Flo Shanks, MD 01/23/16 1610  Richardean Canal, MD 01/26/16 2100

## 2016-01-22 NOTE — ED Notes (Signed)
Patient transported to X-ray 

## 2016-01-22 NOTE — ED Notes (Signed)
pts blood pressure remains elevated er md notifed, patient states that he feels fine and wants to go home, states normally he can tell when his blood pressure is elevated and it doesn't feel that way now, er md notified

## 2016-02-14 ENCOUNTER — Other Ambulatory Visit: Payer: Self-pay | Admitting: Geriatric Medicine

## 2016-02-14 DIAGNOSIS — F17211 Nicotine dependence, cigarettes, in remission: Secondary | ICD-10-CM

## 2016-02-14 DIAGNOSIS — Z87891 Personal history of nicotine dependence: Secondary | ICD-10-CM

## 2016-03-09 ENCOUNTER — Other Ambulatory Visit: Payer: Medicare Other

## 2016-04-05 ENCOUNTER — Other Ambulatory Visit (HOSPITAL_COMMUNITY): Payer: Self-pay | Admitting: Nurse Practitioner

## 2016-04-05 ENCOUNTER — Ambulatory Visit (HOSPITAL_COMMUNITY)
Admission: RE | Admit: 2016-04-05 | Discharge: 2016-04-05 | Disposition: A | Discharge status: Home / Self Care | Payer: Medicare Other | Source: Ambulatory Visit | Attending: Nurse Practitioner | Admitting: Nurse Practitioner

## 2016-04-05 DIAGNOSIS — R609 Edema, unspecified: Secondary | ICD-10-CM

## 2016-04-05 NOTE — Progress Notes (Signed)
VASCULAR LAB PRELIMINARY  PRELIMINARY  PRELIMINARY  PRELIMINARY  Right upper extremity venous duplex has been completed.    Right arm - No evidence of DVT or superficial thrombosis.    Call Vann,PA with result spoke with the nurse @1 :40 pm.  Jenetta Loges, RVT, RDMS 04/05/2016, 1:42 PM

## 2016-04-10 ENCOUNTER — Other Ambulatory Visit: Payer: Medicare Other

## 2016-04-18 ENCOUNTER — Ambulatory Visit (INDEPENDENT_AMBULATORY_CARE_PROVIDER_SITE_OTHER): Payer: Medicare Other | Admitting: Interventional Cardiology

## 2016-04-18 ENCOUNTER — Encounter: Payer: Self-pay | Admitting: Interventional Cardiology

## 2016-04-18 VITALS — BP 162/98 | HR 79 | Ht 69.0 in | Wt 184.1 lb

## 2016-04-18 DIAGNOSIS — R0683 Snoring: Secondary | ICD-10-CM | POA: Diagnosis not present

## 2016-04-18 DIAGNOSIS — I10 Essential (primary) hypertension: Secondary | ICD-10-CM

## 2016-04-18 DIAGNOSIS — F319 Bipolar disorder, unspecified: Secondary | ICD-10-CM

## 2016-04-18 DIAGNOSIS — E785 Hyperlipidemia, unspecified: Secondary | ICD-10-CM

## 2016-04-18 DIAGNOSIS — E0500 Thyrotoxicosis with diffuse goiter without thyrotoxic crisis or storm: Secondary | ICD-10-CM

## 2016-04-18 DIAGNOSIS — R072 Precordial pain: Secondary | ICD-10-CM | POA: Diagnosis not present

## 2016-04-18 MED ORDER — SPIRONOLACTONE 25 MG PO TABS: 25.0000 mg | ORAL_TABLET | Freq: Every day | ORAL | 12 refills | Status: DC

## 2016-04-18 NOTE — Progress Notes (Signed)
Cardiology Office Note    Date:  04/18/2016   ID:  Connor AlbeeGrant M Canepa, DOB November 30, 1960, MRN [id number]  PCP:  Ginette OttoSTONEKING,HAL THOMAS, MD  Cardiologist: Lesleigh NoeHenry W Smith III, MD   Chief Complaint  Patient presents with  . Chest Pain  . Follow-up    Hypertension    History of Present Illness:  [name] is a 55 y.o. male for evaluation of recurring chest pain over the last 15 years. Catheterization by Dr. Meade MawHelen Preston did not reveal significant obstructive disease greater than 10 years ago. Recent emergency room visit for chest discomfort with negative cardiac markers but severe hypertension noted.  Ten-year history of recurring sharp left chest discomfort that can occur with physical activity and psychological stress. Recent hospital emergency room visit with chest pain and elevated blood pressure. No current chest discomfort. Medication adjustment was made in the ER increasing Normodyne to 300 mg twice a day and adding amlodipine. He has no side effects since this medication was added 2 months ago.  His father has aortic valve disease. Does no family history of aortic disease.    Past Medical History:  Diagnosis Date  . Anginal pain (HCC) 2000   takes NTG prn  . Arthritis    neck and knee  . Bipolar 1 disorder (HCC)   . Cervical disc syndrome   . Depression   . GERD (gastroesophageal reflux disease)   . Grave's disease   . H/O prostatitis   . Hiatal hernia   . Hyperlipidemia   . Hypertension   . Lumbar degenerative disc disease     Past Surgical History:  Procedure Laterality Date  . CARDIAC CATHETERIZATION  2000  . CHOLECYSTECTOMY  12/11/2013   DR Andrey CampanileWILSON   . CHOLECYSTECTOMY N/A 12/11/2013   Procedure: LAPAROSCOPIC CHOLECYSTECTOMY WITH INTRAOPERATIVE CHOLANGIOGRAM;  Surgeon: Atilano InaEric M Wilson, MD;  [facility];  Service: General;  Laterality: N/A;  . KNEE SURGERY    . testicular torsion surgery    . THYROIDECTOMY      Current Medications: Outpatient Medications  Prior to Visit  Medication Sig Dispense Refill  . buPROPion (WELLBUTRIN XL) 300 MG 24 hr tablet Take 300 mg by mouth daily.    . diazepam (VALIUM) 5 MG tablet Take 5 mg by mouth every 8 (eight) hours as needed for anxiety.     Marland Kitchen. doxazosin (CARDURA) 4 MG tablet Take 4 mg by mouth 2 (two) times daily.    Marland Kitchen. EPINEPHrine (EPIPEN 2-PAK) 0.3 mg/0.3 mL IJ SOAJ injection Inject 0.3 mLs (0.3 mg total) into the muscle once. 1 Device 1  . labetalol (NORMODYNE) 300 MG tablet Take 300 mg by mouth 2 (two) times daily.    . nitroGLYCERIN (NITROSTAT) 0.4 MG SL tablet Place 0.4 mg under the tongue every 5 (five) minutes as needed for chest pain.    . OxyCODONE HCl ER 30 MG T12A Take 1 tablet by mouth 3 (three) times daily as needed (NORMALLY ONCE DAILY).     Marland Kitchen. QUEtiapine (SEROQUEL) 200 MG tablet Take 200 mg by mouth at bedtime.    . fenofibrate (TRICOR) 145 MG tablet Take 145 mg by mouth daily.    Marland Kitchen. labetalol (NORMODYNE) 200 MG tablet Take 1 tablet (200 mg total) by mouth 2 (two) times daily. 60 tablet 1  . levothyroxine (SYNTHROID, LEVOTHROID) 200 MCG tablet Take 1 tablet (200 mcg total) by mouth daily. -Take medication on an empty stomach and at least 40 minutes away from any other medications. (Patient  not taking: Reported on 04/18/2016) 30 tablet 1  . oxycodone (ROXICODONE) 30 MG immediate release tablet Take 30 mg by mouth See admin instructions. TAKES 1 TAB DAILY, BUT CAN TAKE MORE AS NEEDED OR PAIN  0  . predniSONE (DELTASONE) 10 MG tablet Take 2 tablets (20 mg total) by mouth 2 (two) times daily. (Patient not taking: Reported on 04/18/2016) 12 tablet 0  . promethazine (PHENERGAN) 12.5 MG tablet Take 1 tablet (12.5 mg total) by mouth every 6 (six) hours as needed for nausea or vomiting. (Patient not taking: Reported on 01/12/2015) 30 tablet 0  . QUEtiapine (SEROQUEL) 50 MG tablet Take 100 mg by mouth daily.     No facility-administered medications prior to visit.      Allergies:   Bee venom   Social History    Social History  . Marital status: Legally Separated    Spouse name: N/A  . Number of children: N/A  . Years of education: N/A   Social History Main Topics  . Smoking status: Former Smoker    Packs/day: 0.25    Years: 35.00    Types: Cigarettes    Quit date: 01/28/2015  . Smokeless tobacco: Never Used  . Alcohol use No  . Drug use: No  . Sexual activity: No   Other Topics Concern  . None   Social History Narrative  . None     Family History:  The patient's family history includes Cancer in his other; Hypertension in his father, mother, and other.   ROS:   Please see the history of present illness.    Graves' disease, spontaneous episodes of diaphoresis with minimal activity occurring for years. Depression. History of back pain and dizziness. Also history of snoring, anxiety, difficulty urinating, and headaches.  All other systems reviewed and are negative.   PHYSICAL EXAM:   VS:  BP (!) 162/98   Pulse 79   Ht 5\' 9"  (1.753 m)   Wt 184 lb 1.9 oz (83.5 kg)   BMI 27.19 kg/m    GEN: Well nourished, well developed, in no acute distress  HEENT: normal  Neck: no JVD, carotid bruits, or masses Cardiac: RRR; no murmurs, rubs, or gallops,no edema  Respiratory:  clear to auscultation bilaterally, normal work of breathing GI: soft, nontender, nondistended, + BS MS: no deformity or atrophy  Skin: warm and dry, no rash Neuro:  Alert and Oriented x 3, Strength and sensation are intact Psych: euthymic mood, full affect  Wt Readings from Last 3 Encounters:  04/18/16 184 lb 1.9 oz (83.5 kg)  01/22/16 190 lb (86.2 kg)  01/12/15 175 lb (79.4 kg)      Studies/Labs Reviewed:   EKG:  EKG  Not repeated. When recently performed in the emergency department, there was prominent voltage. No ST-T wave abnormality was noted.  Recent Labs: 01/22/2016: BUN 16; Creatinine, Ser 1.02; Hemoglobin 13.8; Platelets 318; Potassium 4.3; Sodium 141   Lipid Panel    Component Value Date/Time     CHOL 300 (H) 10/04/2011 0545   TRIG 369 (H) 10/04/2011 0545   HDL 42 10/04/2011 0545   CHOLHDL 7.1 10/04/2011 0545   VLDL 74 (H) 10/04/2011 0545   LDLCALC 184 (H) 10/04/2011 0545    Additional studies/ records that were reviewed today include:  Review data from recent ER visit. No recent laboratory data to evaluate lipids and this system.    ASSESSMENT:    1. Precordial pain   2. Essential hypertension   3. Hyperlipidemia  4. Snoring   5. Graves' disease   6. Bipolar 1 disorder (HCC)      PLAN:  In order of problems listed above:  1. Chest CT with contrast to rule out aneurysm/dissection. A pharmacologic Myoview to exclude coronary artery disease 2. 2 g sodium diet and add Aldactone 25 mg per day. We'll need clinical follow-up for further medication titration to achieve blood pressure control. 3. Probably needs to have lipids treated. [facility]. Will check with primary care physician, Dr. Merlene LaughterHal Stoneking to get information concerning recent values. 4. If blood pressure remains significantly elevated, we will need to consider a sleep study to rule out impact of sleep apnea on blood pressure control.    Medication Adjustments/Labs and Tests Ordered: Current medicines are reviewed at length with the patient today.  Concerns regarding medicines are outlined above.  Medication changes, Labs and Tests ordered today are listed in the Patient Instructions below. Patient Instructions  Medication Instructions:   START SPIRONOLACTONE 25 MG ONCE DAILY  Labwork:  Your physician recommends that you return for lab work in: 7 DAYS AFTER STARTING SPIRONOLACTONE  Testing/Procedures:  Non-Cardiac CT Angiography (CTA), is a special type of CT scan that uses a computer to produce multi-dimensional views of major blood vessels throughout the body. In CT angiography, a contrast material is injected through an IV to help visualize the blood vessels CTA OF THE  CHEST W/WO FOR CHEST PAIN  Your physician has requested that you have a lexiscan myoview. For further information please visit [email or web address]. Please follow instruction sheet, as given.SCHEDULE IN 4 WEEKS      Follow-Up:  Your physician recommends that you schedule a follow-up appointment in: 4-6 WEEKS WITH DR Katrinka BlazingSMITH APP         Signed, Lesleigh NoeHenry W Smith III, MD  04/18/2016 10:20 AM    [facility] [address], [place], [place]  [address] Phone: [phone]; Fax: [phone]

## 2016-04-18 NOTE — Patient Instructions (Addendum)
Medication Instructions:   START SPIRONOLACTONE 25 MG ONCE DAILY  Labwork:  Your physician recommends that you return for lab work in: 7 DAYS AFTER STARTING SPIRONOLACTONE  Testing/Procedures:  Non-Cardiac CT Angiography (CTA), is a special type of CT scan that uses a computer to produce multi-dimensional views of major blood vessels throughout the body. In CT angiography, a contrast material is injected through an IV to help visualize the blood vessels CTA OF THE CHEST W/WO FOR CHEST PAIN  Your physician has requested that you have a lexiscan myoview. For further information please visit [email or web address]. Please follow instruction sheet, as given.SCHEDULE IN 4 WEEKS      Follow-Up:  Your physician recommends that you schedule a follow-up appointment in: 4-6 WEEKS WITH DR Katrinka BlazingSMITH APP

## 2016-04-21 ENCOUNTER — Ambulatory Visit (INDEPENDENT_AMBULATORY_CARE_PROVIDER_SITE_OTHER)
Admission: RE | Admit: 2016-04-21 | Discharge: 2016-04-21 | Disposition: A | Discharge status: Home / Self Care | Payer: Medicare Other | Source: Ambulatory Visit | Attending: Interventional Cardiology | Admitting: Interventional Cardiology

## 2016-04-21 DIAGNOSIS — R072 Precordial pain: Secondary | ICD-10-CM | POA: Diagnosis not present

## 2016-04-21 MED ORDER — IOPAMIDOL (ISOVUE-370) INJECTION 76%
100.0000 mL | Freq: Once | INTRAVENOUS | Status: AC | PRN
  Administered 2016-04-21: 100 mL via INTRAVENOUS

## 2016-04-25 ENCOUNTER — Other Ambulatory Visit (INDEPENDENT_AMBULATORY_CARE_PROVIDER_SITE_OTHER): Payer: Medicare Other

## 2016-04-25 DIAGNOSIS — I10 Essential (primary) hypertension: Secondary | ICD-10-CM

## 2016-04-25 LAB — BASIC METABOLIC PANEL
BUN: 15 mg/dL (ref 7–25)
CALCIUM: 9 mg/dL (ref 8.6–10.3)
CHLORIDE: 107 mmol/L (ref 98–110)
CO2: 23 mmol/L (ref 20–31)
CREATININE: 0.83 mg/dL (ref 0.70–1.33)
GLUCOSE: 119 mg/dL — AB (ref 65–99)
Potassium: 4.1 mmol/L (ref 3.5–5.3)
Sodium: 140 mmol/L (ref 135–146)

## 2016-05-16 ENCOUNTER — Encounter (HOSPITAL_COMMUNITY): Payer: Medicare Other

## 2016-05-30 ENCOUNTER — Ambulatory Visit: Payer: Medicare Other | Admitting: Nurse Practitioner

## 2016-06-15 ENCOUNTER — Encounter (HOSPITAL_COMMUNITY): Payer: Medicare Other

## 2017-12-26 ENCOUNTER — Telehealth: Payer: Self-pay | Admitting: Interventional Cardiology

## 2017-12-26 NOTE — Telephone Encounter (Signed)
Spoke with pt and he states this AM he developed some chest discomfort and SOB.  States it is not a pain, just some discomfort/pressure in the sternum area.  Pt took 2 nitros during the day and had some relief. Denies any other sx. Pt did not take any medications this morning because he had to take his dad out for appts.  Pt just now arriving home and is going to take his medications now.  Spoke with pt about going to ER and he did not wish to go at this time. Pt did not have any vitals because he hasn't been home to check them.  Scheduled pt to see Jacolyn ReedyMichele Lenze, PA-C tomorrow and advised if any changes in his sx he needed to report to ER immediately.  Pt verbalized understanding and was in agreement with this plan.

## 2017-12-26 NOTE — Telephone Encounter (Addendum)
Left message for pt to call back and schedule an OV as he will need to be seen first before we can order a stress test.  Slot held on Jacolyn ReedyMichele Lenze schedule for tomorrow if he is able to come.

## 2017-12-26 NOTE — Telephone Encounter (Signed)
Pt calling to set up fu -was to have a stress test last year but BP was too elevated to do, having some trouble with chest discomfort and would like to know if stress test is needed [facility]. May not be available to answer phone ok to leave message and will call back to schedule

## 2017-12-27 ENCOUNTER — Ambulatory Visit: Payer: Medicare Other | Admitting: Physician Assistant

## 2017-12-27 ENCOUNTER — Encounter: Payer: Self-pay | Admitting: Physician Assistant

## 2017-12-27 VITALS — BP 126/80 | HR 66 | Ht 69.0 in | Wt 165.4 lb

## 2017-12-27 DIAGNOSIS — I10 Essential (primary) hypertension: Secondary | ICD-10-CM | POA: Diagnosis not present

## 2017-12-27 DIAGNOSIS — R079 Chest pain, unspecified: Secondary | ICD-10-CM | POA: Diagnosis not present

## 2017-12-27 DIAGNOSIS — Z720 Tobacco use: Secondary | ICD-10-CM

## 2017-12-27 DIAGNOSIS — E0500 Thyrotoxicosis with diffuse goiter without thyrotoxic crisis or storm: Secondary | ICD-10-CM

## 2017-12-27 DIAGNOSIS — I350 Nonrheumatic aortic (valve) stenosis: Secondary | ICD-10-CM

## 2017-12-27 DIAGNOSIS — E782 Mixed hyperlipidemia: Secondary | ICD-10-CM | POA: Diagnosis not present

## 2017-12-27 DIAGNOSIS — F319 Bipolar disorder, unspecified: Secondary | ICD-10-CM

## 2017-12-27 HISTORY — DX: Mixed hyperlipidemia: E78.2

## 2017-12-27 HISTORY — DX: Tobacco use: Z72.0

## 2017-12-27 NOTE — Progress Notes (Signed)
Cardiology Office Note    Date:  12/27/2017   ID:  [name], DOB 1961-07-16, MRN [id number]  PCP:  Merlene Laughter, MD  Cardiologist: Lesleigh Noe, MD  Chief Complaint  Patient presents with  . Chest Pain    History of Present Illness:  [name] is a 58 y.o. male with history of chest pain for over 15 years.  Cardiac catheterization by Dr. Meade Maw did not reveal any obstructive disease over 12 years ago.    Last saw Dr. Katrinka Blazing 04/2016 and was having recurrent sharp left chest discomfort that occurred with physical activity and psychological stress.  He has also had severe hypertension, HLD, Graves' disease, bipolar 1 disorder.  Dr. Katrinka Blazing ordered chest CT with contrast to rule out aneurysm/dissection which was negative with mild plaque at the aortic arch and left subclavian artery origin.  Suggestion of minimal plaque in the LAD distribution.  And then recommended a pharmacological Myoview which was never done.  Also recommended sleep study for snoring.  Yesterday patient [facility]. He suddenly developed chest pressure at 10 am-he had forgotten to take his BP meds that am. He took a NTG at 11:30 without relief. He took another NTG at 2:00 without relief. Took a pain pill at 6 pm and that's when he started feeling better.Has been doing a lot of yard work-mowing and weed eating on Monday that wore him out.Says it's different from his angina which is a sharp shooting pain. Left arm was bothering him too. He says his tremors were much worse yesterday. Smokes 2 cigars/day. Says he's quitting today. BP has been well controlled. No DM, history of high triglycerides but doesn't take anything and hasn't had it checked. No family history of CAD. Does weights and push ups 20 min/day. Can walk 2-4 miles without problem.  Past Medical History:  Diagnosis Date  . Anginal pain (HCC) 2000   takes NTG prn  . Arthritis    neck and knee  . Bipolar 1  disorder (HCC)   . Cervical disc syndrome   . Depression   . GERD (gastroesophageal reflux disease)   . Grave's disease   . H/O prostatitis   . Hiatal hernia   . Hyperlipidemia   . Hypertension   . Lumbar degenerative disc disease     Past Surgical History:  Procedure Laterality Date  . CARDIAC CATHETERIZATION  2000  . CHOLECYSTECTOMY  12/11/2013   DR Andrey Campanile   . CHOLECYSTECTOMY N/A 12/11/2013   Procedure: LAPAROSCOPIC CHOLECYSTECTOMY WITH INTRAOPERATIVE CHOLANGIOGRAM;  Surgeon: Atilano Ina, MD;  Location: [facility];  Service: General;  Laterality: N/A;  . KNEE SURGERY    . testicular torsion surgery    . THYROIDECTOMY      Current Medications: Current Meds  Medication Sig  . amLODipine (NORVASC) 5 MG tablet Take 5 mg by mouth daily.  Marland Kitchen buPROPion (WELLBUTRIN XL) 150 MG 24 hr tablet Take 450 mg by mouth daily.  . diazepam (VALIUM) 5 MG tablet Take 5 mg by mouth every 8 (eight) hours as needed for anxiety.   Marland Kitchen doxazosin (CARDURA) 4 MG tablet Take 4 mg by mouth 2 (two) times daily.  Marland Kitchen EPINEPHrine (EPIPEN 2-PAK) 0.3 mg/0.3 mL IJ SOAJ injection Inject 0.3 mLs (0.3 mg total) into the muscle once.  . gabapentin (NEURONTIN) 100 MG capsule Take 100 mg by mouth 2 (two) times daily.  Marland Kitchen labetalol (NORMODYNE) 300 MG tablet Take 300  mg by mouth 2 (two) times daily.  Marland Kitchen levothyroxine (SYNTHROID, LEVOTHROID) 150 MCG tablet Take 150 mcg by mouth daily before breakfast.  . lisinopril (PRINIVIL,ZESTRIL) 10 MG tablet Take 10 mg by mouth daily.  . nitroGLYCERIN (NITROSTAT) 0.4 MG SL tablet Place 0.4 mg under the tongue every 5 (five) minutes as needed for chest pain.  . OxyCODONE HCl ER 30 MG T12A Take 1 tablet by mouth 3 (three) times daily as needed (NORMALLY ONCE DAILY).   Marland Kitchen QUEtiapine (SEROQUEL) 200 MG tablet Take 200 mg by mouth at bedtime.  . ziprasidone (GEODON) 40 MG capsule Take 40 mg by mouth daily.  . [DISCONTINUED] buPROPion (WELLBUTRIN XL) 300 MG 24 hr tablet Take 300 mg by mouth daily.       Allergies:   Bee venom   Social History   Socioeconomic History  . Marital status: Legally Separated    Spouse name: Not on file  . Number of children: Not on file  . Years of education: Not on file  . Highest education level: Not on file  Occupational History  . Not on file  Social Needs  . Financial resource strain: Not on file  . Food insecurity:    Worry: Not on file    Inability: Not on file  . Transportation needs:    Medical: Not on file    Non-medical: Not on file  Tobacco Use  . Smoking status: Former Smoker    Packs/day: 0.25    Years: 35.00    Pack years: 8.75    Types: Cigarettes    Last attempt to quit: 01/28/2015    Years since quitting: 2.9  . Smokeless tobacco: Never Used  Substance and Sexual Activity  . Alcohol use: No  . Drug use: No  . Sexual activity: Never  Lifestyle  . Physical activity:    Days per week: Not on file    Minutes per session: Not on file  . Stress: Not on file  Relationships  . Social connections:    Talks on phone: Not on file    Gets together: Not on file    Attends religious service: Not on file    Active member of club or organization: Not on file    Attends meetings of clubs or organizations: Not on file    Relationship status: Not on file  Other Topics Concern  . Not on file  Social History Narrative  . Not on file     Family History:  The patient's family history includes Cancer in his other; Hypertension in his father, mother, and other; Valvular heart disease in his father.   ROS:   Please see the history of present illness.    Review of Systems  Constitution: Negative.  HENT: Negative.   Cardiovascular: Positive for chest pain.  Respiratory: Negative.   Endocrine: Negative.   Hematologic/Lymphatic: Negative.   Musculoskeletal: Negative.   Gastrointestinal: Negative.   Genitourinary: Negative.   Neurological: Positive for tremors.  Psychiatric/Behavioral:       Bipolar disorder managed by Dr.  Pete Glatter   All other systems reviewed and are negative.   PHYSICAL EXAM:   VS:  BP 126/80   Pulse 66   Ht 5\' 9"  (1.753 m)   Wt 165 lb 6.4 oz (75 kg)   SpO2 97%   BMI 24.43 kg/m   Physical Exam  GEN: Well nourished, well developed, in no acute distress  Neck: no JVD, carotid bruits, or masses Cardiac:RRR; 2/6 systolic murmur at  the left sternal border radiating to carotids  respiratory: Decreased breath sounds but clear to auscultation bilaterally, normal work of breathing GI: soft, nontender, nondistended, + BS Ext: without cyanosis, clubbing, or edema, Good distal pulses bilaterally Neuro:  Alert and Oriented x 3,  Psych: euthymic mood, full affect  Wt Readings from Last 3 Encounters:  12/27/17 165 lb 6.4 oz (75 kg)  04/18/16 184 lb 1.9 oz (83.5 kg)  01/22/16 190 lb (86.2 kg)      Studies/Labs Reviewed:   EKG:  EKG is  ordered today.  The ekg ordered today demonstrates normal sinus rhythm, normal EKG, no acute change  Recent Labs: No results found for requested labs within last 8760 hours.   Lipid Panel    Component Value Date/Time   CHOL 300 (H) 10/04/2011 0545   TRIG 369 (H) 10/04/2011 0545   HDL 42 10/04/2011 0545   CHOLHDL 7.1 10/04/2011 0545   VLDL 74 (H) 10/04/2011 0545   LDLCALC 184 (H) 10/04/2011 0545    Additional studies/ records that were reviewed today include:  CT 04/2016 IMPRESSION: 1. Unremarkable CTA of the chest showing no evidence of thoracic aortic aneurysm or dissection. Mild plaque is present at the level of the aortic arch and left subclavian artery origin. Suggestion of minimal calcified plaque in the distribution of the LAD. 2. Evidence of hepatic steatosis.     Electronically Signed   By: Irish Lack M.D.   On: 04/21/2016 09:16   2D echo 2013Study Conclusions  Left ventricle: The cavity size was normal. Systolic function was normal. The estimated ejection fraction was in the range of 50% to 55%. Wall motion was normal;  there were no regional wall motion abnormalities. Transthoracic echocardiography.  M-mode, complete 2D, spectral Doppler, and color Doppler.  Height:  Height: 172.7cm. Height: 68in.  Weight:  Weight: 88.8kg. Weight: 195.4lb.  Body mass index:  BMI: 29.8kg/m^2.  Body surface area:    BSA: 2.25m^2.  Blood pressure:     134/72.  Patient status:  Inpatient.  Location:  Bedside.    ASSESSMENT:    1. Chest pain, unspecified type   2. Aortic valve stenosis, etiology of cardiac valve disease unspecified   3. Essential hypertension   4. Mixed hyperlipidemia   5. Graves' disease   6. Tobacco abuse   7. Bipolar 1 disorder (HCC)      PLAN:  In order of problems listed above:  Chest pain somewhat atypical unrelieved with nitroglycerin and got relief from pain medication.  No pain today.  Left arm has been bothering him for a while now.  Has history of normal cardiac catheterization approximately 12 years ago.  Normal EKG today.  Recommend coronary CT with calcium score.  Risk factor modification such as smoking cessation, control of lipids.  Follow-up with Dr. Katrinka Blazing after CT.  Systolic murmur consistent with aortic valve disease.  Will check 2D echo  Hypertension well-controlled on labetalol and Cardura and Norvasc.  Mixed hyperlipidemia last lipid panel in 2017 LDL 106 cholesterol 230 triglycerides 397.  We will repeat full labs.  Graves' disease status post thyroidectomy with residual essential tremor  Tobacco abuse smoking cessation recommended.  Patient did quit and restarted 2 years ago.  Bipolar disorder managed by Dr. Pete Glatter  Medication Adjustments/Labs and Tests Ordered: Current medicines are reviewed at length with the patient today.  Concerns regarding medicines are outlined above.  Medication changes, Labs and Tests ordered today are listed in the Patient Instructions below. Patient Instructions  Medication Instructions:  Your physician recommends that you continue on  your current medications as directed. Please refer to the Current Medication list given to you today.   Labwork: Your physician recommends that you return for a FASTING lipid profile, complete metabolic panel, complete blood count   Testing/Procedures: Your physician has requested that you have an echocardiogram. Echocardiography is a painless test that uses sound waves to create images of your heart. It provides your doctor with information about the size and shape of your heart and how well your heart's chambers and valves are working. This procedure takes approximately one hour. There are no restrictions for this procedure.  Your physician has requested that you have cardiac CT. Cardiac computed tomography (CT) is a painless test that uses an x-ray machine to take clear, detailed pictures of your heart. For further information please visit [email or web address] Please follow instruction sheet as given.  Follow-Up: Your physician recommends that you schedule a follow-up appointment with Dr. Katrinka Blazing after the Cardiac CT.   Any Other Special Instructions Will Be Listed Below (If Applicable).  Cardiac CT Instructions  Please arrive at the [facility] main entrance of [facility] on ________at _______(30-45 minutes prior to test start time)  [facility] [address] [place], [place] [address] [phone]  Proceed to the [facility] Radiology Department (First Floor).  Please follow these instructions carefully (unless otherwise directed):  Hold all erectile dysfunction medications at least 48 hours prior to test.  On the Night Before the Test: . Drink plenty of water. . Do not consume any caffeinated/decaffeinated beverages or chocolate 12 hours prior to your test. . Do not take any antihistamines 12 hours prior to your test.  On the Day of the Test: . Drink plenty of water. Do not drink any water within one hour of the test. . Do not eat any food 4 hours  prior to the test. . You may take your regular medications prior to the test. . BE SURE TO TAKE YOUR LABETALOL  After the Test: . Drink plenty of water. . After receiving IV contrast, you may experience a mild flushed feeling. This is normal. . On occasion, you may experience a mild rash up to 24 hours after the test. This is not dangerous. If this occurs, you can take Benadryl 25 mg and increase your fluid intake. . If you experience trouble breathing, this can be serious. If it is severe call 911 IMMEDIATELY. If it is mild, please call our office.      Echocardiogram An echocardiogram, or echocardiography, uses sound waves (ultrasound) to produce an image of your heart. The echocardiogram is simple, painless, obtained within a short period of time, and offers valuable information to your health care provider. The images from an echocardiogram can provide information such as:  Evidence of coronary artery disease (CAD).  Heart size.  Heart muscle function.  Heart valve function.  Aneurysm detection.  Evidence of a past heart attack.  Fluid buildup around the heart.  Heart muscle thickening.  Assess heart valve function.  Tell a health care provider about:  Any allergies you have.  All medicines you are taking, including vitamins, herbs, eye drops, creams, and over-the-counter medicines.  Any problems you or family members have had with anesthetic medicines.  Any blood disorders you have.  Any surgeries you have had.  Any medical conditions you have.  Whether you are pregnant or may be pregnant. What happens before the procedure? No special preparation  is needed. Eat and drink normally. What happens during the procedure?  In order to produce an image of your heart, gel will be applied to your chest and a wand-like tool (transducer) will be moved over your chest. The gel will help transmit the sound waves from the transducer. The sound waves will harmlessly bounce  off your heart to allow the heart images to be captured in real-time motion. These images will then be recorded.  You may need an IV to receive a medicine that improves the quality of the pictures. What happens after the procedure? You may return to your normal schedule including diet, activities, and medicines, unless your health care provider tells you otherwise. This information is not intended to replace advice given to you by your health care provider. Make sure you discuss any questions you have with your health care provider. Document Released: 08/18/2000 Document Revised: 04/08/2016 Document Reviewed: 04/28/2013 Elsevier Interactive Patient Education  2017 [employer]      If you need a refill on your cardiac medications before your next appointment, please call your pharmacy.     Elson ClanSigned, Cumi Sanagustin, PA-C  12/27/2017 8:19 AM    [facility] [address], [place], [place]  [address] Phone: [phone]; Fax: [phone]

## 2017-12-27 NOTE — Patient Instructions (Addendum)
Medication Instructions:  Your physician recommends that you continue on your current medications as directed. Please refer to the Current Medication list given to you today.   Labwork: Your physician recommends that you return for a FASTING lipid profile, complete metabolic panel, complete blood count   Testing/Procedures: Your physician has requested that you have an echocardiogram. Echocardiography is a painless test that uses sound waves to create images of your heart. It provides your doctor with information about the size and shape of your heart and how well your heart's chambers and valves are working. This procedure takes approximately one hour. There are no restrictions for this procedure.  Your physician has requested that you have cardiac CT. Cardiac computed tomography (CT) is a painless test that uses an x-ray machine to take clear, detailed pictures of your heart. For further information please visit [email or web address] Please follow instruction sheet as given.  Follow-Up: Your physician recommends that you schedule a follow-up appointment with Dr. Katrinka BlazingSmith after the Cardiac CT.   Any Other Special Instructions Will Be Listed Below (If Applicable).  Cardiac CT Instructions  Please arrive at the [facility] main entrance of [facility] on ________at _______(30-45 minutes prior to test start time)  [facility] [address] [place], [place] [address] [phone]  Proceed to the [facility] Radiology Department (First Floor).  Please follow these instructions carefully (unless otherwise directed):  Hold all erectile dysfunction medications at least 48 hours prior to test.  On the Night Before the Test: . Drink plenty of water. . Do not consume any caffeinated/decaffeinated beverages or chocolate 12 hours prior to your test. . Do not take any antihistamines 12 hours prior to your test.  On the Day of the Test: . Drink plenty of water. Do not  drink any water within one hour of the test. . Do not eat any food 4 hours prior to the test. . You may take your regular medications prior to the test. . BE SURE TO TAKE YOUR LABETALOL  After the Test: . Drink plenty of water. . After receiving IV contrast, you may experience a mild flushed feeling. This is normal. . On occasion, you may experience a mild rash up to 24 hours after the test. This is not dangerous. If this occurs, you can take Benadryl 25 mg and increase your fluid intake. . If you experience trouble breathing, this can be serious. If it is severe call 911 IMMEDIATELY. If it is mild, please call our office.      Echocardiogram An echocardiogram, or echocardiography, uses sound waves (ultrasound) to produce an image of your heart. The echocardiogram is simple, painless, obtained within a short period of time, and offers valuable information to your health care provider. The images from an echocardiogram can provide information such as:  Evidence of coronary artery disease (CAD).  Heart size.  Heart muscle function.  Heart valve function.  Aneurysm detection.  Evidence of a past heart attack.  Fluid buildup around the heart.  Heart muscle thickening.  Assess heart valve function.  Tell a health care provider about:  Any allergies you have.  All medicines you are taking, including vitamins, herbs, eye drops, creams, and over-the-counter medicines.  Any problems you or family members have had with anesthetic medicines.  Any blood disorders you have.  Any surgeries you have had.  Any medical conditions you have.  Whether you are pregnant or may be pregnant. What happens before the procedure? No special preparation is  needed. Eat and drink normally. What happens during the procedure?  In order to produce an image of your heart, gel will be applied to your chest and a wand-like tool (transducer) will be moved over your chest. The gel will help transmit  the sound waves from the transducer. The sound waves will harmlessly bounce off your heart to allow the heart images to be captured in real-time motion. These images will then be recorded.  You may need an IV to receive a medicine that improves the quality of the pictures. What happens after the procedure? You may return to your normal schedule including diet, activities, and medicines, unless your health care provider tells you otherwise. This information is not intended to replace advice given to you by your health care provider. Make sure you discuss any questions you have with your health care provider. Document Released: 08/18/2000 Document Revised: 04/08/2016 Document Reviewed: 04/28/2013 Elsevier Interactive Patient Education  2017 [employer].      If you need a refill on your cardiac medications before your next appointment, please call your pharmacy.

## 2018-01-02 ENCOUNTER — Other Ambulatory Visit: Payer: Self-pay

## 2018-01-02 ENCOUNTER — Ambulatory Visit (HOSPITAL_COMMUNITY): Payer: Medicare Other | Attending: Cardiology

## 2018-01-02 DIAGNOSIS — I350 Nonrheumatic aortic (valve) stenosis: Secondary | ICD-10-CM

## 2018-01-02 DIAGNOSIS — M5136 Other intervertebral disc degeneration, lumbar region: Secondary | ICD-10-CM | POA: Diagnosis not present

## 2018-01-02 DIAGNOSIS — I082 Rheumatic disorders of both aortic and tricuspid valves: Secondary | ICD-10-CM | POA: Diagnosis not present

## 2018-01-02 DIAGNOSIS — I10 Essential (primary) hypertension: Secondary | ICD-10-CM | POA: Diagnosis not present

## 2018-01-02 DIAGNOSIS — E785 Hyperlipidemia, unspecified: Secondary | ICD-10-CM | POA: Insufficient documentation

## 2018-01-10 ENCOUNTER — Telehealth: Payer: Self-pay | Admitting: Physician Assistant

## 2018-01-10 NOTE — Telephone Encounter (Signed)
New Message     I called the patient 01/10/18 to schedule cardiac CT, he states that that he just had an echo and everything came back ok. Does he need to have have this testing done, and what will it show?

## 2018-01-11 NOTE — Telephone Encounter (Signed)
Yes he still needs to have the coronary CT even with a normal echo. The echo looks at the valves and pumping function of the heart. The Coronary CT will tell us if there are any blockages or calcium build up that could be causing his chest pain or lead to a heart attack. Thanks, Elon Jester

## 2018-01-14 NOTE — Telephone Encounter (Signed)
Spoke with pt re: having the CT done.  Pt is agreeable with the plan and has been made aware that someone from the scheduling dept will call him to get this scheduled once they receive a authorization response from his insurance. Pt thanked me for the call.

## 2018-02-11 ENCOUNTER — Other Ambulatory Visit: Payer: Medicare Other

## 2018-02-14 ENCOUNTER — Other Ambulatory Visit: Payer: Medicare Other | Admitting: *Deleted

## 2018-02-14 DIAGNOSIS — I350 Nonrheumatic aortic (valve) stenosis: Secondary | ICD-10-CM

## 2018-02-14 DIAGNOSIS — R079 Chest pain, unspecified: Secondary | ICD-10-CM

## 2018-02-14 LAB — COMPREHENSIVE METABOLIC PANEL
ALBUMIN: 4.4 g/dL (ref 3.5–5.5)
ALT: 17 IU/L (ref 0–44)
AST: 13 IU/L (ref 0–40)
Albumin/Globulin Ratio: 2.1 (ref 1.2–2.2)
Alkaline Phosphatase: 108 IU/L (ref 39–117)
BUN / CREAT RATIO: 23 — AB (ref 9–20)
BUN: 18 mg/dL (ref 6–24)
Bilirubin Total: 0.3 mg/dL (ref 0.0–1.2)
CALCIUM: 8.9 mg/dL (ref 8.7–10.2)
CO2: 24 mmol/L (ref 20–29)
CREATININE: 0.77 mg/dL (ref 0.76–1.27)
Chloride: 104 mmol/L (ref 96–106)
GFR calc Af Amer: 118 mL/min/{1.73_m2} (ref >59)
GFR, EST NON AFRICAN AMERICAN: 102 mL/min/{1.73_m2} (ref >59)
GLOBULIN, TOTAL: 2.1 g/dL (ref 1.5–4.5)
Glucose: 96 mg/dL (ref 65–99)
Potassium: 4.2 mmol/L (ref 3.5–5.2)
Sodium: 140 mmol/L (ref 134–144)
TOTAL PROTEIN: 6.5 g/dL (ref 6.0–8.5)

## 2018-02-14 LAB — CBC
HEMATOCRIT: 38.7 % (ref 37.5–51.0)
HEMOGLOBIN: 13.6 g/dL (ref 13.0–17.7)
MCH: 31.5 pg (ref 26.6–33.0)
MCHC: 35.1 g/dL (ref 31.5–35.7)
MCV: 90 fL (ref 79–97)
Platelets: 259 10*3/uL (ref 150–450)
RBC: 4.32 x10E6/uL (ref 4.14–5.80)
RDW: 13 % (ref 12.3–15.4)
WBC: 9.4 10*3/uL (ref 3.4–10.8)

## 2018-02-14 LAB — LIPID PANEL
Chol/HDL Ratio: 3.4 ratio (ref 0.0–5.0)
Cholesterol, Total: 165 mg/dL (ref 100–199)
HDL: 49 mg/dL (ref >39)
LDL Calculated: 95 mg/dL (ref 0–99)
TRIGLYCERIDES: 104 mg/dL (ref 0–149)
VLDL Cholesterol Cal: 21 mg/dL (ref 5–40)

## 2018-02-18 ENCOUNTER — Ambulatory Visit (HOSPITAL_COMMUNITY): Payer: Medicare Other

## 2018-02-22 ENCOUNTER — Ambulatory Visit (HOSPITAL_COMMUNITY)
Admission: RE | Admit: 2018-02-22 | Discharge: 2018-02-22 | Disposition: A | Discharge status: Home / Self Care | Payer: Medicare Other | Source: Ambulatory Visit | Attending: Physician Assistant | Admitting: Physician Assistant

## 2018-02-22 DIAGNOSIS — R079 Chest pain, unspecified: Secondary | ICD-10-CM | POA: Diagnosis present

## 2018-02-22 DIAGNOSIS — I700 Atherosclerosis of aorta: Secondary | ICD-10-CM | POA: Insufficient documentation

## 2018-02-22 MED ORDER — NITROGLYCERIN 0.4 MG SL SUBL
SUBLINGUAL_TABLET | SUBLINGUAL | Status: AC
  Filled 2018-02-22: qty 2

## 2018-02-22 MED ORDER — IOPAMIDOL (ISOVUE-370) INJECTION 76%
100.0000 mL | Freq: Once | INTRAVENOUS | Status: AC | PRN
  Administered 2018-02-22: 80 mL via INTRAVENOUS

## 2018-02-22 MED ORDER — METOPROLOL TARTRATE 5 MG/5ML IV SOLN
INTRAVENOUS | Status: AC
  Filled 2018-02-22: qty 10

## 2018-02-22 MED ORDER — NITROGLYCERIN 0.4 MG SL SUBL
0.8000 mg | SUBLINGUAL_TABLET | Freq: Once | SUBLINGUAL | Status: AC
  Administered 2018-02-22: 0.8 mg via SUBLINGUAL

## 2018-02-22 MED ORDER — METOPROLOL TARTRATE 5 MG/5ML IV SOLN
5.0000 mg | INTRAVENOUS | Status: DC | PRN
  Administered 2018-02-22 (×2): 5 mg via INTRAVENOUS

## 2018-02-22 MED ORDER — IOPAMIDOL (ISOVUE-370) INJECTION 76%
INTRAVENOUS | Status: AC
Start: 2018-02-22 — End: 2018-02-22
  Filled 2018-02-22: qty 100

## 2018-02-22 NOTE — Progress Notes (Signed)
Patient ambulated out of department alone with steady gait noted. NAD noted. Denies any complaints at d/c.

## 2018-02-22 NOTE — Progress Notes (Addendum)
Ct complete. Patient drinking ginger ale. Denies any complaints.

## 2018-02-23 DIAGNOSIS — R079 Chest pain, unspecified: Secondary | ICD-10-CM

## 2018-02-26 NOTE — H&P (View-Only) (Signed)
Cardiology Office Note    Date:  02/27/2018   ID:  [name], DOB [birth year], MRN [id number]  PCP:  Merlene Laughter, MD  Cardiologist: Lesleigh Noe, MD  Chief Complaint  Patient presents with  . Follow-up    History of Present Illness:  [name] is a 57 y.o. male with history of chest pain for over 15 years. Cardiac catheterization by Dr.Helen Fraser Din did not reveal any obstructive disease over 12 years ago.   Last saw Dr. Katrinka Blazing 04/2016 and was having recurrent sharp left chest discomfort that occurred with physical activity and psychological stress. He has also had severe hypertension, HLD, Graves' disease, bipolar 1 disorder. Dr. Katrinka Blazing ordered chest CT with contrast to rule out aneurysm/dissection which was negative with mild plaque at the aortic arch and left subclavian artery origin. Suggestion of minimal plaque in the LAD distribution. And then recommended a pharmacological Myoview which was never done. Also recommended sleep study for snoring.   I saw the patient 12/27/2017 with atypical chest pain after forgetting to take his blood pressure medicines unrelieved with nitroglycerin but eased approximately 6 hours later that day after taking pain medicine.  2D echo 01/02/2018 showed normal LV EF 55 to 60% with aortic valve sclerosis no stenosis.  Coronary CT calcium score was 463 which was 95th percentile for his age and gender suggesting high risk for future cardiac events.  There was possible moderate distal RCA stenosis, moderate mid LAD stenosis, moderate diagonal 1 stenosis and moderate OM1 stenosis.  It was sent for FFR but there was too much artifact to perform FFR.  4 days ago while cleaning pool in the heat he had a sharp pain and tightness-relieved with NTG. Was also waking up with chest tightness/sharp pain down left arm but this is doing better.  Some chest tightness with activity usually relieved with rest and not requiring nitroglycerin.  Quit smoking.  Past  Medical History:  Diagnosis Date  . Anginal pain (HCC) 2000   takes NTG prn  . Arthritis    neck and knee  . Bipolar 1 disorder (HCC)   . Cervical disc syndrome   . Chest pain 10/04/2011  . Depression   . GERD (gastroesophageal reflux disease)   . Grave's disease   . Graves' disease 10/04/2011  . H/O prostatitis   . Hiatal hernia   . HTN (hypertension) 10/05/2011  . Hyperlipidemia   . Hypertension   . Lumbar degenerative disc disease   . Mixed hyperlipidemia 12/27/2017  . Tobacco abuse 12/27/2017    Past Surgical History:  Procedure Laterality Date  . CARDIAC CATHETERIZATION  2000  . CHOLECYSTECTOMY  12/11/2013   DR Andrey Campanile   . CHOLECYSTECTOMY N/A 12/11/2013   Procedure: LAPAROSCOPIC CHOLECYSTECTOMY WITH INTRAOPERATIVE CHOLANGIOGRAM;  Surgeon: Atilano Ina, MD;  Location: [facility] OR;  Service: General;  Laterality: N/A;  . KNEE SURGERY    . testicular torsion surgery    . THYROIDECTOMY      Current Medications: Current Meds  Medication Sig  . amLODipine (NORVASC) 5 MG tablet Take 5 mg by mouth daily.  Marland Kitchen buPROPion (WELLBUTRIN XL) 150 MG 24 hr tablet Take 450 mg by mouth daily.  . diazepam (VALIUM) 5 MG tablet Take 5 mg by mouth every 8 (eight) hours as needed for anxiety.   Marland Kitchen doxazosin (CARDURA) 4 MG tablet Take 4 mg by mouth 2 (two) times daily.  Marland Kitchen doxepin (SINEQUAN) 10 MG capsule Take 10 mg by mouth at bedtime  as needed.  Marland Kitchen EPINEPHrine (EPIPEN 2-PAK) 0.3 mg/0.3 mL IJ SOAJ injection Inject 0.3 mLs (0.3 mg total) into the muscle once.  . gabapentin (NEURONTIN) 100 MG capsule Take 100 mg by mouth 2 (two) times daily.  Marland Kitchen labetalol (NORMODYNE) 300 MG tablet Take 300 mg by mouth 2 (two) times daily.  Marland Kitchen levothyroxine (SYNTHROID, LEVOTHROID) 150 MCG tablet Take 150 mcg by mouth daily before breakfast.  . lisinopril (PRINIVIL,ZESTRIL) 10 MG tablet Take 10 mg by mouth daily.  . nitroGLYCERIN (NITROSTAT) 0.4 MG SL tablet Place 0.4 mg under the tongue every 5 (five) minutes as needed for  chest pain.  . OxyCODONE HCl ER 30 MG T12A Take 1 tablet by mouth 3 (three) times daily as needed (NORMALLY ONCE DAILY).   Marland Kitchen QUEtiapine (SEROQUEL) 200 MG tablet Take 200 mg by mouth at bedtime.  . ziprasidone (GEODON) 40 MG capsule Take 40 mg by mouth daily.     Allergies:   Bee venom   Social History   Socioeconomic History  . Marital status: Legally Separated    Spouse name: Not on file  . Number of children: Not on file  . Years of education: Not on file  . Highest education level: Not on file  Occupational History  . Not on file  Social Needs  . Financial resource strain: Not on file  . Food insecurity:    Worry: Not on file    Inability: Not on file  . Transportation needs:    Medical: Not on file    Non-medical: Not on file  Tobacco Use  . Smoking status: Former Smoker    Packs/day: 0.25    Years: 35.00    Pack years: 8.75    Types: Cigarettes    Last attempt to quit: 01/28/2015    Years since quitting: 3.0  . Smokeless tobacco: Never Used  Substance and Sexual Activity  . Alcohol use: No  . Drug use: No  . Sexual activity: Never  Lifestyle  . Physical activity:    Days per week: Not on file    Minutes per session: Not on file  . Stress: Not on file  Relationships  . Social connections:    Talks on phone: Not on file    Gets together: Not on file    Attends religious service: Not on file    Active member of club or organization: Not on file    Attends meetings of clubs or organizations: Not on file    Relationship status: Not on file  Other Topics Concern  . Not on file  Social History Narrative  . Not on file     Family History:  The patient's family history includes Cancer in his other; Hypertension in his father, mother, and other; Valvular heart disease in his father.   ROS:   Please see the history of present illness.    Review of Systems  Constitution: Negative.  HENT: Negative.   Cardiovascular: Positive for chest pain and dyspnea on  exertion.  Respiratory: Negative.   Endocrine: Negative.   Hematologic/Lymphatic: Negative.   Skin: Negative for poor wound healing.  Musculoskeletal: Negative.   Gastrointestinal: Negative.   Genitourinary: Negative.   Neurological: Negative.    All other systems reviewed and are negative.   PHYSICAL EXAM:   VS:  Pulse 72   Ht 5\' 9"  (1.753 m)   Wt 166 lb (75.3 kg)   SpO2 99%   BMI 24.51 kg/m   Physical Exam  GEN: Well  nourished, well developed, in no acute distress  HEENT: normal  Neck: Bilateral carotid bruits no JVD, or masses Cardiac:RRR; positive S4 and 2/6 systolic murmur at the left sternal border Respiratory:  clear to auscultation bilaterally, normal work of breathing GI: soft, nontender, nondistended, + BS Ext: without cyanosis, clubbing, or edema, Good distal pulses bilaterally MS: no deformity or atrophy  Skin: warm and dry, no rash Neuro:  Alert and Oriented x 3, Strength and sensation are intact Psych: euthymic mood, full affect  Wt Readings from Last 3 Encounters:  02/27/18 166 lb (75.3 kg)  12/27/17 165 lb 6.4 oz (75 kg)  04/18/16 184 lb 1.9 oz (83.5 kg)      Studies/Labs Reviewed:   EKG:  EKG is not ordered today.   Recent Labs: 02/14/2018: ALT 17; BUN 18; Creatinine, Ser 0.77; Hemoglobin 13.6; Platelets 259; Potassium 4.2; Sodium 140   Lipid Panel    Component Value Date/Time   CHOL 165 02/14/2018 0827   TRIG 104 02/14/2018 0827   HDL 49 02/14/2018 0827   CHOLHDL 3.4 02/14/2018 0827   CHOLHDL 7.1 10/04/2011 0545   VLDL 74 (H) 10/04/2011 0545   LDLCALC 95 02/14/2018 0827    Additional studies/ records that were reviewed today include:  Coronary CT 6/22/2019FINDINGS: Too much artifact associated with study, unable to do FFR.   Dalton Mclean     Electronically Signed   By: Marca Anconaalton  Mclean M.D.   On: 02/23/2018 16:45   IMPRESSION: 1. Coronary artery calcium score 463 Agatston units. This places the patient in the 95th percentile for  age and gender, suggesting high risk for future cardiac events.   2. Possible moderate distal RCA stenosis, moderate mid LAD stenosis, moderate D1 stenosis, and moderate OM1 stenosis.   Will send for CT FFR.   Marca AnconaDalton Mclean  2D echo 01/03/2018------------------------------------------------------------------- Study Conclusions   - Left ventricle: The cavity size was normal. Wall thickness was   normal. Systolic function was normal. The estimated ejection   fraction was in the range of 55% to 60%. Wall motion was normal;   there were no regional wall motion abnormalities. Left   ventricular diastolic function parameters were normal. - Aortic valve: Trileaflet; mildly calcified leaflets. Sclerosis   without stenosis. Mean gradient (S): 9 mm Hg. - Mitral valve: There was trivial regurgitation. - Left atrium: The atrium was mildly dilated. - Right ventricle: The cavity size was normal. Systolic function   was normal. - Tricuspid valve: Peak RV-RA gradient (S): 30 mm Hg. - Pulmonary arteries: PA peak pressure: 33 mm Hg (S). - Inferior vena cava: The vessel was normal in size. The   respirophasic diameter changes were in the normal range (>= 50%),   consistent with normal central venous pressure.   Impressions:   - Normal LV size with EF 55-60%. Normal diastolic function. Normal   RV size and systolic function. Aortic valve sclerosis without   significant stenosis.          ASSESSMENT:    1. Chest pain, unspecified type   2. Essential hypertension   3. Mixed hyperlipidemia   4. Tobacco abuse   5. Graves' disease      PLAN:  In order of problems listed above:  Chest pain has been atypical in the past but on speaking with him today he is having chest tightness into his left arm relieved with nitroglycerin. coronary CT with calcium score of 463 possible moderate distal RCA stenosis moderate mid LAD stenosis and moderate diagonal  1 and OM1 stenosis.  FFR was not able to be  done because of the amount of artifact.  2D echo with normal LV function and no wall motion abnormality.  Discussed with Dr. Katrinka Blazing who recommends cardiac catheterization.  Patient is agreeable to proceed. Go to ER for prolonged chest pain. I have reviewed the risks, indications, and alternatives to angioplasty and stenting with the patient. Risks include but are not limited to bleeding, infection, vascular injury, stroke, myocardial infection, arrhythmia, kidney injury, radiation-related injury in the case of prolonged fluoroscopy use, emergency cardiac surgery, and death. The patient understands the risks of serious complication is low (<1%) and patient agrees to proceed.    Essential hypertension blood pressure controlled  Mixed hyperlipidemia LDL was 95 not quite at goal we will add Lipitor 80 mg once daily.  Fasting lipid panel and LFTs in 6 weeks.  Tobacco abuse patient quit smoking since I last saw him.  Graves' disease status post thyroidectomy with residual essential tremor  Carotid bruits will check carotid Dopplers.   Medication Adjustments/Labs and Tests Ordered: Current medicines are reviewed at length with the patient today.  Concerns regarding medicines are outlined above.  Medication changes, Labs and Tests ordered today are listed in the Patient Instructions below. There are no Patient Instructions on file for this visit.   Elson Clan, PA-C  02/27/2018 9:07 AM    [facility] [address] [place], [place], [place]  [address] Phone: [phone]; Fax: [phone]

## 2018-02-26 NOTE — Progress Notes (Signed)
 Cardiology Office Note    Date:  02/27/2018   ID:  [name], DOB 07/07/1961, MRN [id number]  PCP:  Stoneking, Hal, MD  Cardiologist: Henry W Smith III, MD  Chief Complaint  Patient presents with  . Follow-up    History of Present Illness:  [name] is a 56 y.o. male with history of chest pain for over 15 years. Cardiac catheterization by Dr.Helen Preston did not reveal any obstructive disease over 12 years ago.   Last saw Dr. Smith 04/2016 and was having recurrent sharp left chest discomfort that occurred with physical activity and psychological stress. He has also had severe hypertension, HLD, Graves' disease, bipolar 1 disorder. Dr. Smith ordered chest CT with contrast to rule out aneurysm/dissection which was negative with mild plaque at the aortic arch and left subclavian artery origin. Suggestion of minimal plaque in the LAD distribution. And then recommended a pharmacological Myoview which was never done. Also recommended sleep study for snoring.   I saw the patient 12/27/2017 with atypical chest pain after forgetting to take his blood pressure medicines unrelieved with nitroglycerin but eased approximately 6 hours later that day after taking pain medicine.  2D echo 01/02/2018 showed normal LV EF 55 to 60% with aortic valve sclerosis no stenosis.  Coronary CT calcium score was 463 which was 95th percentile for his age and gender suggesting high risk for future cardiac events.  There was possible moderate distal RCA stenosis, moderate mid LAD stenosis, moderate diagonal 1 stenosis and moderate OM1 stenosis.  It was sent for FFR but there was too much artifact to perform FFR.  4 days ago while cleaning pool in the heat he had a sharp pain and tightness-relieved with NTG. Was also waking up with chest tightness/sharp pain down left arm but this is doing better.  Some chest tightness with activity usually relieved with rest and not requiring nitroglycerin.  Quit smoking.  Past  Medical History:  Diagnosis Date  . Anginal pain (HCC) 2000   takes NTG prn  . Arthritis    neck and knee  . Bipolar 1 disorder (HCC)   . Cervical disc syndrome   . Chest pain 10/04/2011  . Depression   . GERD (gastroesophageal reflux disease)   . Grave's disease   . Graves' disease 10/04/2011  . H/O prostatitis   . Hiatal hernia   . HTN (hypertension) 10/05/2011  . Hyperlipidemia   . Hypertension   . Lumbar degenerative disc disease   . Mixed hyperlipidemia 12/27/2017  . Tobacco abuse 12/27/2017    Past Surgical History:  Procedure Laterality Date  . CARDIAC CATHETERIZATION  2000  . CHOLECYSTECTOMY  12/11/2013   DR WILSON   . CHOLECYSTECTOMY N/A 12/11/2013   Procedure: LAPAROSCOPIC CHOLECYSTECTOMY WITH INTRAOPERATIVE CHOLANGIOGRAM;  Surgeon: Eric M Wilson, MD;  Location: [facility] OR;  Service: General;  Laterality: N/A;  . KNEE SURGERY    . testicular torsion surgery    . THYROIDECTOMY      Current Medications: Current Meds  Medication Sig  . amLODipine (NORVASC) 5 MG tablet Take 5 mg by mouth daily.  . buPROPion (WELLBUTRIN XL) 150 MG 24 hr tablet Take 450 mg by mouth daily.  . diazepam (VALIUM) 5 MG tablet Take 5 mg by mouth every 8 (eight) hours as needed for anxiety.   . doxazosin (CARDURA) 4 MG tablet Take 4 mg by mouth 2 (two) times daily.  . doxepin (SINEQUAN) 10 MG capsule Take 10 mg by mouth at bedtime   as needed.  . EPINEPHrine (EPIPEN 2-PAK) 0.3 mg/0.3 mL IJ SOAJ injection Inject 0.3 mLs (0.3 mg total) into the muscle once.  . gabapentin (NEURONTIN) 100 MG capsule Take 100 mg by mouth 2 (two) times daily.  . labetalol (NORMODYNE) 300 MG tablet Take 300 mg by mouth 2 (two) times daily.  . levothyroxine (SYNTHROID, LEVOTHROID) 150 MCG tablet Take 150 mcg by mouth daily before breakfast.  . lisinopril (PRINIVIL,ZESTRIL) 10 MG tablet Take 10 mg by mouth daily.  . nitroGLYCERIN (NITROSTAT) 0.4 MG SL tablet Place 0.4 mg under the tongue every 5 (five) minutes as needed for  chest pain.  . OxyCODONE HCl ER 30 MG T12A Take 1 tablet by mouth 3 (three) times daily as needed (NORMALLY ONCE DAILY).   . QUEtiapine (SEROQUEL) 200 MG tablet Take 200 mg by mouth at bedtime.  . ziprasidone (GEODON) 40 MG capsule Take 40 mg by mouth daily.     Allergies:   Bee venom   Social History   Socioeconomic History  . Marital status: Legally Separated    Spouse name: Not on file  . Number of children: Not on file  . Years of education: Not on file  . Highest education level: Not on file  Occupational History  . Not on file  Social Needs  . Financial resource strain: Not on file  . Food insecurity:    Worry: Not on file    Inability: Not on file  . Transportation needs:    Medical: Not on file    Non-medical: Not on file  Tobacco Use  . Smoking status: Former Smoker    Packs/day: 0.25    Years: 35.00    Pack years: 8.75    Types: Cigarettes    Last attempt to quit: 01/28/2015    Years since quitting: 3.0  . Smokeless tobacco: Never Used  Substance and Sexual Activity  . Alcohol use: No  . Drug use: No  . Sexual activity: Never  Lifestyle  . Physical activity:    Days per week: Not on file    Minutes per session: Not on file  . Stress: Not on file  Relationships  . Social connections:    Talks on phone: Not on file    Gets together: Not on file    Attends religious service: Not on file    Active member of club or organization: Not on file    Attends meetings of clubs or organizations: Not on file    Relationship status: Not on file  Other Topics Concern  . Not on file  Social History Narrative  . Not on file     Family History:  The patient's family history includes Cancer in his other; Hypertension in his father, mother, and other; Valvular heart disease in his father.   ROS:   Please see the history of present illness.    Review of Systems  Constitution: Negative.  HENT: Negative.   Cardiovascular: Positive for chest pain and dyspnea on  exertion.  Respiratory: Negative.   Endocrine: Negative.   Hematologic/Lymphatic: Negative.   Skin: Negative for poor wound healing.  Musculoskeletal: Negative.   Gastrointestinal: Negative.   Genitourinary: Negative.   Neurological: Negative.    All other systems reviewed and are negative.   PHYSICAL EXAM:   VS:  Pulse 72   Ht 5' 9" (1.753 m)   Wt 166 lb (75.3 kg)   SpO2 99%   BMI 24.51 kg/m   Physical Exam  GEN: Well   nourished, well developed, in no acute distress  HEENT: normal  Neck: Bilateral carotid bruits no JVD, or masses Cardiac:RRR; positive S4 and 2/6 systolic murmur at the left sternal border Respiratory:  clear to auscultation bilaterally, normal work of breathing GI: soft, nontender, nondistended, + BS Ext: without cyanosis, clubbing, or edema, Good distal pulses bilaterally MS: no deformity or atrophy  Skin: warm and dry, no rash Neuro:  Alert and Oriented x 3, Strength and sensation are intact Psych: euthymic mood, full affect  Wt Readings from Last 3 Encounters:  02/27/18 166 lb (75.3 kg)  12/27/17 165 lb 6.4 oz (75 kg)  04/18/16 184 lb 1.9 oz (83.5 kg)      Studies/Labs Reviewed:   EKG:  EKG is not ordered today.   Recent Labs: 02/14/2018: ALT 17; BUN 18; Creatinine, Ser 0.77; Hemoglobin 13.6; Platelets 259; Potassium 4.2; Sodium 140   Lipid Panel    Component Value Date/Time   CHOL 165 02/14/2018 0827   TRIG 104 02/14/2018 0827   HDL 49 02/14/2018 0827   CHOLHDL 3.4 02/14/2018 0827   CHOLHDL 7.1 10/04/2011 0545   VLDL 74 (H) 10/04/2011 0545   LDLCALC 95 02/14/2018 0827    Additional studies/ records that were reviewed today include:  Coronary CT 6/22/2019FINDINGS: Too much artifact associated with study, unable to do FFR.   Dalton Mclean     Electronically Signed   By: Dalton  Mclean M.D.   On: 02/23/2018 16:45   IMPRESSION: 1. Coronary artery calcium score 463 Agatston units. This places the patient in the 95th percentile for  age and gender, suggesting high risk for future cardiac events.   2. Possible moderate distal RCA stenosis, moderate mid LAD stenosis, moderate D1 stenosis, and moderate OM1 stenosis.   Will send for CT FFR.   Dalton Mclean  2D echo 01/03/2018------------------------------------------------------------------- Study Conclusions   - Left ventricle: The cavity size was normal. Wall thickness was   normal. Systolic function was normal. The estimated ejection   fraction was in the range of 55% to 60%. Wall motion was normal;   there were no regional wall motion abnormalities. Left   ventricular diastolic function parameters were normal. - Aortic valve: Trileaflet; mildly calcified leaflets. Sclerosis   without stenosis. Mean gradient (S): 9 mm Hg. - Mitral valve: There was trivial regurgitation. - Left atrium: The atrium was mildly dilated. - Right ventricle: The cavity size was normal. Systolic function   was normal. - Tricuspid valve: Peak RV-RA gradient (S): 30 mm Hg. - Pulmonary arteries: PA peak pressure: 33 mm Hg (S). - Inferior vena cava: The vessel was normal in size. The   respirophasic diameter changes were in the normal range (>= 50%),   consistent with normal central venous pressure.   Impressions:   - Normal LV size with EF 55-60%. Normal diastolic function. Normal   RV size and systolic function. Aortic valve sclerosis without   significant stenosis.          ASSESSMENT:    1. Chest pain, unspecified type   2. Essential hypertension   3. Mixed hyperlipidemia   4. Tobacco abuse   5. Graves' disease      PLAN:  In order of problems listed above:  Chest pain has been atypical in the past but on speaking with him today he is having chest tightness into his left arm relieved with nitroglycerin. coronary CT with calcium score of 463 possible moderate distal RCA stenosis moderate mid LAD stenosis and moderate diagonal   1 and OM1 stenosis.  FFR was not able to be  done because of the amount of artifact.  2D echo with normal LV function and no wall motion abnormality.  Discussed with Dr. Smith who recommends cardiac catheterization.  Patient is agreeable to proceed. Go to ER for prolonged chest pain. I have reviewed the risks, indications, and alternatives to angioplasty and stenting with the patient. Risks include but are not limited to bleeding, infection, vascular injury, stroke, myocardial infection, arrhythmia, kidney injury, radiation-related injury in the case of prolonged fluoroscopy use, emergency cardiac surgery, and death. The patient understands the risks of serious complication is low (<1%) and patient agrees to proceed.    Essential hypertension blood pressure controlled  Mixed hyperlipidemia LDL was 95 not quite at goal we will add Lipitor 80 mg once daily.  Fasting lipid panel and LFTs in 6 weeks.  Tobacco abuse patient quit smoking since I last saw him.  Graves' disease status post thyroidectomy with residual essential tremor  Carotid bruits will check carotid Dopplers.   Medication Adjustments/Labs and Tests Ordered: Current medicines are reviewed at length with the patient today.  Concerns regarding medicines are outlined above.  Medication changes, Labs and Tests ordered today are listed in the Patient Instructions below. There are no Patient Instructions on file for this visit.   Signed, Chrystie Hagwood, PA-C  02/27/2018 9:07 AM    [facility] [address], [place], [place]  [address] Phone: [phone]; Fax: [phone]    

## 2018-02-27 ENCOUNTER — Other Ambulatory Visit: Payer: Self-pay | Admitting: *Deleted

## 2018-02-27 ENCOUNTER — Encounter: Payer: Self-pay | Admitting: Physician Assistant

## 2018-02-27 ENCOUNTER — Ambulatory Visit: Payer: Medicare Other | Admitting: Physician Assistant

## 2018-02-27 VITALS — BP 140/86 | HR 72 | Ht 69.0 in | Wt 166.0 lb

## 2018-02-27 DIAGNOSIS — I10 Essential (primary) hypertension: Secondary | ICD-10-CM | POA: Diagnosis not present

## 2018-02-27 DIAGNOSIS — E782 Mixed hyperlipidemia: Secondary | ICD-10-CM | POA: Diagnosis not present

## 2018-02-27 DIAGNOSIS — R0989 Other specified symptoms and signs involving the circulatory and respiratory systems: Secondary | ICD-10-CM | POA: Diagnosis not present

## 2018-02-27 DIAGNOSIS — Z720 Tobacco use: Secondary | ICD-10-CM

## 2018-02-27 DIAGNOSIS — R079 Chest pain, unspecified: Secondary | ICD-10-CM | POA: Diagnosis not present

## 2018-02-27 DIAGNOSIS — E0500 Thyrotoxicosis with diffuse goiter without thyrotoxic crisis or storm: Secondary | ICD-10-CM

## 2018-02-27 LAB — BASIC METABOLIC PANEL
BUN/Creatinine Ratio: 20 (ref 9–20)
BUN: 18 mg/dL (ref 6–24)
CO2: 25 mmol/L (ref 20–29)
Calcium: 9.5 mg/dL (ref 8.7–10.2)
Chloride: 101 mmol/L (ref 96–106)
Creatinine, Ser: 0.88 mg/dL (ref 0.76–1.27)
GFR calc Af Amer: 112 mL/min/{1.73_m2} (ref >59)
GFR calc non Af Amer: 97 mL/min/{1.73_m2} (ref >59)
Glucose: 115 mg/dL — ABNORMAL HIGH (ref 65–99)
Potassium: 4.4 mmol/L (ref 3.5–5.2)
Sodium: 139 mmol/L (ref 134–144)

## 2018-02-27 LAB — CBC
Hematocrit: 41.6 % (ref 37.5–51.0)
Hemoglobin: 14.2 g/dL (ref 13.0–17.7)
MCH: 30.3 pg (ref 26.6–33.0)
MCHC: 34.1 g/dL (ref 31.5–35.7)
MCV: 89 fL (ref 79–97)
Platelets: 278 10*3/uL (ref 150–450)
RBC: 4.69 x10E6/uL (ref 4.14–5.80)
RDW: 12.8 % (ref 12.3–15.4)
WBC: 10.3 10*3/uL (ref 3.4–10.8)

## 2018-02-27 MED ORDER — ATORVASTATIN CALCIUM 80 MG PO TABS: 80.0000 mg | ORAL_TABLET | Freq: Every day | ORAL | 11 refills | Status: DC

## 2018-02-27 NOTE — Telephone Encounter (Signed)
Patient called and stated that he was seen in the office today and was instructed to start atorvastatin 80 mg but he would like to know where the rx was sent as his pharmacy states that they did not receive the order. I do not see where the order was placed but per todays avs:   Signed, Jacolyn ReedyMichele Lenze, PA-C  02/27/2018 9:07 AM    [facility] [address], [place], [place]  [address] Phone: [phone]; Fax: [phone]       Patient Instructions by Vernard Gamblesole, Tyreka S, CMA at 02/27/2018 9:00 AM   Author: Vernard Gamblesole, Tyreka S, CMA Author Type: Certified Medical Assistant Filed: 02/27/2018 9:43 AM  Note Status: Addendum Cosign: Cosign Not Required Encounter Date: 02/27/2018  Editor: Vernard Gamblesole, Tyreka S, CMA (Certified Medical Assistant)  Prior Versions: 1. Vernard Gamblesole, Tyreka S, CMA (Certified Engineer, siteMedical Assistant) at 02/27/2018 9:43 AM - Addendum   2. Vernard Gamblesole, Tyreka S, CMA (Certified Engineer, siteMedical Assistant) at 02/27/2018 9:42 AM - Addendum   3. Vernard Gamblesole, Tyreka S, CMA (Certified Engineer, siteMedical Assistant) at 02/27/2018 9:31 AM - Signed    Medication Instructions: Your physician has recommended you make the following change in your medication:  START: Atorvastatin (Lipitor) 80 mg daily        I apologized to the patient for this and made him aware that I would send it in for #30 as requested to pharmacy of patients choice.

## 2018-02-27 NOTE — Patient Instructions (Addendum)
Medication Instructions: Your physician has recommended you make the following change in your medication:  START: Atorvastatin (Lipitor) 80 mg daily    Labwork: TODAY: BMET, CBC    Your physician recommends that you return for a FASTING lipid profile & LFT'S in 6 weeks   Procedures/Testing:   [facility] [address] [address], [address] [place] [address] Dept: [phone] Loc: [phone]  Rosealee AlbeeGrant M Downum  02/27/2018  You are scheduled for a Cardiac Catheterization on Tuesday, July 9 with Dr. Verdis PrimeHenry Smith.  1. Please arrive at the [facility]: [address] [place], [place] [address] at 5:30 AM (two hours before your procedure to ensure your preparation). Free valet parking service is available.   Special note: Every effort is made to have your procedure done on time. Please understand that emergencies sometimes delay scheduled procedures.  2. Diet: Do not eat or drink anything after midnight prior to your procedure except sips of water to take medications.  3. Labs: You will need to have blood drawn on Wednesday, June [facility] [address], [place]  Open: 7:30am - 5pm    Phone: [phone]. You do not need to be fasting.  4. Medication instructions in preparation for your procedure:     On the morning of your procedure, take your Aspirin and any morning medicines NOT listed above.  You may use sips of water.  5. Plan for one night stay--bring personal belongings. 6. Bring a current list of your medications and current insurance cards. 7. You MUST have a responsible person to drive you home. 8. Someone MUST be with you the first 24 hours after you arrive home or your discharge will be delayed. 9. Please wear clothes that are easy to get on and off and wear slip-on shoes.  Thank you for allowing us to care  for you!   -- [facility]   Your physician has requested that you have a carotid duplex. This test is an ultrasound of the carotid arteries in your neck. It looks at blood flow through these arteries that supply the brain with blood. Allow one hour for this exam. There are no restrictions or special instructions.     Follow-Up: Your physician recommends that you schedule a follow-up appointment ON 03/26/18 AT 1100 AM   Any Additional Special Instructions Will Be Listed Below (If Applicable).     If you need a refill on your cardiac medications before your next appointment, please call your pharmacy.

## 2018-03-04 ENCOUNTER — Other Ambulatory Visit: Payer: Self-pay

## 2018-03-04 MED ORDER — ATORVASTATIN CALCIUM 80 MG PO TABS: 80.0000 mg | ORAL_TABLET | Freq: Every day | ORAL | 1 refills | Status: DC

## 2018-03-06 ENCOUNTER — Ambulatory Visit
Admission: RE | Admit: 2018-03-06 | Discharge: 2018-03-06 | Disposition: A | Discharge status: Home / Self Care | Payer: Medicare Other | Source: Ambulatory Visit | Attending: Geriatric Medicine | Admitting: Geriatric Medicine

## 2018-03-06 ENCOUNTER — Other Ambulatory Visit: Payer: Self-pay | Admitting: Geriatric Medicine

## 2018-03-06 DIAGNOSIS — M542 Cervicalgia: Secondary | ICD-10-CM

## 2018-03-11 ENCOUNTER — Encounter: Payer: Self-pay | Admitting: Physician Assistant

## 2018-03-11 ENCOUNTER — Telehealth: Payer: Self-pay | Admitting: *Deleted

## 2018-03-11 NOTE — Telephone Encounter (Signed)
Pt contacted [facility]catheterization [facility] for: Tuesday March 12, 2018 7:30 AM Verified arrival [facility] and place[facility] at: 5:30 AM  No solid food after midnight prior to cath, clear liquids until 5 AM day of procedure. Verified allergies in Epic Verified no diabetes medications.  AM meds can be  taken pre-cath with sip of water including: ASA 81 mg  Confirmed patient has responsible person to drive home post procedure and for 24 hours after you arrive home: yes

## 2018-03-12 ENCOUNTER — Ambulatory Visit (HOSPITAL_COMMUNITY)
Admission: RE | Admit: 2018-03-12 | Discharge: 2018-03-12 | Disposition: A | Discharge status: Home / Self Care | Payer: Medicare Other | Source: Ambulatory Visit | Attending: Interventional Cardiology | Admitting: Interventional Cardiology

## 2018-03-12 ENCOUNTER — Ambulatory Visit (HOSPITAL_COMMUNITY)
Admission: RE | Disposition: A | Discharge status: Home / Self Care | Payer: Self-pay | Source: Ambulatory Visit | Attending: Interventional Cardiology

## 2018-03-12 ENCOUNTER — Encounter (HOSPITAL_COMMUNITY): Payer: Self-pay | Admitting: Interventional Cardiology

## 2018-03-12 DIAGNOSIS — G250 Essential tremor: Secondary | ICD-10-CM | POA: Insufficient documentation

## 2018-03-12 DIAGNOSIS — I25119 Atherosclerotic heart disease of native coronary artery with unspecified angina pectoris: Secondary | ICD-10-CM | POA: Insufficient documentation

## 2018-03-12 DIAGNOSIS — Z9889 Other specified postprocedural states: Secondary | ICD-10-CM | POA: Insufficient documentation

## 2018-03-12 DIAGNOSIS — I10 Essential (primary) hypertension: Secondary | ICD-10-CM | POA: Diagnosis not present

## 2018-03-12 DIAGNOSIS — E0500 Thyrotoxicosis with diffuse goiter without thyrotoxic crisis or storm: Secondary | ICD-10-CM | POA: Diagnosis not present

## 2018-03-12 DIAGNOSIS — Z79899 Other long term (current) drug therapy: Secondary | ICD-10-CM | POA: Insufficient documentation

## 2018-03-12 DIAGNOSIS — Z87891 Personal history of nicotine dependence: Secondary | ICD-10-CM | POA: Diagnosis not present

## 2018-03-12 DIAGNOSIS — M1990 Unspecified osteoarthritis, unspecified site: Secondary | ICD-10-CM | POA: Insufficient documentation

## 2018-03-12 DIAGNOSIS — I2510 Atherosclerotic heart disease of native coronary artery without angina pectoris: Secondary | ICD-10-CM

## 2018-03-12 DIAGNOSIS — M5136 Other intervertebral disc degeneration, lumbar region: Secondary | ICD-10-CM | POA: Insufficient documentation

## 2018-03-12 DIAGNOSIS — Z79890 Hormone replacement therapy (postmenopausal): Secondary | ICD-10-CM | POA: Diagnosis not present

## 2018-03-12 DIAGNOSIS — F319 Bipolar disorder, unspecified: Secondary | ICD-10-CM | POA: Diagnosis not present

## 2018-03-12 DIAGNOSIS — Z9049 Acquired absence of other specified parts of digestive tract: Secondary | ICD-10-CM | POA: Insufficient documentation

## 2018-03-12 DIAGNOSIS — E782 Mixed hyperlipidemia: Secondary | ICD-10-CM | POA: Insufficient documentation

## 2018-03-12 DIAGNOSIS — Z8249 Family history of ischemic heart disease and other diseases of the circulatory system: Secondary | ICD-10-CM | POA: Insufficient documentation

## 2018-03-12 DIAGNOSIS — Z91030 Bee allergy status: Secondary | ICD-10-CM | POA: Insufficient documentation

## 2018-03-12 DIAGNOSIS — K219 Gastro-esophageal reflux disease without esophagitis: Secondary | ICD-10-CM | POA: Diagnosis not present

## 2018-03-12 DIAGNOSIS — R0789 Other chest pain: Secondary | ICD-10-CM | POA: Diagnosis present

## 2018-03-12 DIAGNOSIS — Z720 Tobacco use: Secondary | ICD-10-CM | POA: Diagnosis present

## 2018-03-12 DIAGNOSIS — R079 Chest pain, unspecified: Secondary | ICD-10-CM

## 2018-03-12 HISTORY — PX: LEFT HEART CATH AND CORONARY ANGIOGRAPHY: CATH118249

## 2018-03-12 SURGERY — LEFT HEART CATH AND CORONARY ANGIOGRAPHY
Anesthesia: LOCAL

## 2018-03-12 MED ORDER — FENTANYL CITRATE (PF) 100 MCG/2ML IJ SOLN
INTRAMUSCULAR | Status: DC | PRN
  Administered 2018-03-12: 50 ug via INTRAVENOUS

## 2018-03-12 MED ORDER — SODIUM CHLORIDE 0.9% FLUSH: 3.0000 mL | Freq: Two times a day (BID) | INTRAVENOUS | Status: DC

## 2018-03-12 MED ORDER — LIDOCAINE HCL (PF) 1 % IJ SOLN
INTRAMUSCULAR | Status: AC
  Filled 2018-03-12: qty 30

## 2018-03-12 MED ORDER — SODIUM CHLORIDE 0.9% FLUSH: 3.0000 mL | INTRAVENOUS | Status: DC | PRN

## 2018-03-12 MED ORDER — LEVOTHYROXINE SODIUM 175 MCG PO TABS: 175.0000 ug | ORAL_TABLET | Freq: Every day | ORAL | Status: DC

## 2018-03-12 MED ORDER — HEPARIN (PORCINE) IN NACL 1000-0.9 UT/500ML-% IV SOLN
INTRAVENOUS | Status: AC
  Filled 2018-03-12: qty 1000

## 2018-03-12 MED ORDER — MIDAZOLAM HCL 2 MG/2ML IJ SOLN
INTRAMUSCULAR | Status: DC | PRN
  Administered 2018-03-12: 1 mg via INTRAVENOUS

## 2018-03-12 MED ORDER — BUPROPION HCL ER (XL) 300 MG PO TB24: 450.0000 mg | ORAL_TABLET | Freq: Every day | ORAL | Status: DC

## 2018-03-12 MED ORDER — OXYCODONE HCL 5 MG PO TABS: 5.0000 mg | ORAL_TABLET | ORAL | Status: DC | PRN

## 2018-03-12 MED ORDER — LIDOCAINE HCL (PF) 1 % IJ SOLN
INTRAMUSCULAR | Status: DC | PRN
  Administered 2018-03-12: 2 mL

## 2018-03-12 MED ORDER — HEPARIN SODIUM (PORCINE) 1000 UNIT/ML IJ SOLN
INTRAMUSCULAR | Status: DC | PRN
  Administered 2018-03-12: 4000 [IU] via INTRAVENOUS

## 2018-03-12 MED ORDER — ZIPRASIDONE HCL 40 MG PO CAPS: 40.0000 mg | ORAL_CAPSULE | Freq: Every day | ORAL | Status: DC

## 2018-03-12 MED ORDER — ONDANSETRON HCL 4 MG/2ML IJ SOLN: 4.0000 mg | Freq: Four times a day (QID) | INTRAMUSCULAR | Status: DC | PRN

## 2018-03-12 MED ORDER — SODIUM CHLORIDE 0.9 % IV SOLN: 250.0000 mL | INTRAVENOUS | Status: DC | PRN

## 2018-03-12 MED ORDER — DOXAZOSIN MESYLATE 4 MG PO TABS: 4.0000 mg | ORAL_TABLET | Freq: Two times a day (BID) | ORAL | Status: DC

## 2018-03-12 MED ORDER — ATORVASTATIN CALCIUM 80 MG PO TABS: 80.0000 mg | ORAL_TABLET | Freq: Every day | ORAL | Status: DC

## 2018-03-12 MED ORDER — SODIUM CHLORIDE 0.9 % WEIGHT BASED INFUSION
3.0000 mL/kg/h | INTRAVENOUS | Status: AC
  Administered 2018-03-12: 3 mL/kg/h via INTRAVENOUS

## 2018-03-12 MED ORDER — FENTANYL CITRATE (PF) 100 MCG/2ML IJ SOLN
INTRAMUSCULAR | Status: AC
  Filled 2018-03-12: qty 2

## 2018-03-12 MED ORDER — ASPIRIN 81 MG PO CHEW: 81.0000 mg | CHEWABLE_TABLET | ORAL | Status: DC

## 2018-03-12 MED ORDER — DOXEPIN HCL 10 MG PO CAPS: 20.0000 mg | ORAL_CAPSULE | Freq: Every day | ORAL | Status: DC

## 2018-03-12 MED ORDER — LABETALOL HCL 300 MG PO TABS: 300.0000 mg | ORAL_TABLET | Freq: Two times a day (BID) | ORAL | Status: DC

## 2018-03-12 MED ORDER — IOPAMIDOL (ISOVUE-370) INJECTION 76%
INTRAVENOUS | Status: DC | PRN
  Administered 2018-03-12: 60 mL via INTRA_ARTERIAL

## 2018-03-12 MED ORDER — MIDAZOLAM HCL 2 MG/2ML IJ SOLN
INTRAMUSCULAR | Status: AC
  Filled 2018-03-12: qty 2

## 2018-03-12 MED ORDER — PANTOPRAZOLE SODIUM 40 MG PO TBEC: 40.0000 mg | DELAYED_RELEASE_TABLET | Freq: Every day | ORAL | Status: DC

## 2018-03-12 MED ORDER — EPINEPHRINE 0.3 MG/0.3ML IJ SOAJ: 0.3000 mg | Freq: Once | INTRAMUSCULAR | Status: DC

## 2018-03-12 MED ORDER — HEPARIN (PORCINE) IN NACL 2-0.9 UNITS/ML
INTRAMUSCULAR | Status: AC | PRN
  Administered 2018-03-12 (×2): 500 mL via INTRA_ARTERIAL

## 2018-03-12 MED ORDER — VERAPAMIL HCL 2.5 MG/ML IV SOLN
INTRAVENOUS | Status: AC
  Filled 2018-03-12: qty 2

## 2018-03-12 MED ORDER — ASPIRIN 81 MG PO CHEW: 81.0000 mg | CHEWABLE_TABLET | Freq: Every day | ORAL | Status: DC

## 2018-03-12 MED ORDER — LISINOPRIL 10 MG PO TABS: 10.0000 mg | ORAL_TABLET | Freq: Every day | ORAL | Status: DC

## 2018-03-12 MED ORDER — SODIUM CHLORIDE 0.9 % IV SOLN: INTRAVENOUS | Status: DC

## 2018-03-12 MED ORDER — ACETAMINOPHEN 325 MG PO TABS: 650.0000 mg | ORAL_TABLET | ORAL | Status: DC | PRN

## 2018-03-12 MED ORDER — ATORVASTATIN CALCIUM 80 MG PO TABS: 80.0000 mg | ORAL_TABLET | Freq: Every day | ORAL | 3 refills | Status: DC

## 2018-03-12 MED ORDER — IOPAMIDOL (ISOVUE-370) INJECTION 76%
INTRAVENOUS | Status: AC
  Filled 2018-03-12: qty 100

## 2018-03-12 MED ORDER — AMLODIPINE BESYLATE 5 MG PO TABS: 10.0000 mg | ORAL_TABLET | Freq: Every day | ORAL | Status: DC

## 2018-03-12 MED ORDER — NITROGLYCERIN 0.4 MG SL SUBL: 0.4000 mg | SUBLINGUAL_TABLET | SUBLINGUAL | Status: DC | PRN

## 2018-03-12 MED ORDER — DIAZEPAM 5 MG PO TABS: 10.0000 mg | ORAL_TABLET | Freq: Every evening | ORAL | Status: DC

## 2018-03-12 MED ORDER — ASPIRIN EC 81 MG PO TBEC: 81.0000 mg | DELAYED_RELEASE_TABLET | Freq: Every day | ORAL | Status: DC

## 2018-03-12 MED ORDER — SODIUM CHLORIDE 0.9 % WEIGHT BASED INFUSION: 1.0000 mL/kg/h | INTRAVENOUS | Status: DC

## 2018-03-12 MED ORDER — VERAPAMIL HCL 2.5 MG/ML IV SOLN
INTRAVENOUS | Status: DC | PRN
  Administered 2018-03-12: 10 mL via INTRA_ARTERIAL

## 2018-03-12 MED ORDER — HEPARIN SODIUM (PORCINE) 1000 UNIT/ML IJ SOLN
INTRAMUSCULAR | Status: AC
  Filled 2018-03-12: qty 1

## 2018-03-12 SURGICAL SUPPLY — 12 items
CATH INFINITI 5 FR JL3.5 (CATHETERS) ×1 IMPLANT
CATH INFINITI JR4 5F (CATHETERS) ×1 IMPLANT
COVER PRB 48X5XTLSCP FOLD TPE (BAG) IMPLANT
COVER PROBE 5X48 (BAG) ×2
DEVICE RAD COMP TR BAND LRG (VASCULAR PRODUCTS) ×1 IMPLANT
GLIDESHEATH SLEND A-KIT 6F 22G (SHEATH) ×1 IMPLANT
GUIDEWIRE INQWIRE 1.5J.035X260 (WIRE) IMPLANT
INQWIRE 1.5J .035X260CM (WIRE) ×2
KIT HEART LEFT (KITS) ×2 IMPLANT
PACK CARDIAC CATHETERIZATION (CUSTOM PROCEDURE TRAY) ×2 IMPLANT
TRANSDUCER W/STOPCOCK (MISCELLANEOUS) ×2 IMPLANT
TUBING CIL FLEX 10 FLL-RA (TUBING) ×2 IMPLANT

## 2018-03-12 NOTE — Research (Signed)
CADFEM Informed Consent   Subject Name: [name]  Subject met inclusion and exclusion criteria.  The informed consent form, study requirements and expectations were reviewed with the subject and questions and concerns were addressed prior to the signing of the consent form.  The subject verbalized understanding of the trail requirements.  The subject agreed to participate in the CADFEM trial and signed the informed consent.  The informed consent was obtained prior to performance of any protocol-specific procedures for the subject.  A copy of the signed informed consent was given to the subject and a copy was placed in the subject's medical record.  Hedrick,Kailene Steinhart W 03/12/2018, [id number]

## 2018-03-12 NOTE — Interval H&P Note (Signed)
Cath Lab Visit (complete for each Cath Lab visit)  Clinical Evaluation Leading to the Procedure:   ACS: No.  Non-ACS:    Anginal Classification: CCS II  Anti-ischemic medical therapy: Minimal Therapy (1 class of medications)  Non-Invasive Test Results: Intermediate-risk stress test findings: cardiac mortality 1-3%/year  Prior CABG: No previous CABG      History and Physical Interval Note:  03/12/2018 7:18 AM  [name]  has presented today for surgery, with the diagnosis of cp  The various methods of treatment have been discussed with the patient and family. After consideration of risks, benefits and other options for treatment, the patient has consented to  Procedure(s): LEFT HEART CATH AND CORONARY ANGIOGRAPHY (N/A) as a surgical intervention .  The patient's history has been reviewed, patient examined, no change in status, stable for surgery.  I have reviewed the patient's chart and labs.  Questions were answered to the patient's satisfaction.     Lyn RecordsHenry W Bronwyn Belasco III

## 2018-03-12 NOTE — Discharge Instructions (Signed)
**Note -identified via Obfuscation** Radial Site Care °Refer to this sheet in the next few weeks. These instructions provide you with information about caring for yourself after your procedure. Your health care provider may also give you more specific instructions. Your treatment has been planned according to current medical practices, but problems sometimes occur. Call your health care provider if you have any problems or questions after your procedure. °What can I expect after the procedure? °After your procedure, it is typical to have the following: °· Bruising at the radial site that usually fades within 1-2 weeks. °· Blood collecting in the tissue (hematoma) that may be painful to the touch. It should usually decrease in size and tenderness within 1-2 weeks. ° °Follow these instructions at home: °· Take medicines only as directed by your health care provider. °· You may shower 24-48 hours after the procedure or as directed by your health care provider. Remove the bandage (dressing) and gently wash the site with plain soap and water. Pat the area dry with a clean towel. Do not rub the site, because this may cause bleeding. °· Do not take baths, swim, or use a hot tub until your health care provider approves. °· Check your insertion site every day for redness, swelling, or drainage. °· Do not apply powder or lotion to the site. °· Do not flex or bend the affected arm for 24 hours or as directed by your health care provider. °· Do not push or pull heavy objects with the affected arm for 24 hours or as directed by your health care provider. °· Do not lift over 10 lb (4.5 kg) for 5 days after your procedure or as directed by your health care provider. °· Ask your health care provider when it is okay to: °? Return to work or school. °? Resume usual physical activities or sports. °? Resume sexual activity. °· Do not drive home if you are discharged the same day as the procedure. Have someone else drive you. °· You may drive 24 hours after the procedure  unless otherwise instructed by your health care provider. °· Do not operate machinery or power tools for 24 hours after the procedure. °· If your procedure was done as an outpatient procedure, which means that you went home the same day as your procedure, a responsible adult should be with you for the first 24 hours after you arrive home. °· Keep all follow-up visits as directed by your health care provider. This is important. °Contact a health care provider if: °· You have a fever. °· You have chills. °· You have increased bleeding from the radial site. Hold pressure on the site. °Get help right away if: °· You have unusual pain at the radial site. °· You have redness, warmth, or swelling at the radial site. °· You have drainage (other than a small amount of blood on the dressing) from the radial site. °· The radial site is bleeding, and the bleeding does not stop after 30 minutes of holding steady pressure on the site. °· Your arm or hand becomes pale, cool, tingly, or numb. °This information is not intended to replace advice given to you by your health care provider. Make sure you discuss any questions you have with your health care provider. °Document Released: 09/23/2010 Document Revised: 01/27/2016 Document Reviewed: 03/09/2014 °Elsevier Interactive Patient Education © 2018 [employer]vier Inc. ° °

## 2018-03-12 NOTE — Op Note (Signed)
   50 to 70% distal circumflex.  50 to 70% angulated first obtuse marginal.  50% first diagonal.  Luminal irregularities and proximal circumflex, entire LAD, proximal mid and distal RCA.  Normal LV function and hemodynamics  No high-grade obstructive disease

## 2018-03-13 MED FILL — Heparin Sod (Porcine)-NaCl IV Soln 1000 Unit/500ML-0.9%: INTRAVENOUS | Qty: 500 | Status: AC

## 2018-03-19 ENCOUNTER — Ambulatory Visit (HOSPITAL_COMMUNITY)
Admission: RE | Admit: 2018-03-19 | Discharge: 2018-03-19 | Disposition: A | Discharge status: Home / Self Care | Payer: Medicare Other | Source: Ambulatory Visit | Attending: Cardiovascular Disease | Admitting: Cardiovascular Disease

## 2018-03-19 DIAGNOSIS — R0989 Other specified symptoms and signs involving the circulatory and respiratory systems: Secondary | ICD-10-CM | POA: Insufficient documentation

## 2018-03-20 ENCOUNTER — Other Ambulatory Visit: Payer: Self-pay | Admitting: Interventional Cardiology

## 2018-03-20 ENCOUNTER — Encounter: Payer: Self-pay | Admitting: Interventional Cardiology

## 2018-03-20 DIAGNOSIS — I771 Stricture of artery: Secondary | ICD-10-CM | POA: Insufficient documentation

## 2018-03-25 DIAGNOSIS — I2510 Atherosclerotic heart disease of native coronary artery without angina pectoris: Secondary | ICD-10-CM | POA: Insufficient documentation

## 2018-03-25 NOTE — Progress Notes (Signed)
Cardiology Office Note    Date:  03/26/2018   ID:  [name], DOB 12/23/1960, MRN [id number]  PCP:  Merlene Laughter, MD  Cardiologist: Lesleigh Noe, MD  No chief complaint on file.   History of Present Illness:  [name] is a 57 y.o. male with history of chest pain for over 15 years. Cardiac catheterization by Dr.Helen Fraser Din did not reveal any obstructive disease over 12 years ago.    Last saw Dr. Katrinka Blazing 04/2016 and was having recurrent sharp left chest discomfort that occurred with physical activity and psychological stress. He has also had severe hypertension, HLD, Graves' disease, bipolar 1 disorder. Dr. Katrinka Blazing ordered chest CT with contrast to rule out aneurysm/dissection which was negative with mild plaque at the aortic arch and left subclavian artery origin. Suggestion of minimal plaque in the LAD distribution. And then recommended a pharmacological Myoview which was never done. Also recommended sleep study for snoring.    I saw the patient 12/27/2017 with chest pain after forgetting to take his blood pressure medicines.  2D echo 01/2018 normal LVEF with aortic valve sclerosis no stenosis.  Coronary CT calcium score was 463 which was 95th percentile for age and gender suggesting high risk for future cardiac events.  There was possible moderate distal RCA stenosis and moderate mid LAD stenosis and moderate diagonal 1 stenosis and moderate OM1 stenosis.  There was too much artifact for FFR to be performed.  Dr. Katrinka Blazing recommended cardiac catheterization which was done 03/12/2018 and showed three-vessel mild to moderate nonobstructive coronary disease with 50 to 60% distal circumflex, 50 to 60% diagonal 1, 20 to 30% proximal to distal RCA and luminal irregularities in the proximal to distal LAD.  Ejection fraction 65%.  Aggressive risk factor modification recommended perhaps pushing LDL to less than 50.Marland Kitchen  Patient comes in today for follow up. No more angina. Not smoking.  Running  to exercise and watch his diet closely.  Mentions that every time he turns his head to the right he gets dizzy and has had syncope.  Last time he passed out was over a year ago but he says he cannot turn his head to the right or he will pass out.  Carotid Dopplers showed 1 to 39% carotid stenosis bilaterally but right subclavian artery flow was disturbed.  Past Medical History:  Diagnosis Date  . Anginal pain (HCC) 2000   takes NTG prn  . Arthritis    neck and knee  . Bipolar 1 disorder (HCC)   . Cervical disc syndrome   . Chest pain 10/04/2011  . Depression   . GERD (gastroesophageal reflux disease)   . Grave's disease   . Graves' disease 10/04/2011  . H/O prostatitis   . Hiatal hernia   . HTN (hypertension) 10/05/2011  . Hyperlipidemia   . Hypertension   . Lumbar degenerative disc disease   . Mixed hyperlipidemia 12/27/2017  . Tobacco abuse 12/27/2017    Past Surgical History:  Procedure Laterality Date  . CARDIAC CATHETERIZATION  2000  . CHOLECYSTECTOMY  12/11/2013   DR Andrey Campanile   . CHOLECYSTECTOMY N/A 12/11/2013   Procedure: LAPAROSCOPIC CHOLECYSTECTOMY WITH INTRAOPERATIVE CHOLANGIOGRAM;  Surgeon: Atilano Ina, MD;  [facility] OR;  Service: General;  Laterality: N/A;  . KNEE SURGERY    . LEFT HEART CATH AND CORONARY ANGIOGRAPHY N/A 03/12/2018   Procedure: LEFT HEART CATH AND CORONARY ANGIOGRAPHY;  Surgeon: Lyn Records, MD;  Location: [facility] INVASIVE CV LAB;  Service:  Cardiovascular;  Laterality: N/A;  . testicular torsion surgery    . THYROIDECTOMY      Current Medications: Current Meds  Medication Sig  . amLODipine (NORVASC) 10 MG tablet Take 10 mg by mouth daily.   Marland Kitchen aspirin EC 81 MG tablet Take 81 mg by mouth daily.  Marland Kitchen atorvastatin (LIPITOR) 80 MG tablet Take 1 tablet (80 mg total) by mouth daily.  Marland Kitchen buPROPion (WELLBUTRIN XL) 150 MG 24 hr tablet Take 450 mg by mouth daily.  . diazepam (VALIUM) 5 MG tablet Take 10 mg by mouth every evening.   Marland Kitchen doxazosin (CARDURA) 4 MG  tablet Take 4 mg by mouth 2 (two) times daily.  Marland Kitchen doxepin (SINEQUAN) 10 MG capsule Take 20 mg by mouth at bedtime.   Marland Kitchen EPINEPHrine (EPIPEN 2-PAK) 0.3 mg/0.3 mL IJ SOAJ injection Inject 0.3 mLs (0.3 mg total) into the muscle once.  . labetalol (NORMODYNE) 300 MG tablet Take 300 mg by mouth 2 (two) times daily.  Marland Kitchen levothyroxine (SYNTHROID, LEVOTHROID) 175 MCG tablet Take 175 mcg by mouth daily before breakfast.   . lisinopril (PRINIVIL,ZESTRIL) 10 MG tablet Take 10 mg by mouth daily.  . nitroGLYCERIN (NITROSTAT) 0.4 MG SL tablet Place 0.4 mg under the tongue every 5 (five) minutes as needed for chest pain.  Marland Kitchen omeprazole (PRILOSEC) 40 MG capsule Take 40 mg by mouth daily.  Marland Kitchen oxycodone (ROXICODONE) 30 MG immediate release tablet Take 30 mg by mouth every 8 (eight) hours as needed for pain.  . ziprasidone (GEODON) 40 MG capsule Take 40 mg by mouth daily.     Allergies:   Bee venom   Social History   Socioeconomic History  . Marital status: Legally Separated    Spouse name: Not on file  . Number of children: Not on file  . Years of education: Not on file  . Highest education level: Not on file  Occupational History  . Not on file  Social Needs  . Financial resource strain: Not on file  . Food insecurity:    Worry: Not on file    Inability: Not on file  . Transportation needs:    Medical: Not on file    Non-medical: Not on file  Tobacco Use  . Smoking status: Former Smoker    Packs/day: 0.25    Years: 35.00    Pack years: 8.75    Types: Cigarettes    Last attempt to quit: 01/28/2015    Years since quitting: 3.1  . Smokeless tobacco: Never Used  Substance and Sexual Activity  . Alcohol use: No  . Drug use: No  . Sexual activity: Never  Lifestyle  . Physical activity:    Days per week: Not on file    Minutes per session: Not on file  . Stress: Not on file  Relationships  . Social connections:    Talks on phone: Not on file    Gets together: Not on file    Attends religious  service: Not on file    Active member of club or organization: Not on file    Attends meetings of clubs or organizations: Not on file    Relationship status: Not on file  Other Topics Concern  . Not on file  Social History Narrative  . Not on file     Family History:  The patient's family history includes Cancer in his other; Hypertension in his father, mother, and other; Valvular heart disease in his father.   ROS:   Please see  the history of present illness.    Review of Systems  Constitution: Negative.  HENT: Negative.   Cardiovascular: Positive for syncope.  Respiratory: Negative.   Endocrine: Negative.   Hematologic/Lymphatic: Negative.   Musculoskeletal: Negative.   Gastrointestinal: Negative.   Genitourinary: Negative.   Neurological: Positive for tremors.   All other systems reviewed and are negative.   PHYSICAL EXAM:   VS:  BP 136/76   Pulse 69   Ht 5\' 9"  (1.753 m)   Wt 171 lb 12 oz (77.9 kg)   SpO2 97%   BMI 25.36 kg/m   Physical Exam  GEN: Well nourished, well developed, in no acute distress  Neck: Bilateral carotid bruits no JVD, or masses Cardiac:RRR; 2/6 systolic murmur at the left sternal border Respiratory:  clear to auscultation bilaterally, normal work of breathing GI: soft, nontender, nondistended, + BS Ext: Right arm at cath site without hematoma or hemorrhage, good radial brachial pulses, lower extremities without cyanosis, clubbing, or edema, Good distal pulses bilaterally Neuro:  Alert and Oriented x 3 Psych: euthymic mood, full affect  Wt Readings from Last 3 Encounters:  03/26/18 171 lb 12 oz (77.9 kg)  03/12/18 165 lb (74.8 kg)  02/27/18 166 lb (75.3 kg)      Studies/Labs Reviewed:   EKG:  EKG is not ordered today.    Recent Labs: 02/14/2018: ALT 17 02/27/2018: BUN 18; Creatinine, Ser 0.88; Hemoglobin 14.2; Platelets 278; Potassium 4.4; Sodium 139   Lipid Panel    Component Value Date/Time   CHOL 165 02/14/2018 0827   TRIG 104  02/14/2018 0827   HDL 49 02/14/2018 0827   CHOLHDL 3.4 02/14/2018 0827   CHOLHDL 7.1 10/04/2011 0545   VLDL 74 (H) 10/04/2011 0545   LDLCALC 95 02/14/2018 0827    Additional studies/ records that were reviewed today include:   Coronary CT 6/22/2019FINDINGS: Too much artifact associated with study, unable to do FFR.   [name]     Electronically Signed   By: Marca Ancona M.D.   On: 02/23/2018 16:45   IMPRESSION: 1. Coronary artery calcium score 463 Agatston units. This places the patient in the 95th percentile for age and gender, suggesting high risk for future cardiac events.   2. Possible moderate distal RCA stenosis, moderate mid LAD stenosis, moderate D1 stenosis, and moderate OM1 stenosis.   Will send for CT FFR.   Marca Ancona   2D echo 01/03/2018------------------------------------------------------------------- Study Conclusions   - Left ventricle: The cavity size was normal. Wall thickness was   normal. Systolic function was normal. The estimated ejection   fraction was in the range of 55% to 60%. Wall motion was normal;   there were no regional wall motion abnormalities. Left   ventricular diastolic function parameters were normal. - Aortic valve: Trileaflet; mildly calcified leaflets. Sclerosis   without stenosis. Mean gradient (S): 9 mm Hg. - Mitral valve: There was trivial regurgitation. - Left atrium: The atrium was mildly dilated. - Right ventricle: The cavity size was normal. Systolic function   was normal. - Tricuspid valve: Peak RV-RA gradient (S): 30 mm Hg. - Pulmonary arteries: PA peak pressure: 33 mm Hg (S). - Inferior vena cava: The vessel was normal in size. The   respirophasic diameter changes were in the normal range (>= 50%),   consistent with normal central venous pressure.   Impressions:   - Normal LV size with EF 55-60%. Normal diastolic function. Normal   RV size and systolic function. Aortic valve sclerosis without  significant stenosis.       Cardiac catheterization 7/9/2019Three-vessel mild to moderate nonobstructive coronary disease with 50 to 60% distal circumflex, 50 to 60% proximal first diagonal, 20 to 30% proximal to distal RCA, and luminal irregularities in the proximal to distal LAD.  Normal left ventricular size and function.  Estimated ejection fraction 65%.  Acticin coronary calcium score greater than 400, documented by previous coronary CTA   RECOMMENDATIONS:    Aggressive risk factor modification: LDL less than 70 and perhaps pushing to 50, < blood pressure 130/80 mmHg, monitor hemoglobin A1c and maintain less than 7, aspirin 81 mg/day, and 150 minutes of moderate aerobic activity per week    Carotid Dopplers 03/19/2018 1 to 39% bilateral stenosis       ASSESSMENT:    1. Coronary artery disease involving native coronary artery of native heart without angina pectoris   2. Essential hypertension   3. Mixed hyperlipidemia   4. Bilateral carotid bruits   5. Syncope, unspecified syncope type      PLAN:  In order of problems listed above:  CAD no further angina.  With calcium score on CT greater than 400 cardiac catheterization with disease listed above.  Aggressive risk factor modification recommended with goal LDL less than 70 but possibly below 50 per Dr. Katrinka BlazingSmith  Essential hypertension blood pressure controlled  Mixed hyperlipidemia LDL 95 triglycerides 104-02/14/18 on Lipitor 80 mg daily started earlier this month.  Will repeat lipids and LFTs in 6 weeks.  Bilateral carotid bruits carotid Dopplers 1 to 39% bilateral carotid stenosis  Tobacco abuse patient quit smoking after last office visit.  Syncope last episode approximately 1 year ago but says every time he turns his head to the right he gets dizzy and will pass out.  Carotid Dopplers did show right subclavian artery flow was disturbed normal flow on the left.  Discussed with Dr. Katrinka BlazingSmith who recommends CT angio to look at  the aortic arch and subclavian closer.  Follow-up with Dr. Katrinka BlazingSmith in 2 months.  Medication Adjustments/Labs and Tests Ordered: Current medicines are reviewed at length with the patient today.  Concerns regarding medicines are outlined above.  Medication changes, Labs and Tests ordered today are listed in the Patient Instructions below. Patient Instructions  Medication Instructions:  Your physician recommends that you continue on your current medications as directed. Please refer to the Current Medication list given to you today.   Labwork: Labs to be done in August: LFT, Fasting Lipids  Testing/Procedures:  Non-Cardiac CT Angiography (CTA), is a special type of CT scan that uses a computer to produce multi-dimensional views of major blood vessels throughout the body. In CT angiography, a contrast material is injected through an IV to help visualize the blood vessels  Follow-Up: Your physician wants you to follow-up in: 2 MONTHS WITH DR. Katrinka BlazingSMITH.  Any Other Special Instructions Will Be Listed Below (If Applicable).     If you need a refill on your cardiac medications before your next appointment, please call your pharmacy.      Elson ClanSigned, Michele Lenze, PA-C  03/26/2018 11:27 AM    [facility] [address], [place], [place]  [address] Phone: [phone]) [phone]; Fax: [phone]

## 2018-03-26 ENCOUNTER — Ambulatory Visit: Payer: Medicare Other | Admitting: Physician Assistant

## 2018-03-26 ENCOUNTER — Encounter: Payer: Self-pay | Admitting: Physician Assistant

## 2018-03-26 VITALS — BP 136/76 | HR 69 | Ht 69.0 in | Wt 171.8 lb

## 2018-03-26 DIAGNOSIS — R0989 Other specified symptoms and signs involving the circulatory and respiratory systems: Secondary | ICD-10-CM

## 2018-03-26 DIAGNOSIS — I2510 Atherosclerotic heart disease of native coronary artery without angina pectoris: Secondary | ICD-10-CM | POA: Diagnosis not present

## 2018-03-26 DIAGNOSIS — I10 Essential (primary) hypertension: Secondary | ICD-10-CM

## 2018-03-26 DIAGNOSIS — E782 Mixed hyperlipidemia: Secondary | ICD-10-CM | POA: Diagnosis not present

## 2018-03-26 DIAGNOSIS — R55 Syncope and collapse: Secondary | ICD-10-CM

## 2018-03-26 NOTE — Patient Instructions (Signed)
Medication Instructions:  Your physician recommends that you continue on your current medications as directed. Please refer to the Current Medication list given to you today.   Labwork: Labs to be done in August: LFT, Fasting Lipids  Testing/Procedures:  Non-Cardiac CT Angiography (CTA), is a special type of CT scan that uses a computer to produce multi-dimensional views of major blood vessels throughout the body. In CT angiography, a contrast material is injected through an IV to help visualize the blood vessels  Follow-Up: Your physician wants you to follow-up in: 2 MONTHS WITH DR. Katrinka BlazingSMITH.  Any Other Special Instructions Will Be Listed Below (If Applicable).     If you need a refill on your cardiac medications before your next appointment, please call your pharmacy.

## 2018-04-04 ENCOUNTER — Ambulatory Visit (INDEPENDENT_AMBULATORY_CARE_PROVIDER_SITE_OTHER)
Admission: RE | Admit: 2018-04-04 | Discharge: 2018-04-04 | Disposition: A | Discharge status: Home / Self Care | Payer: Medicare Other | Source: Ambulatory Visit | Attending: Physician Assistant | Admitting: Physician Assistant

## 2018-04-04 DIAGNOSIS — R55 Syncope and collapse: Secondary | ICD-10-CM | POA: Diagnosis not present

## 2018-04-04 MED ORDER — IOPAMIDOL (ISOVUE-370) INJECTION 76%
80.0000 mL | Freq: Once | INTRAVENOUS | Status: AC | PRN
  Administered 2018-04-04: 80 mL via INTRAVENOUS

## 2018-04-05 ENCOUNTER — Telehealth: Payer: Self-pay | Admitting: *Deleted

## 2018-04-05 DIAGNOSIS — R55 Syncope and collapse: Secondary | ICD-10-CM

## 2018-04-05 NOTE — Telephone Encounter (Signed)
-----   Message from Dyann KiefMichele M Lenze, [place]-C sent at 04/05/2018  8:00 AM EDT ----- Can you let patient know Dr. Katrinka BlazingSmith reviewed his CT scan and wants to refer him to neurologist to see if his symptoms of syncope when he turns his neck correlates with CT findings of moderate to severe blockage in distal vertebral arteries.  Please refer to [facility].

## 2018-04-08 ENCOUNTER — Encounter: Payer: Self-pay | Admitting: Neurology

## 2018-04-08 ENCOUNTER — Other Ambulatory Visit (HOSPITAL_COMMUNITY): Payer: Self-pay | Admitting: Interventional Radiology

## 2018-04-08 ENCOUNTER — Telehealth: Payer: Self-pay | Admitting: Neurology

## 2018-04-08 ENCOUNTER — Ambulatory Visit: Payer: Medicare Other | Admitting: Neurology

## 2018-04-08 VITALS — BP 139/87 | HR 65 | Ht 69.0 in | Wt 173.0 lb

## 2018-04-08 DIAGNOSIS — G450 Vertebro-basilar artery syndrome: Secondary | ICD-10-CM

## 2018-04-08 DIAGNOSIS — I63012 Cerebral infarction due to thrombosis of left vertebral artery: Secondary | ICD-10-CM

## 2018-04-08 DIAGNOSIS — I6503 Occlusion and stenosis of bilateral vertebral arteries: Secondary | ICD-10-CM

## 2018-04-08 DIAGNOSIS — R55 Syncope and collapse: Secondary | ICD-10-CM

## 2018-04-08 NOTE — Progress Notes (Signed)
[facility]    Provider:  Dr Lucia GaskinsAhern Referring Provider: Merlene LaughterStoneking, Hal, MD Primary Care Physician:  Merlene LaughterStoneking, Hal, MD  CC:  Vertebral artery occlusion and syncope  HPI:  [name] is a 57 y.o. male here as a referral from Dr. Pete GlatterStoneking for vertebral artery occlusion. PMHx tobacco abuse, HLD, HTN, bipolar. He has had syncope for years, he has had a "bad neck" and multiple accidents affecting his neck. He has episodes of syncope, sometimes he can "catch himself, if he is extending his neck he can pass out. He feels pins and needles in is head, whole head feels loopy, dizzy, lightheaded, just goes black. If he quits looking up he can stop it. No palpitations, no vertigo, but very lightheaded, +vison changes. If he moves his neck he can avoid it. Loss of consciousness is brief. No confusion afterwards.  He smoked for 35 years. Takes 81 mg lipitor every day and statin. He has stopped smoking. No other focal neurologic deficits, associated symptoms, inciting events or modifiable factors.  Reviewed notes, labs and imaging from outside physicians, which showed:   LDL 95  Personally reviewed images and agree with the following:  1. Positive for moderate to severe stenosis of both distal vertebral arteries in the posterior fossa related to soft and calcified plaque. The vertebrobasilar junction remains patent. There is mild to moderate plaque and stenosis in the proximal basilar artery. 2. No significant subclavian or cervical carotid artery stenosis despite intermittent soft and calcified plaque. Note that there is at least moderate calcified atherosclerosis of both ICA siphons at the skull base. 3.  Aortic Atherosclerosis (ICD10-I70.0). 4. Evidence of degenerative cervical spinal stenosis at C3-C4.   Reviewed report VAS US carotids  Right Carotid: Velocities in the right ICA are consistent with a 1-39% stenosis.        The RICA velocities remain within  normal range and stable        compared to the prior exam.  Left Carotid: Velocities in the left ICA are consistent with a 1-39% stenosis.       The LICA velocities remain within normal range and stable compared       to the prior exam.  Vertebrals: Bilateral vertebral arteries demonstrate antegrade flow. Subclavians: Right subclavian artery flow was disturbed. Normal flow       hemodynamics were seen in the left subclavian artery.   Review of Systems: Patient complains of symptoms per HPI as well as the following symptoms: passing out. Pertinent negatives and positives per HPI. All others negative.   Social History   Socioeconomic History  . Marital status: Legally Separated    Spouse name: Not on file  . Number of children: 1  . Years of education: Not on file  . Highest education level: Some college, no degree  Occupational History  . Not on file  Social Needs  . Financial resource strain: Not on file  . Food insecurity:    Worry: Not on file    Inability: Not on file  . Transportation needs:    Medical: Not on file    Non-medical: Not on file  Tobacco Use  . Smoking status: Former Smoker    Packs/day: 0.25    Years: 35.00    Pack years: 8.75    Types: Cigarettes    Last attempt to quit: 01/28/2015    Years since quitting: 3.1  . Smokeless tobacco: Never Used  Substance and Sexual Activity  . Alcohol use: No  .  Drug use: No  . Sexual activity: Never  Lifestyle  . Physical activity:    Days per week: Not on file    Minutes per session: Not on file  . Stress: Not on file  Relationships  . Social connections:    Talks on phone: Not on file    Gets together: Not on file    Attends religious service: Not on file    Active member of club or organization: Not on file    Attends meetings of clubs or organizations: Not on file    Relationship status: Not on file  . Intimate partner violence:    Fear of current or ex partner: Not on  file    Emotionally abused: Not on file    Physically abused: Not on file    Forced sexual activity: Not on file  Other Topics Concern  . Not on file  Social History Narrative   Lives at home with his parents    Right handed   Caffeine: about 36 oz of soda daily    Family History  Problem Relation Age of Onset  . Hypertension Mother   . Hypertension Father   . Valvular heart disease Father   . Cancer Other   . Hypertension Other     Past Medical History:  Diagnosis Date  . Anginal pain (HCC) 2000   takes NTG prn  . Arthritis    neck and knee  . Barrett's esophagus   . Bipolar 1 disorder (HCC)   . Cervical disc syndrome   . Chest pain 10/04/2011  . Depression   . GERD (gastroesophageal reflux disease)   . Grave's disease   . Graves' disease 10/04/2011  . H/O prostatitis   . Hiatal hernia   . HTN (hypertension) 10/05/2011  . Hyperlipidemia   . Hypertension   . Lumbar degenerative disc disease   . Mixed hyperlipidemia 12/27/2017  . Tobacco abuse 12/27/2017    Past Surgical History:  Procedure Laterality Date  . CARDIAC CATHETERIZATION  2000  . CHOLECYSTECTOMY  12/11/2013   DR Andrey Campanile   . CHOLECYSTECTOMY N/A 12/11/2013   Procedure: LAPAROSCOPIC CHOLECYSTECTOMY WITH INTRAOPERATIVE CHOLANGIOGRAM;  Surgeon: Atilano Ina, MD;  Location: [facility] OR;  Service: General;  Laterality: N/A;  . KNEE SURGERY    . LEFT HEART CATH AND CORONARY ANGIOGRAPHY N/A 03/12/2018   Procedure: LEFT HEART CATH AND CORONARY ANGIOGRAPHY;  Surgeon: Lyn Records, MD;  Location: [facility] INVASIVE CV LAB;  Service: Cardiovascular;  Laterality: N/A;  . testicular torsion surgery    . THYROIDECTOMY      Current Outpatient Medications  Medication Sig Dispense Refill  . amLODipine (NORVASC) 10 MG tablet Take 10 mg by mouth daily.     Marland Kitchen aspirin EC 81 MG tablet Take 81 mg by mouth daily.    Marland Kitchen atorvastatin (LIPITOR) 80 MG tablet Take 1 tablet (80 mg total) by mouth daily. 90 tablet 1  . buPROPion (WELLBUTRIN XL)  150 MG 24 hr tablet Take 450 mg by mouth daily.    . diazepam (VALIUM) 5 MG tablet Take 10 mg by mouth every evening.     Marland Kitchen doxazosin (CARDURA) 4 MG tablet Take 4 mg by mouth 2 (two) times daily.    Marland Kitchen doxepin (SINEQUAN) 10 MG capsule Take 20 mg by mouth at bedtime.     Marland Kitchen labetalol (NORMODYNE) 300 MG tablet Take 300 mg by mouth 2 (two) times daily.    Marland Kitchen levothyroxine (SYNTHROID, LEVOTHROID) 175 MCG tablet Take  175 mcg by mouth daily before breakfast.     . lisinopril (PRINIVIL,ZESTRIL) 10 MG tablet Take 10 mg by mouth daily.    Marland Kitchen omeprazole (PRILOSEC) 40 MG capsule Take 40 mg by mouth daily.    Marland Kitchen oxycodone (ROXICODONE) 30 MG immediate release tablet Take 30 mg by mouth every 8 (eight) hours as needed for pain.    . ziprasidone (GEODON) 40 MG capsule Take 40 mg by mouth daily.    Marland Kitchen EPINEPHrine (EPIPEN 2-PAK) 0.3 mg/0.3 mL IJ SOAJ injection Inject 0.3 mLs (0.3 mg total) into the muscle once. 1 Device 1  . nitroGLYCERIN (NITROSTAT) 0.4 MG SL tablet Place 0.4 mg under the tongue every 5 (five) minutes as needed for chest pain.     No current facility-administered medications for this visit.     Allergies as of 04/08/2018 - Review Complete 04/08/2018  Allergen Reaction Noted  . Bee venom Anaphylaxis 12/05/2013    Vitals: BP 139/87 (BP Location: Right Arm, Patient Position: Sitting)   Pulse 65   Ht 5\' 9"  (1.753 m)   Wt 173 lb (78.5 kg)   BMI 25.55 kg/m  Last Weight:  Wt Readings from Last 1 Encounters:  04/08/18 173 lb (78.5 kg)   Last Height:   Ht Readings from Last 1 Encounters:  04/08/18 5\' 9"  (1.753 m)     Physical exam: Exam: Gen: NAD, conversant, well nourised, well groomed                     CV: RRR, no MRG. No Carotid Bruits. No peripheral edema, warm, nontender Eyes: Conjunctivae clear without exudates or hemorrhage  Neuro: Detailed Neurologic Exam  Speech:    Speech is normal; fluent and spontaneous with normal comprehension.  Cognition:    The patient is  oriented to person, place, and time;     recent and remote memory intact;     language fluent;     normal attention, concentration,     fund of knowledge Cranial Nerves:    The pupils are equal, round, and reactive to light. The fundi are normal and spontaneous venous pulsations are present. Visual fields are full to finger confrontation. Extraocular movements are intact. Trigeminal sensation is intact and the muscles of mastication are normal. The face is symmetric. The palate elevates in the midline. Hearing intact. Voice is normal. Shoulder shrug is normal. The tongue has normal motion without fasciculations.   Coordination:    Normal finger to nose and heel to shin. Normal rapid alternating movements.   Gait:    Heel-toe and tandem gait are normal.   Motor Observation:    No asymmetry, no atrophy, and no involuntary movements noted. Tone:    Normal muscle tone.    Posture:    Posture is normal. normal erect    Strength:    Strength is V/V in the upper and lower limbs.      Sensation: intact to LT     Reflex Exam:  DTR's:    Deep tendon reflexes in the upper and lower extremities are normal bilaterally.   Toes:    The toes are downgoing bilaterally.   Clonus:    Clonus is absent.     Assessment/Plan:   Dr. Pete Glatter for vertebral artery occlusion. PMHx tobacco abuse, HLD, HTN, bipolar  - Very nice gentleman here for syncopal episodes on neck extension likely due to vertebrobasilar insufficiency need to check for strokes and other etiologies such as schwannoma or masses given  positional syncope - Needs control of vascular risk factors, he has done well and stopped smoking and started HLD management. Continie asa - will check hgbaic and bmp for labs - Needs cerebral angiogram and possible stenting will defer to Dr. Corliss Skains  Orders Placed This Encounter  Procedures  . MR BRAIN W WO CONTRAST  . MR MRA HEAD WO CONTRAST  . Basic Metabolic Panel  . Hemoglobin A1c  .  Ambulatory referral to Interventional Radiology        Naomie Dean, MD  [facility] [address] [place], [place] [address]  Phone [phone] Fax [phone]

## 2018-04-08 NOTE — Telephone Encounter (Signed)
[employer] Medicare order sent to GI. No auth they will reach out to the pt to schedule.  °

## 2018-04-08 NOTE — Patient Instructions (Signed)
MRI brain MRA head Labs Dr. Corliss Skainseveshwar   Stroke Prevention Some health problems and behaviors may make it more likely for you to have a stroke. Below are ways to lessen your risk of having a stroke.  Be active for at least 30 minutes on most or all days.  Do not smoke. Try not to be around others who smoke.  Do not drink too much alcohol. ? Do not have more than 2 drinks a day if you are a man. ? Do not have more than 1 drink a day if you are a woman and are not pregnant.  Eat healthy foods, such as fruits and vegetables. If you were put on a specific diet, follow the diet as told.  Keep your cholesterol levels under control through diet and medicines. Look for foods that are low in saturated fat, trans fat, cholesterol, and are high in fiber.  If you have diabetes, follow all diet plans and take your medicine as told.  Ask your doctor if you need treatment to lower your blood pressure. If you have high blood pressure (hypertension), follow all diet plans and take your medicine as told by your doctor.  If you are 8218-56 years old, have your blood pressure checked every 3-5 years. If you are age 56 or older, have your blood pressure checked every year.  Keep a healthy weight. Eat foods that are low in calories, salt, saturated fat, trans fat, and cholesterol.  Do not take drugs.  Avoid birth control pills, if this applies. Talk to your doctor about the risks of taking birth control pills.  Talk to your doctor if you have sleep problems (sleep apnea).  Take all medicine as told by your doctor. ? You may be told to take aspirin or blood thinner medicine. Take this medicine as told by your doctor. ? Understand your medicine instructions.  Make sure any other conditions you have are being taken care of.  Get help right away if:  You suddenly lose feeling (you feel numb) or have weakness in your face, arm, or leg.  Your face or eyelid hangs down to one side.  You suddenly feel  confused.  You have trouble talking (aphasia) or understanding what people are saying.  You suddenly have trouble seeing in one or both eyes.  You suddenly have trouble walking.  You are dizzy.  You lose your balance or your movements are clumsy (uncoordinated).  You suddenly have a very bad headache and you do not know the cause.  You have new chest pain.  Your heart feels like it is fluttering or skipping a beat (irregular heartbeat). Do not wait to see if the symptoms above go away. Get help right away. Call your local emergency services (911 in [place] Do not drive yourself to the hospital. This information is not intended to replace advice given to you by your health care provider. Make sure you discuss any questions you have with your health care provider. Document Released: 02/20/2012 Document Revised: 01/27/2016 Document Reviewed: 02/21/2013 Elsevier Interactive Patient Education  [employer]2018 [employer]

## 2018-04-09 LAB — BASIC METABOLIC PANEL
BUN / CREAT RATIO: 18 (ref 9–20)
BUN: 16 mg/dL (ref 6–24)
CO2: 26 mmol/L (ref 20–29)
CREATININE: 0.89 mg/dL (ref 0.76–1.27)
Calcium: 9.7 mg/dL (ref 8.7–10.2)
Chloride: 102 mmol/L (ref 96–106)
GFR calc Af Amer: 111 mL/min/{1.73_m2} (ref >59)
GFR calc non Af Amer: 96 mL/min/{1.73_m2} (ref >59)
GLUCOSE: 114 mg/dL — AB (ref 65–99)
Potassium: 5.1 mmol/L (ref 3.5–5.2)
SODIUM: 141 mmol/L (ref 134–144)

## 2018-04-09 LAB — HEMOGLOBIN A1C
ESTIMATED AVERAGE GLUCOSE: 114 mg/dL
Hgb A1c MFr Bld: 5.6 % (ref 4.8–5.6)

## 2018-04-10 ENCOUNTER — Other Ambulatory Visit: Payer: Medicare Other | Admitting: *Deleted

## 2018-04-10 DIAGNOSIS — I10 Essential (primary) hypertension: Secondary | ICD-10-CM

## 2018-04-10 DIAGNOSIS — I2510 Atherosclerotic heart disease of native coronary artery without angina pectoris: Secondary | ICD-10-CM

## 2018-04-10 DIAGNOSIS — R0989 Other specified symptoms and signs involving the circulatory and respiratory systems: Secondary | ICD-10-CM

## 2018-04-10 DIAGNOSIS — R55 Syncope and collapse: Secondary | ICD-10-CM

## 2018-04-10 DIAGNOSIS — E782 Mixed hyperlipidemia: Secondary | ICD-10-CM

## 2018-04-10 LAB — HEPATIC FUNCTION PANEL
ALK PHOS: 125 IU/L — AB (ref 39–117)
ALT: 54 IU/L — AB (ref 0–44)
AST: 30 IU/L (ref 0–40)
Albumin: 4.6 g/dL (ref 3.5–5.5)
BILIRUBIN, DIRECT: 0.13 mg/dL (ref 0.00–0.40)
Bilirubin Total: 0.5 mg/dL (ref 0.0–1.2)
Total Protein: 6.8 g/dL (ref 6.0–8.5)

## 2018-04-10 LAB — LIPID PANEL
CHOLESTEROL TOTAL: 134 mg/dL (ref 100–199)
Chol/HDL Ratio: 2.5 ratio (ref 0.0–5.0)
HDL: 53 mg/dL (ref >39)
LDL Calculated: 59 mg/dL (ref 0–99)
TRIGLYCERIDES: 111 mg/dL (ref 0–149)
VLDL Cholesterol Cal: 22 mg/dL (ref 5–40)

## 2018-04-11 ENCOUNTER — Other Ambulatory Visit: Payer: Self-pay | Admitting: Neurology

## 2018-04-11 ENCOUNTER — Telehealth: Payer: Self-pay

## 2018-04-11 DIAGNOSIS — E782 Mixed hyperlipidemia: Secondary | ICD-10-CM

## 2018-04-11 DIAGNOSIS — T1590XA Foreign body on external eye, part unspecified, unspecified eye, initial encounter: Secondary | ICD-10-CM

## 2018-04-11 MED ORDER — ATORVASTATIN CALCIUM 40 MG PO TABS: 40.0000 mg | ORAL_TABLET | Freq: Every day | ORAL | 3 refills | Status: DC

## 2018-04-11 NOTE — Telephone Encounter (Signed)
-----   Message from Dyann KiefMichele M Lenze, PA-C sent at 04/11/2018  1:30 PM EDT ----- LDL at goal but liver function up slightly. Decrease lipitor to 40 mg once daily. Repeat lipids and liver in 2 months

## 2018-04-11 NOTE — Telephone Encounter (Signed)
CVM left for Pt.  Advised to decrease Lipitor to 40 mg daily.  Entered repeat lab work.  Requested Pt notify office what day in 2 months Pt could come in for repeat lab work.  Will send MyChart message.

## 2018-04-16 ENCOUNTER — Ambulatory Visit
Admission: RE | Admit: 2018-04-16 | Discharge: 2018-04-16 | Disposition: A | Discharge status: Home / Self Care | Payer: Medicare Other | Source: Ambulatory Visit | Attending: Neurology | Admitting: Neurology

## 2018-04-16 DIAGNOSIS — I6503 Occlusion and stenosis of bilateral vertebral arteries: Secondary | ICD-10-CM

## 2018-04-16 DIAGNOSIS — T1590XA Foreign body on external eye, part unspecified, unspecified eye, initial encounter: Secondary | ICD-10-CM

## 2018-04-16 DIAGNOSIS — R55 Syncope and collapse: Secondary | ICD-10-CM

## 2018-04-16 DIAGNOSIS — I63012 Cerebral infarction due to thrombosis of left vertebral artery: Secondary | ICD-10-CM

## 2018-04-16 DIAGNOSIS — G450 Vertebro-basilar artery syndrome: Secondary | ICD-10-CM

## 2018-04-16 MED ORDER — GADOBENATE DIMEGLUMINE 529 MG/ML IV SOLN
15.0000 mL | Freq: Once | INTRAVENOUS | Status: AC | PRN
  Administered 2018-04-16: 15 mL via INTRAVENOUS

## 2018-04-19 ENCOUNTER — Ambulatory Visit (HOSPITAL_COMMUNITY)
Admission: RE | Admit: 2018-04-19 | Discharge: 2018-04-19 | Disposition: A | Discharge status: Home / Self Care | Payer: Medicare Other | Source: Ambulatory Visit | Attending: Interventional Radiology | Admitting: Interventional Radiology

## 2018-04-19 DIAGNOSIS — I6503 Occlusion and stenosis of bilateral vertebral arteries: Secondary | ICD-10-CM

## 2018-04-19 NOTE — Consult Note (Signed)
Chief Complaint: Patient was seen in consultation today for bilateral vertebral artery stenosis.  Referring Physician(s): Anson Fret  Supervising Physician: Julieanne Cotton  Patient Status: [facility] - Out-pt  History of Present Illness: [name] is a 57 y.o. male with a past medical history of hypertension, mixed hyperlipidemia, Barrett's esophagus, GERD, hiatal hernia, prostatitis, Grave's disease, cervical and lumbar DDD, arthritis, bipolar 1 disorder, depression, and tobacco abuse x35 years and has recently quit 2 months ago. He has had episodes of dizziness/syncope for years. He was discussing these symptoms with his cardiologist, Jacolyn Reedy, PA-C, who ordered a CTA neck which revealed bilateral vertebral artery stenosis. These findings prompted a referral to neurology, and he saw Dr. Lucia Gaskins for management.  CTA neck 04/04/2018: 1. Positive for moderate to severe stenosis of both distal vertebral arteries in the posterior fossa related to soft and calcified plaque. The vertebrobasilar junction remains patent. There is mild to moderate plaque and stenosis in the proximal basilar artery. 2. No significant subclavian or cervical carotid artery stenosis despite intermittent soft and calcified plaque. Note that there is at least moderate calcified atherosclerosis of both ICA siphons at the skull base. 3.  Aortic Atherosclerosis (ICD10-I70.0). 4. Evidence of degenerative cervical spinal stenosis at C3-C4.  MRI/MRA brain/head 04/16/2018: 1. Unremarkable MRI brain (with and without). No acute findings. 2. Normal MRA head (without).  IR requested by Dr. Lucia Gaskins for management of bilateral vertebral artery stenosis. Patient awake and alert sitting in chair. Complains of episodes of dizziness and syncope. States that if he looks up or looks to the right for too long, he gets dizzy, lightheaded, and his head becomes numb and tingles. States that if he does not lay down, he can pass out  from symptoms. States that this has occurred for years. States that he has lost consciousness 5 times. Complains of intermittent headaches. States headaches are located on his posterior head. States they occur about once a month. Rates headaches as 5/10. States he takes Aspirin for headaches with relief of symptom. Denies weakness, vision changes, hearing changes, tinnitus, or speech difficulty.  Patient is currently taking Aspirin 81 mg once daily.   Past Medical History:  Diagnosis Date  . Anginal pain (HCC) 2000   takes NTG prn  . Arthritis    neck and knee  . Barrett's esophagus   . Bipolar 1 disorder (HCC)   . Cervical disc syndrome   . Chest pain 10/04/2011  . Depression   . GERD (gastroesophageal reflux disease)   . Grave's disease   . Graves' disease 10/04/2011  . H/O prostatitis   . Hiatal hernia   . HTN (hypertension) 10/05/2011  . Hyperlipidemia   . Hypertension   . Lumbar degenerative disc disease   . Mixed hyperlipidemia 12/27/2017  . Tobacco abuse 12/27/2017    Past Surgical History:  Procedure Laterality Date  . CARDIAC CATHETERIZATION  2000  . CHOLECYSTECTOMY  12/11/2013   DR Andrey Campanile   . CHOLECYSTECTOMY N/A 12/11/2013   Procedure: LAPAROSCOPIC CHOLECYSTECTOMY WITH INTRAOPERATIVE CHOLANGIOGRAM;  Surgeon: Atilano Ina, MD;  Location: [facility] OR;  Service: General;  Laterality: N/A;  . KNEE SURGERY    . LEFT HEART CATH AND CORONARY ANGIOGRAPHY N/A 03/12/2018   Procedure: LEFT HEART CATH AND CORONARY ANGIOGRAPHY;  Surgeon: Lyn Records, MD;  Location: [facility] INVASIVE CV LAB;  Service: Cardiovascular;  Laterality: N/A;  . testicular torsion surgery    . THYROIDECTOMY      Allergies: Bee venom  Medications:  Prior to Admission medications   Medication Sig Start Date End Date Taking? Authorizing Provider  amLODipine (NORVASC) 10 MG tablet Take 10 mg by mouth daily.  03/18/16   [provider]  aspirin EC 81 MG tablet Take 81 mg by mouth daily.    [provider]  atorvastatin (LIPITOR) 40 MG tablet Take 1 tablet (40 mg total) by mouth daily. 04/11/18 07/10/18  Dyann Kief, PA-C  buPROPion (WELLBUTRIN XL) 150 MG 24 hr tablet Take 450 mg by mouth daily.    [provider]  diazepam (VALIUM) 5 MG tablet Take 10 mg by mouth every evening.     [provider]  doxazosin (CARDURA) 4 MG tablet Take 4 mg by mouth 2 (two) times daily.    [provider]  doxepin (SINEQUAN) 10 MG capsule Take 20 mg by mouth at bedtime.     [provider]  EPINEPHrine (EPIPEN 2-PAK) 0.3 mg/0.3 mL IJ SOAJ injection Inject 0.3 mLs (0.3 mg total) into the muscle once. 01/12/15   Geoffery Lyons, MD  labetalol (NORMODYNE) 300 MG tablet Take 300 mg by mouth 2 (two) times daily. 12/13/15   [provider]  levothyroxine (SYNTHROID, LEVOTHROID) 175 MCG tablet Take 175 mcg by mouth daily before breakfast.  04/01/16   [provider]  lisinopril (PRINIVIL,ZESTRIL) 10 MG tablet Take 10 mg by mouth daily.    [provider]  nitroGLYCERIN (NITROSTAT) 0.4 MG SL tablet Place 0.4 mg under the tongue every 5 (five) minutes as needed for chest pain.    [provider]  omeprazole (PRILOSEC) 40 MG capsule Take 40 mg by mouth daily.    [provider]  oxycodone (ROXICODONE) 30 MG immediate release tablet Take 30 mg by mouth every 8 (eight) hours as needed for pain.    [provider]  ziprasidone (GEODON) 40 MG capsule Take 40 mg by mouth daily.    [provider]     Family History  Problem Relation Age of Onset  . Hypertension Mother   . Hypertension Father   . Valvular heart disease Father   . Cancer Other   . Hypertension Other     Social History   Socioeconomic History  . Marital status: Legally Separated    Spouse name: Not on file  . Number of children: 1  . Years of education: Not on file  . Highest education level: Some college, no degree  Occupational History  .  Not on file  Social Needs  . Financial resource strain: Not on file  . Food insecurity:    Worry: Not on file    Inability: Not on file  . Transportation needs:    Medical: Not on file    Non-medical: Not on file  Tobacco Use  . Smoking status: Former Smoker    Packs/day: 0.25    Years: 35.00    Pack years: 8.75    Types: Cigarettes    Last attempt to quit: 01/28/2015    Years since quitting: 3.2  . Smokeless tobacco: Never Used  Substance and Sexual Activity  . Alcohol use: No  . Drug use: No  . Sexual activity: Never  Lifestyle  . Physical activity:    Days per week: Not on file    Minutes per session: Not on file  . Stress: Not on file  Relationships  . Social connections:    Talks on phone: Not on file    Gets together: Not on file  Attends religious service: Not on file    Active member of club or organization: Not on file    Attends meetings of clubs or organizations: Not on file    Relationship status: Not on file  Other Topics Concern  . Not on file  Social History Narrative   Lives at home with his parents    Right handed   Caffeine: about 36 oz of soda daily     Review of Systems: A 12 point ROS discussed and pertinent positives are indicated in the HPI above.  All other systems are negative.  Review of Systems  Constitutional: Negative for chills and fever.  HENT: Negative for hearing loss and tinnitus.   Eyes: Negative for visual disturbance.  Respiratory: Negative for shortness of breath and wheezing.   Cardiovascular: Negative for chest pain and palpitations.  Neurological: Positive for dizziness, syncope, light-headedness, numbness and headaches. Negative for speech difficulty and weakness.  Psychiatric/Behavioral: Negative for behavioral problems and confusion.    Vital Signs: There were no vitals taken for this visit.  Physical Exam  Constitutional: He is oriented to person, place, and time. He appears well-developed and well-nourished. No  distress.  Pulmonary/Chest: Effort normal. No respiratory distress.  Neurological: He is alert and oriented to person, place, and time.  Skin: Skin is warm and dry.  Psychiatric: He has a normal mood and affect. His behavior is normal. Judgment and thought content normal.  Nursing note and vitals reviewed.    Imaging: Dg Eye Foreign Body  Result Date: 04/16/2018 CLINICAL DATA:  Metal working/exposure; clearance prior to MRI EXAM: ORBITS FOR FOREIGN BODY - 2 VIEW COMPARISON:  None. FINDINGS: There is no evidence of metallic foreign body within the orbits. No significant bone abnormality identified. IMPRESSION: No evidence of metallic foreign body within the orbits. Electronically Signed   By: Rise MuBenjamin  McClintock M.D.   On: 04/16/2018 14:38   Ct Angio Neck W Or Wo Contrast  Result Date: 04/04/2018 CLINICAL DATA:  57 year old male with syncope when looking upward or to the left. Query subclavian artery abnormality. EXAM: CT ANGIOGRAPHY NECK TECHNIQUE: Multidetector CT imaging of the neck was performed using the standard protocol during bolus administration of intravenous contrast. Multiplanar CT image reconstructions and MIPs were obtained to evaluate the vascular anatomy. Carotid stenosis measurements (when applicable) are obtained utilizing NASCET criteria, using the distal internal carotid diameter as the denominator. CONTRAST:  80mL ISOVUE-370 IOPAMIDOL (ISOVUE-370) INJECTION 76% COMPARISON:  Head CT without contrast 09/15/2011. Cardiac CTA 02/22/2018. FINDINGS: Skeleton: Carious posterior dentition, especially the anterior right mandible molar. Cervical spine degeneration. Evidence of degenerative cervical spinal stenosis at C3-C4 (sagittal image 153). No acute osseous abnormality identified. Upper chest: Negative visible lung parenchyma. Normal trachea to the level of the carina. No superior mediastinal lymphadenopathy. Other neck: Prior thyroidectomy. Otherwise negative neck soft tissues. No neck  mass or lymphadenopathy. Negative visible brain parenchyma. Visualized orbit soft tissues are within normal limits. Visible paranasal sinuses, tympanic cavities, and mastoids are clear. Aortic arch: 3 vessel arch configuration with mild to moderate arch soft and calcified atherosclerosis. Right carotid system: No brachiocephalic artery or right CCA origin stenosis despite mild atherosclerosis. Intermittent soft plaque in the right CCA proximal to the bifurcation without stenosis. Superimposed mild calcified plaque at the right ICA origin and bulb with no stenosis. Otherwise negative cervical right ICA. The visible right ICA siphon is patent with up to moderate calcified plaque. Left carotid system: No left CCA origin stenosis despite some soft plaque along  the medial wall. Intermittent soft plaque proximal to the bifurcation without stenosis. Mild soft and calcified plaque at the lateral left ICA origin, and mild calcified plaque at the posterior distal bulb with no stenosis. Mild calcified plaque of the vessel just below the skull base but no associated stenosis. The visible left ICA siphon is patent with moderate calcified plaque. Vertebral arteries: There is no significant atherosclerosis at the right subclavian artery origin. The proximal right subclavian and right vertebral artery origin appear normal. The distal right subclavian and visible right axillary artery likewise appear within normal limits. There is calcified plaque in the right vertebral V1 segment resulting in mild to moderate stenosis on series 7, image 161. The right V2 and V3 segments are normal. The intracranial right vertebral artery V4 segment is patent but demonstrates moderate to severe stenosis on series 7, image 147 just proximal to the vertebrobasilar junction. Patent vertebrobasilar junction. There is mild to moderate basilar artery plaque and stenosis demonstrated on series 6, image 246. No left subclavian artery origin stenosis despite  mild calcified plaque. There is soft plaque along the medial wall of the vessel over a segment of about 15 millimeters extending to the left vertebral artery origin (series 7, image 171), but less than 50 % stenosis with respect to the distal vessel results, and the left vertebral artery origin remains normal. Distal to the left vertebral artery origin the left subclavian and visible axillary arteries appear normal. The The left vertebral artery appears mildly non dominant and remains normal to the skull base. Plaque in the left V4 segment results in short segment moderate to severe stenosis as demonstrated on series 6, image 222 and series 7, image 155. The left PICA origin remains normal distal to this. The left vertebrobasilar junction is patent. Review of the MIP images confirms the above findings IMPRESSION: 1. Positive for moderate to severe stenosis of both distal vertebral arteries in the posterior fossa related to soft and calcified plaque. The vertebrobasilar junction remains patent. There is mild to moderate plaque and stenosis in the proximal basilar artery. 2. No significant subclavian or cervical carotid artery stenosis despite intermittent soft and calcified plaque. Note that there is at least moderate calcified atherosclerosis of both ICA siphons at the skull base. 3.  Aortic Atherosclerosis (ICD10-I70.0). 4. Evidence of degenerative cervical spinal stenosis at C3-C4. Electronically Signed   By: Odessa FlemingH  Hall M.D.   On: 04/04/2018 13:06   Mr Shirlee LatchMra Head XLWo Contrast  Result Date: 04/18/2018 [facility] NEUROIMAGING REPORT STUDY DATE: 04/16/18 PATIENT NAME: Rosealee AlbeeGrant M Delpino DOB: [birth year] MRN: [id number] ORDERING CLINICIAN: Naomie DeanAntonia Ahern, MD CLINICAL HISTORY: 57 year old male with headaches. EXAM: MRA head (without) TECHNIQUE: MR angiogram of the head was obtained utilizing 3D time of flight sequences from below the vertebrobasilar junction up to the intracranial vasculature without  contrast.  Computerized reconstructions were obtained. CONTRAST: no COMPARISON: none IMAGING SITE: [facility] [address] (1.5 Tesla MRI)  FINDINGS: This study is of adequate technical quality. Flow signal of the bilateral internal carotid arteries have no stenosis. The bilateral middle and anterior cerebral arteries have no stenosis. The bilateral vertebral, basilar, bilateral posterior cerebral arteries have no stenosis. No aneurysmal dilatations are seen.   Normal MRA head (without). INTERPRETING PHYSICIAN: Suanne MarkerVIKRAM R. PENUMALLI, MD Certified in Neurology, Neurophysiology and Neuroimaging [facility] [address], [address] [place], [place] [address] [phone]) [phone]   Mr Lodema PilotBrain W Wo Contrast  Result Date: 04/18/2018 [facility] STUDY DATE: 04/16/18 PATIENT NAME: GIOVANNE NICKOLSON DOB: Dec 22, 1960 MRN: [id number] ORDERING CLINICIAN: Naomie Dean, MD CLINICAL HISTORY: 57 year old male with syncope. EXAM: MRI brain (with and without) TECHNIQUE: MRI of the brain with and without contrast was obtained utilizing 5 mm axial slices with T1, T2, T2 flair, SWI and diffusion weighted views.  T1 sagittal, T2 coronal and postcontrast views in the axial and coronal plane were obtained. CONTRAST: 15ml multihance COMPARISON: 09/15/11 CT IMAGING SITE: [facility] [address] (1.5 Tesla MRI)  FINDINGS: No abnormal lesions are seen on diffusion-weighted views to suggest acute ischemia. The cortical sulci, fissures and cisterns are normal in size and appearance. Lateral, third and fourth ventricle are normal in size and appearance. No extra-axial fluid collections are seen. No evidence of mass effect or midline shift.  Few bifrontal punctate foci of non-specific gliosis. No abnormal lesions are seen on post contrast views.  On sagittal views the posterior fossa, pituitary gland and corpus callosum are unremarkable. No evidence of intracranial  hemorrhage on SWI views. The orbits and their contents, paranasal sinuses and calvarium are unremarkable.  Intracranial flow voids are present.   Unremarkable MRI brain (with and without). No acute findings. INTERPRETING PHYSICIAN: Suanne Marker, MD Certified in Neurology, Neurophysiology and Neuroimaging [facility] [address] [place], [place] [address] [phone]    Labs:  CBC: Recent Labs    02/14/18 0827 02/27/18 1004  WBC 9.4 10.3  HGB 13.6 14.2  HCT 38.7 41.6  PLT 259 278    COAGS: No results for input(s): INR, APTT in the last 8760 hours.  BMP: Recent Labs    02/14/18 0827 02/27/18 1004 04/08/18 0825  NA 140 139 141  K 4.2 4.4 5.1  CL 104 101 102  CO2 24 25 26   GLUCOSE 96 115* 114*  BUN 18 18 16   CALCIUM 8.9 9.5 9.7  CREATININE 0.77 0.88 0.89  GFRNONAA 102 97 96  GFRAA 118 112 111    LIVER FUNCTION TESTS: Recent Labs    02/14/18 0827 04/10/18 0907  BILITOT 0.3 0.5  AST 13 30  ALT 17 54*  ALKPHOS 108 125*  PROT 6.5 6.8  ALBUMIN 4.4 4.6    TUMOR MARKERS: No results for input(s): AFPTM, CEA, CA199, CHROMGRNA in the last 8760 hours.  Assessment and Plan:  Bilateral vertebral artery stenosis. Reviewed imaging with patient. Brought to his attention was his bilateral vertebral artery stenosis. Explained that this could be the cause of his symptoms. Explained that the best course of management at this time is with a procedure called an image-guided diagnostic cerebral angiogram. Explained procedure, including risks and benefits. Explained that the results of this procedure will give [place] an accurate idea of the quantity of his stenosis, and determine treatment options. Informed patient that if treatment is indicated, it would be the same approach (via cerebral angiogram), but on a different day. Patient expresses desire to move forward with an image-guided diagnostic cerebral angiogram.  Plan for follow-up with an  image-guided diagnostic cerebral angiogram ASAP. Informed patient that our schedulers will call him to set up this procedure. Instructed patient to continue taking Aspirin 81 mg once daily.  All questions answered and concerns addressed. Patient conveys understanding and agrees with plan.  Thank you for this interesting consult.  I greatly enjoyed meeting [name] and look forward to participating in their care.  A copy of this report was sent to the requesting  provider on this date.  Electronically Signed: Elwin Mocha, PA-C 04/19/2018, 9:16 AM   I spent a total of 30 Minutes in face to face in clinical consultation, greater than 50% of which was counseling/coordinating care for bilateral vertebral artery stenosis.

## 2018-04-24 ENCOUNTER — Other Ambulatory Visit (HOSPITAL_COMMUNITY): Payer: Self-pay | Admitting: Interventional Radiology

## 2018-04-24 DIAGNOSIS — I771 Stricture of artery: Secondary | ICD-10-CM

## 2018-04-25 ENCOUNTER — Telehealth (HOSPITAL_COMMUNITY): Payer: Self-pay

## 2018-04-25 NOTE — Telephone Encounter (Signed)
Called to schedule angio, no answer, left vm. AW  

## 2018-05-01 ENCOUNTER — Other Ambulatory Visit: Payer: Self-pay | Admitting: Radiology

## 2018-05-02 ENCOUNTER — Encounter (HOSPITAL_COMMUNITY): Payer: Self-pay

## 2018-05-02 ENCOUNTER — Other Ambulatory Visit: Payer: Self-pay

## 2018-05-02 ENCOUNTER — Ambulatory Visit (HOSPITAL_COMMUNITY)
Admission: RE | Admit: 2018-05-02 | Discharge: 2018-05-02 | Disposition: A | Discharge status: Home / Self Care | Payer: Medicare Other | Source: Ambulatory Visit | Attending: Interventional Radiology | Admitting: Interventional Radiology

## 2018-05-02 ENCOUNTER — Other Ambulatory Visit (HOSPITAL_COMMUNITY): Payer: Self-pay | Admitting: Interventional Radiology

## 2018-05-02 DIAGNOSIS — M5030 Other cervical disc degeneration, unspecified cervical region: Secondary | ICD-10-CM | POA: Insufficient documentation

## 2018-05-02 DIAGNOSIS — I10 Essential (primary) hypertension: Secondary | ICD-10-CM | POA: Diagnosis not present

## 2018-05-02 DIAGNOSIS — Z87891 Personal history of nicotine dependence: Secondary | ICD-10-CM | POA: Insufficient documentation

## 2018-05-02 DIAGNOSIS — M1990 Unspecified osteoarthritis, unspecified site: Secondary | ICD-10-CM | POA: Diagnosis not present

## 2018-05-02 DIAGNOSIS — I700 Atherosclerosis of aorta: Secondary | ICD-10-CM | POA: Diagnosis not present

## 2018-05-02 DIAGNOSIS — E782 Mixed hyperlipidemia: Secondary | ICD-10-CM | POA: Insufficient documentation

## 2018-05-02 DIAGNOSIS — I771 Stricture of artery: Secondary | ICD-10-CM

## 2018-05-02 DIAGNOSIS — K219 Gastro-esophageal reflux disease without esophagitis: Secondary | ICD-10-CM | POA: Diagnosis not present

## 2018-05-02 DIAGNOSIS — Z7982 Long term (current) use of aspirin: Secondary | ICD-10-CM | POA: Diagnosis not present

## 2018-05-02 DIAGNOSIS — M5136 Other intervertebral disc degeneration, lumbar region: Secondary | ICD-10-CM | POA: Diagnosis not present

## 2018-05-02 DIAGNOSIS — F319 Bipolar disorder, unspecified: Secondary | ICD-10-CM | POA: Insufficient documentation

## 2018-05-02 DIAGNOSIS — G450 Vertebro-basilar artery syndrome: Secondary | ICD-10-CM | POA: Insufficient documentation

## 2018-05-02 HISTORY — PX: IR ANGIO VERTEBRAL SEL VERTEBRAL BILAT MOD SED: IMG5369

## 2018-05-02 HISTORY — PX: IR ANGIO INTRA EXTRACRAN SEL COM CAROTID INNOMINATE BILAT MOD SED: IMG5360

## 2018-05-02 LAB — CBC
HCT: 40.6 % (ref 39.0–52.0)
Hemoglobin: 13.6 g/dL (ref 13.0–17.0)
MCH: 30.2 pg (ref 26.0–34.0)
MCHC: 33.5 g/dL (ref 30.0–36.0)
MCV: 90 fL (ref 78.0–100.0)
PLATELETS: 237 10*3/uL (ref 150–400)
RBC: 4.51 MIL/uL (ref 4.22–5.81)
RDW: 12.2 % (ref 11.5–15.5)
WBC: 11 10*3/uL — ABNORMAL HIGH (ref 4.0–10.5)

## 2018-05-02 LAB — BASIC METABOLIC PANEL
Anion gap: 8 (ref 5–15)
BUN: 13 mg/dL (ref 6–20)
CO2: 26 mmol/L (ref 22–32)
CREATININE: 0.94 mg/dL (ref 0.61–1.24)
Calcium: 9.2 mg/dL (ref 8.9–10.3)
Chloride: 106 mmol/L (ref 98–111)
GFR calc Af Amer: >60 mL/min (ref >60)
GFR calc non Af Amer: >60 mL/min (ref >60)
Glucose, Bld: 109 mg/dL — ABNORMAL HIGH (ref 70–99)
Potassium: 4.1 mmol/L (ref 3.5–5.1)
SODIUM: 140 mmol/L (ref 135–145)

## 2018-05-02 LAB — PROTIME-INR
INR: 0.95
Prothrombin Time: 12.6 seconds (ref 11.4–15.2)

## 2018-05-02 MED ORDER — IOPAMIDOL (ISOVUE-300) INJECTION 61%
INTRAVENOUS | Status: AC
  Filled 2018-05-02: qty 50

## 2018-05-02 MED ORDER — MIDAZOLAM HCL 2 MG/2ML IJ SOLN
INTRAMUSCULAR | Status: AC | PRN
  Administered 2018-05-02: 1 mg via INTRAVENOUS

## 2018-05-02 MED ORDER — LIDOCAINE HCL 1 % IJ SOLN
INTRAMUSCULAR | Status: AC
  Filled 2018-05-02: qty 20

## 2018-05-02 MED ORDER — SODIUM CHLORIDE 0.9 % IV SOLN
Freq: Once | INTRAVENOUS | Status: AC
  Administered 2018-05-02: 09:00:00 via INTRAVENOUS

## 2018-05-02 MED ORDER — FENTANYL CITRATE (PF) 100 MCG/2ML IJ SOLN
INTRAMUSCULAR | Status: AC
  Filled 2018-05-02: qty 2

## 2018-05-02 MED ORDER — SODIUM CHLORIDE 0.9 % IV SOLN: INTRAVENOUS | Status: AC

## 2018-05-02 MED ORDER — HEPARIN SODIUM (PORCINE) 1000 UNIT/ML IJ SOLN
INTRAMUSCULAR | Status: AC
  Filled 2018-05-02: qty 1

## 2018-05-02 MED ORDER — LIDOCAINE HCL (PF) 1 % IJ SOLN
INTRAMUSCULAR | Status: AC | PRN
  Administered 2018-05-02: 10 mL

## 2018-05-02 MED ORDER — FENTANYL CITRATE (PF) 100 MCG/2ML IJ SOLN
INTRAMUSCULAR | Status: AC | PRN
  Administered 2018-05-02: 25 ug via INTRAVENOUS

## 2018-05-02 MED ORDER — MIDAZOLAM HCL 2 MG/2ML IJ SOLN
INTRAMUSCULAR | Status: AC
  Filled 2018-05-02: qty 2

## 2018-05-02 MED ORDER — IOHEXOL 300 MG/ML  SOLN: 90.0000 mL | Freq: Once | INTRAMUSCULAR | Status: DC | PRN

## 2018-05-02 MED ORDER — HEPARIN SODIUM (PORCINE) 1000 UNIT/ML IJ SOLN
INTRAMUSCULAR | Status: AC | PRN
  Administered 2018-05-02: 1000 [IU] via INTRAVENOUS

## 2018-05-02 NOTE — H&P (Signed)
Chief Complaint: Vertebral artery stenosis  Referring Physician(s): Naomie Dean  Supervising Physician: Julieanne Cotton  Patient Status: [facility] - Out-pt  History of Present Illness: [name] is a 57 y.o. male with a past medical history of hypertension, mixed hyperlipidemia, Barrett's esophagus, GERD, hiatal hernia, prostatitis, Grave's disease, cervical and lumbar DDD, arthritis, bipolar 1 disorder, depression, and tobacco abuse x35 years and has recently quit 2 months ago.   He has had episodes of dizziness/syncope for years.   He was discussing these symptoms with his cardiologist, Jacolyn Reedy, PA-C, who ordered a CTA neck which revealed bilateral vertebral artery stenosis.   These findings prompted a referral to neurology, and he saw Dr. Lucia Gaskins for management.  CTA neck 04/04/2018: 1. Positive for moderate to severe stenosis of both distal vertebral arteries in the posterior fossa related to soft and calcified plaque. The vertebrobasilar junction remains patent. There is mild to moderate plaque and stenosis in the proximal basilar artery. 2. No significant subclavian or cervical carotid artery stenosis despite intermittent soft and calcified plaque. Note that there is at least moderate calcified atherosclerosis of both ICA siphons at the skull base. 3. Aortic Atherosclerosis (ICD10-I70.0). 4. Evidence of degenerative cervical spinal stenosis at C3-C4.  MRI/MRA brain/head 04/16/2018: 1. Unremarkable MRI brain (with and without). No acute findings. 2. Normal MRA head (without).  He was seen by Dr. Corliss Skains on 04/19/2018 for consideration for angiography for bilateral vertebral artery stenosis.  He continues to complain of episodes of dizziness and syncope.   He also continues to have intermittent headaches.   Denies weakness, vision changes, hearing changes, tinnitus, or speech difficulty.  Patient is currently taking Aspirin 81 mg once daily.  He is here  today for cerebral angiography. He is NPO.    Past Medical History:  Diagnosis Date  . Anginal pain (HCC) 2000   takes NTG prn  . Arthritis    neck and knee  . Barrett's esophagus   . Bipolar 1 disorder (HCC)   . Cervical disc syndrome   . Chest pain 10/04/2011  . Depression   . GERD (gastroesophageal reflux disease)   . Grave's disease   . Graves' disease 10/04/2011  . H/O prostatitis   . Hiatal hernia   . HTN (hypertension) 10/05/2011  . Hyperlipidemia   . Hypertension   . Lumbar degenerative disc disease   . Mixed hyperlipidemia 12/27/2017  . Tobacco abuse 12/27/2017    Past Surgical History:  Procedure Laterality Date  . CARDIAC CATHETERIZATION  2000  . CHOLECYSTECTOMY  12/11/2013   DR Andrey Campanile   . CHOLECYSTECTOMY N/A 12/11/2013   Procedure: LAPAROSCOPIC CHOLECYSTECTOMY WITH INTRAOPERATIVE CHOLANGIOGRAM;  Surgeon: Atilano Ina, MD;  Location: [facility] OR;  Service: General;  Laterality: N/A;  . KNEE SURGERY    . LEFT HEART CATH AND CORONARY ANGIOGRAPHY N/A 03/12/2018   Procedure: LEFT HEART CATH AND CORONARY ANGIOGRAPHY;  Surgeon: Lyn Records, MD;  Location: [facility] INVASIVE CV LAB;  Service: Cardiovascular;  Laterality: N/A;  . testicular torsion surgery    . THYROIDECTOMY      Allergies: Bee venom  Medications: Prior to Admission medications   Medication Sig Start Date End Date Taking? Authorizing Provider  amLODipine (NORVASC) 10 MG tablet Take 10 mg by mouth daily.  03/18/16  Yes [provider]  aspirin EC 81 MG tablet Take 81 mg by mouth daily.   Yes [provider]  atorvastatin (LIPITOR) 40 MG tablet Take 1 tablet (40 mg total)  by mouth daily. 04/11/18 07/10/18 Yes Dyann Kief, PA-C  buPROPion (WELLBUTRIN XL) 150 MG 24 hr tablet Take 450 mg by mouth daily.   Yes [provider]  diazepam (VALIUM) 5 MG tablet Take 10 mg by mouth at bedtime.    Yes [provider]  doxazosin (CARDURA) 4 MG tablet Take 4 mg by mouth 2 (two) times daily.    Yes [provider]  doxepin (SINEQUAN) 10 MG capsule Take 20 mg by mouth at bedtime.    Yes [provider]  labetalol (NORMODYNE) 300 MG tablet Take 300 mg by mouth 2 (two) times daily. 12/13/15  Yes [provider]  levothyroxine (SYNTHROID, LEVOTHROID) 175 MCG tablet Take 175 mcg by mouth daily before breakfast.  04/01/16  Yes [provider]  lisinopril (PRINIVIL,ZESTRIL) 10 MG tablet Take 10 mg by mouth at bedtime.    Yes [provider]  omeprazole (PRILOSEC) 40 MG capsule Take 40 mg by mouth daily.   Yes [provider]  oxycodone (ROXICODONE) 30 MG immediate release tablet Take 30 mg by mouth every 8 (eight) hours as needed for pain.   Yes [provider]  ziprasidone (GEODON) 40 MG capsule Take 40 mg by mouth daily.   Yes [provider]  EPINEPHrine (EPIPEN 2-PAK) 0.3 mg/0.3 mL IJ SOAJ injection Inject 0.3 mLs (0.3 mg total) into the muscle once. Patient not taking: Reported on 04/25/2018 01/12/15   Geoffery Lyons, MD  nitroGLYCERIN (NITROSTAT) 0.4 MG SL tablet Place 0.4 mg under the tongue every 5 (five) minutes as needed for chest pain.    [provider]     Family History  Problem Relation Age of Onset  . Hypertension Mother   . Hypertension Father   . Valvular heart disease Father   . Cancer Other   . Hypertension Other     Social History   Socioeconomic History  . Marital status: Legally Separated    Spouse name: Not on file  . Number of children: 1  . Years of education: Not on file  . Highest education level: Some college, no degree  Occupational History  . Not on file  Social Needs  . Financial resource strain: Not on file  . Food insecurity:    Worry: Not on file    Inability: Not on file  . Transportation needs:    Medical: Not on file    Non-medical: Not on file  Tobacco Use  . Smoking status: Former Smoker    Packs/day: 0.25    Years: 35.00    Pack years: 8.75    Types:  Cigarettes    Last attempt to quit: 01/28/2015    Years since quitting: 3.2  . Smokeless tobacco: Never Used  Substance and Sexual Activity  . Alcohol use: No  . Drug use: No  . Sexual activity: Never  Lifestyle  . Physical activity:    Days per week: Not on file    Minutes per session: Not on file  . Stress: Not on file  Relationships  . Social connections:    Talks on phone: Not on file    Gets together: Not on file    Attends religious service: Not on file    Active member of club or organization: Not on file    Attends meetings of clubs or organizations: Not on file    Relationship status: Not on file  Other Topics Concern  . Not on file  Social History Narrative  Lives at home with his parents    Right handed   Caffeine: about 36 oz of soda daily     Review of Systems: A 12 point ROS discussed and pertinent positives are indicated in the HPI above.  All other systems are negative.  Review of Systems  Vital Signs: BP (!) 153/79   Pulse 66   Temp 98.6 F (37 C) (Oral)   Ht 5\' 9"  (1.753 m)   Wt 78.5 kg   SpO2 98%   BMI 25.55 kg/m   Physical Exam  Constitutional: He is oriented to person, place, and time. He appears well-developed.  HENT:  Head: Normocephalic and atraumatic.  Eyes: EOM are normal.  Neck: Normal range of motion.  Cardiovascular: Normal rate, regular rhythm, normal heart sounds and intact distal pulses.  Pulmonary/Chest: Effort normal and breath sounds normal. No respiratory distress.  Abdominal: Soft. He exhibits no distension. There is no tenderness.  Musculoskeletal: Normal range of motion.  Neurological: He is alert and oriented to person, place, and time. No cranial nerve deficit.  Skin: Skin is warm and dry.  Psychiatric: He has a normal mood and affect. His behavior is normal. Judgment and thought content normal.    Imaging: Dg Eye Foreign Body  Result Date: 04/16/2018 CLINICAL DATA:  Metal working/exposure; clearance prior to MRI  EXAM: ORBITS FOR FOREIGN BODY - 2 VIEW COMPARISON:  None. FINDINGS: There is no evidence of metallic foreign body within the orbits. No significant bone abnormality identified. IMPRESSION: No evidence of metallic foreign body within the orbits. Electronically Signed   By: Rise Mu M.D.   On: 04/16/2018 14:38   Ct Angio Neck W Or Wo Contrast  Result Date: 04/04/2018 CLINICAL DATA:  57 year old male with syncope when looking upward or to the left. Query subclavian artery abnormality. EXAM: CT ANGIOGRAPHY NECK TECHNIQUE: Multidetector CT imaging of the neck was performed using the standard protocol during bolus administration of intravenous contrast. Multiplanar CT image reconstructions and MIPs were obtained to evaluate the vascular anatomy. Carotid stenosis measurements (when applicable) are obtained utilizing NASCET criteria, using the distal internal carotid diameter as the denominator. CONTRAST:  80mL ISOVUE-370 IOPAMIDOL (ISOVUE-370) INJECTION 76% COMPARISON:  Head CT without contrast 09/15/2011. Cardiac CTA 02/22/2018. FINDINGS: Skeleton: Carious posterior dentition, especially the anterior right mandible molar. Cervical spine degeneration. Evidence of degenerative cervical spinal stenosis at C3-C4 (sagittal image 153). No acute osseous abnormality identified. Upper chest: Negative visible lung parenchyma. Normal trachea to the level of the carina. No superior mediastinal lymphadenopathy. Other neck: Prior thyroidectomy. Otherwise negative neck soft tissues. No neck mass or lymphadenopathy. Negative visible brain parenchyma. Visualized orbit soft tissues are within normal limits. Visible paranasal sinuses, tympanic cavities, and mastoids are clear. Aortic arch: 3 vessel arch configuration with mild to moderate arch soft and calcified atherosclerosis. Right carotid system: No brachiocephalic artery or right CCA origin stenosis despite mild atherosclerosis. Intermittent soft plaque in the right CCA  proximal to the bifurcation without stenosis. Superimposed mild calcified plaque at the right ICA origin and bulb with no stenosis. Otherwise negative cervical right ICA. The visible right ICA siphon is patent with up to moderate calcified plaque. Left carotid system: No left CCA origin stenosis despite some soft plaque along the medial wall. Intermittent soft plaque proximal to the bifurcation without stenosis. Mild soft and calcified plaque at the lateral left ICA origin, and mild calcified plaque at the posterior distal bulb with no stenosis. Mild calcified plaque of the vessel just below the  skull base but no associated stenosis. The visible left ICA siphon is patent with moderate calcified plaque. Vertebral arteries: There is no significant atherosclerosis at the right subclavian artery origin. The proximal right subclavian and right vertebral artery origin appear normal. The distal right subclavian and visible right axillary artery likewise appear within normal limits. There is calcified plaque in the right vertebral V1 segment resulting in mild to moderate stenosis on series 7, image 161. The right V2 and V3 segments are normal. The intracranial right vertebral artery V4 segment is patent but demonstrates moderate to severe stenosis on series 7, image 147 just proximal to the vertebrobasilar junction. Patent vertebrobasilar junction. There is mild to moderate basilar artery plaque and stenosis demonstrated on series 6, image 246. No left subclavian artery origin stenosis despite mild calcified plaque. There is soft plaque along the medial wall of the vessel over a segment of about 15 millimeters extending to the left vertebral artery origin (series 7, image 171), but less than 50 % stenosis with respect to the distal vessel results, and the left vertebral artery origin remains normal. Distal to the left vertebral artery origin the left subclavian and visible axillary arteries appear normal. The The left  vertebral artery appears mildly non dominant and remains normal to the skull base. Plaque in the left V4 segment results in short segment moderate to severe stenosis as demonstrated on series 6, image 222 and series 7, image 155. The left PICA origin remains normal distal to this. The left vertebrobasilar junction is patent. Review of the MIP images confirms the above findings IMPRESSION: 1. Positive for moderate to severe stenosis of both distal vertebral arteries in the posterior fossa related to soft and calcified plaque. The vertebrobasilar junction remains patent. There is mild to moderate plaque and stenosis in the proximal basilar artery. 2. No significant subclavian or cervical carotid artery stenosis despite intermittent soft and calcified plaque. Note that there is at least moderate calcified atherosclerosis of both ICA siphons at the skull base. 3.  Aortic Atherosclerosis (ICD10-I70.0). 4. Evidence of degenerative cervical spinal stenosis at C3-C4. Electronically Signed   By: Odessa FlemingH  Hall M.D.   On: 04/04/2018 13:06   Mr Shirlee LatchMra Head ZOWo Contrast  Result Date: 04/18/2018 [facility] NEUROIMAGING REPORT STUDY DATE: 04/16/18 PATIENT NAME: [name] DOB: 03-Apr-1961 MRN: [id number] ORDERING CLINICIAN: Naomie DeanAntonia Ahern, MD CLINICAL HISTORY: 57 year old male with headaches. EXAM: MRA head (without) TECHNIQUE: MR angiogram of the head was obtained utilizing 3D time of flight sequences from below the vertebrobasilar junction up to the intracranial vasculature without contrast.  Computerized reconstructions were obtained. CONTRAST: no COMPARISON: none IMAGING SITE: [facility] [address] (1.5 Tesla MRI)  FINDINGS: This study is of adequate technical quality. Flow signal of the bilateral internal carotid arteries have no stenosis. The bilateral middle and anterior cerebral arteries have no stenosis. The bilateral vertebral, basilar, bilateral posterior cerebral arteries have no  stenosis. No aneurysmal dilatations are seen.   Normal MRA head (without). INTERPRETING PHYSICIAN: Suanne MarkerVIKRAM R. PENUMALLI, MD Certified in Neurology, Neurophysiology and Neuroimaging [facility] [address], [address] [place], [place] [address] [phone]   Mr Lodema PilotBrain W Wo Contrast  Result Date: 04/18/2018 [facility] NEUROIMAGING REPORT STUDY DATE: 04/16/18 PATIENT NAME: [name] DOB: 03-Apr-1961 MRN: [id number] ORDERING CLINICIAN: Naomie DeanAntonia Ahern, MD CLINICAL HISTORY: 57 year old male with syncope. EXAM: MRI brain (with and without) TECHNIQUE: MRI of the brain with and without contrast was obtained  utilizing 5 mm axial slices with T1, T2, T2 flair, SWI and diffusion weighted views.  T1 sagittal, T2 coronal and postcontrast views in the axial and coronal plane were obtained. CONTRAST: 15ml multihance COMPARISON: 09/15/11 CT IMAGING SITE: [facility] [address] (1.5 Tesla MRI)  FINDINGS: No abnormal lesions are seen on diffusion-weighted views to suggest acute ischemia. The cortical sulci, fissures and cisterns are normal in size and appearance. Lateral, third and fourth ventricle are normal in size and appearance. No extra-axial fluid collections are seen. No evidence of mass effect or midline shift.  Few bifrontal punctate foci of non-specific gliosis. No abnormal lesions are seen on post contrast views.  On sagittal views the posterior fossa, pituitary gland and corpus callosum are unremarkable. No evidence of intracranial hemorrhage on SWI views. The orbits and their contents, paranasal sinuses and calvarium are unremarkable.  Intracranial flow voids are present.   Unremarkable MRI brain (with and without). No acute findings. INTERPRETING PHYSICIAN: Suanne Marker, MD Certified in Neurology, Neurophysiology and Neuroimaging [facility] [address], [address] [place], [place] [address] [phone]    Labs:  CBC: Recent Labs     02/14/18 0827 02/27/18 1004 05/02/18 0839  WBC 9.4 10.3 11.0*  HGB 13.6 14.2 13.6  HCT 38.7 41.6 40.6  PLT 259 278 237    COAGS: Recent Labs    05/02/18 0839  INR 0.95    BMP: Recent Labs    02/14/18 0827 02/27/18 1004 04/08/18 0825 05/02/18 0839  NA 140 139 141 140  K 4.2 4.4 5.1 4.1  CL 104 101 102 106  CO2 24 25 26 26   GLUCOSE 96 115* 114* 109*  BUN 18 18 16 13   CALCIUM 8.9 9.5 9.7 9.2  CREATININE 0.77 0.88 0.89 0.94  GFRNONAA 102 97 96 >60  GFRAA 118 112 111 >60    LIVER FUNCTION TESTS: Recent Labs    02/14/18 0827 04/10/18 0907  BILITOT 0.3 0.5  AST 13 30  ALT 17 54*  ALKPHOS 108 125*  PROT 6.5 6.8  ALBUMIN 4.4 4.6    TUMOR MARKERS: No results for input(s): AFPTM, CEA, CA199, CHROMGRNA in the last 8760 hours.  Assessment and Plan:  Symptomatic Bilateral vertebral artery stenosis.  Will proceed with cerebral angiography today by Dr. Corliss Skains.  Risks and benefits of cerebral angiogram with intervention were discussed with the patient including, but not limited to bleeding, infection, vascular injury, contrast induced renal failure, stroke or even death.  This interventional procedure involves the use of X-rays and because of the nature of the planned procedure, it is possible that we will have prolonged use of X-ray fluoroscopy.  Potential radiation risks to you include (but are not limited to) the following: - A slightly elevated risk for cancer  several years later in life. This risk is typically less than 0.5% percent. This risk is low in comparison to the normal incidence of human cancer, which is 33% for women and 50% for men according to the [employer] Society. - Radiation induced injury can include skin redness, resembling a rash, tissue breakdown / ulcers and hair loss (which can be temporary or permanent).   The likelihood of either of these occurring depends on the difficulty of the procedure and whether you are sensitive to  radiation due to previous procedures, disease, or genetic conditions.   IF your procedure requires a prolonged use of radiation, you will be notified and given written instructions for further action.  It is your responsibility to monitor the irradiated area for the 2 weeks following the procedure and to notify your physician if you are concerned that you have suffered a radiation induced injury.    All of the patient's questions were answered, patient is agreeable to proceed.  Consent signed and in chart.  Thank you for this interesting consult.  I greatly enjoyed meeting [name] and look forward to participating in their care.  A copy of this report was sent to the requesting provider on this date.  Electronically Signed: Gwynneth Macleod, PA-C   05/02/2018, 9:39 AM      I spent a total of  25 Minutes in face to face in clinical consultation, greater than 50% of which was counseling/coordinating care for cerebral angiography.

## 2018-05-02 NOTE — Discharge Instructions (Signed)

## 2018-05-02 NOTE — Procedures (Addendum)
S/P 4 vessel cerebral arteriogram  RT CFA aporoach Findings. 1. Approx 67 to 70 % stenosis on AP projection of LT VBJ

## 2018-05-02 NOTE — Sedation Documentation (Signed)
Bedside report, pulse check and groin site check done bedside in room 4.

## 2018-05-08 ENCOUNTER — Encounter (HOSPITAL_COMMUNITY): Payer: Self-pay | Admitting: Interventional Radiology

## 2018-05-28 NOTE — Progress Notes (Signed)
Cardiology Office Note:    Date:  05/29/2018   ID:  [name], DOB Sep 17, 1960, MRN [id number]  PCP:  Merlene Laughter, MD  Cardiologist:  Lesleigh Noe, MD   Referring MD: Merlene Laughter, MD   Chief Complaint  Patient presents with  . Coronary Artery Disease    History of Present Illness:    [name] is a 57 y.o. male with a hx of moderate nonobstructive CAD by cath 2019, hyperlipidemia, hypertension, tobacco abuse, bipolar disorder, and Barrett's esophagus.  Overall, he is doing well.  One episode of angina since last seen in July by Herma Carson.  He is exercising.  Spends a lot of time with his 48-year-old grandson.  The one episode of angina that he had responded promptly to sublingual nitroglycerin.  The episode occurred spontaneously and was not stress-induced.  He is not limited by exertional chest tightness or discomfort.  He is compliant with the current medical regimen.    Past Medical History:  Diagnosis Date  . Anginal pain (HCC) 2000   takes NTG prn  . Arthritis    neck and knee  . Barrett's esophagus   . Bipolar 1 disorder (HCC)   . Cervical disc syndrome   . Chest pain 10/04/2011  . Depression   . GERD (gastroesophageal reflux disease)   . Grave's disease   . Graves' disease 10/04/2011  . H/O prostatitis   . Hiatal hernia   . HTN (hypertension) 10/05/2011  . Hyperlipidemia   . Hypertension   . Lumbar degenerative disc disease   . Mixed hyperlipidemia 12/27/2017  . Tobacco abuse 12/27/2017    Past Surgical History:  Procedure Laterality Date  . CARDIAC CATHETERIZATION  2000  . CHOLECYSTECTOMY  12/11/2013   DR Andrey Campanile   . CHOLECYSTECTOMY N/A 12/11/2013   Procedure: LAPAROSCOPIC CHOLECYSTECTOMY WITH INTRAOPERATIVE CHOLANGIOGRAM;  Surgeon: Atilano Ina, MD;  Location: [facility] OR;  Service: General;  Laterality: N/A;  . IR ANGIO INTRA EXTRACRAN SEL COM CAROTID INNOMINATE BILAT MOD SED  05/02/2018  . IR ANGIO VERTEBRAL SEL VERTEBRAL BILAT MOD SED   05/02/2018  . KNEE SURGERY    . LEFT HEART CATH AND CORONARY ANGIOGRAPHY N/A 03/12/2018   Procedure: LEFT HEART CATH AND CORONARY ANGIOGRAPHY;  Surgeon: Lyn Records, MD;  Location: [facility];  Service: Cardiovascular;  Laterality: N/A;  . testicular torsion surgery    . THYROIDECTOMY      Current Medications: Current Meds  Medication Sig  . amLODipine (NORVASC) 10 MG tablet Take 10 mg by mouth daily.   Marland Kitchen aspirin EC 81 MG tablet Take 81 mg by mouth daily.  Marland Kitchen atorvastatin (LIPITOR) 40 MG tablet Take 1 tablet (40 mg total) by mouth daily.  Marland Kitchen buPROPion (WELLBUTRIN XL) 150 MG 24 hr tablet Take 450 mg by mouth daily.  . diazepam (VALIUM) 5 MG tablet Take 10 mg by mouth at bedtime.   Marland Kitchen doxazosin (CARDURA) 4 MG tablet Take 4 mg by mouth 2 (two) times daily.  Marland Kitchen doxepin (SINEQUAN) 10 MG capsule Take 20 mg by mouth at bedtime.   Marland Kitchen EPINEPHrine (EPIPEN 2-PAK) 0.3 mg/0.3 mL IJ SOAJ injection Inject 0.3 mLs (0.3 mg total) into the muscle once.  . labetalol (NORMODYNE) 300 MG tablet Take 300 mg by mouth 2 (two) times daily.  Marland Kitchen levothyroxine (SYNTHROID, LEVOTHROID) 175 MCG tablet Take 175 mcg by mouth daily before breakfast.   . lisinopril (PRINIVIL,ZESTRIL) 10 MG tablet Take 10 mg by mouth at  bedtime.   . nitroGLYCERIN (NITROSTAT) 0.4 MG SL tablet Place 0.4 mg under the tongue every 5 (five) minutes as needed for chest pain.  Marland Kitchen omeprazole (PRILOSEC) 40 MG capsule Take 40 mg by mouth daily.  Marland Kitchen oxycodone (ROXICODONE) 30 MG immediate release tablet Take 30 mg by mouth every 8 (eight) hours as needed for pain.  . ziprasidone (GEODON) 40 MG capsule Take 40 mg by mouth daily.     Allergies:   Bee venom   Social History   Socioeconomic History  . Marital status: Legally Separated    Spouse name: Not on file  . Number of children: 1  . Years of education: Not on file  . Highest education level: Some college, no degree  Occupational History  . Not on file  Social Needs  . Financial resource  strain: Not on file  . Food insecurity:    Worry: Not on file    Inability: Not on file  . Transportation needs:    Medical: Not on file    Non-medical: Not on file  Tobacco Use  . Smoking status: Former Smoker    Packs/day: 0.25    Years: 35.00    Pack years: 8.75    Types: Cigarettes    Last attempt to quit: 01/28/2015    Years since quitting: 3.3  . Smokeless tobacco: Never Used  Substance and Sexual Activity  . Alcohol use: No  . Drug use: No  . Sexual activity: Never  Lifestyle  . Physical activity:    Days per week: Not on file    Minutes per session: Not on file  . Stress: Not on file  Relationships  . Social connections:    Talks on phone: Not on file    Gets together: Not on file    Attends religious service: Not on file    Active member of club or organization: Not on file    Attends meetings of clubs or organizations: Not on file    Relationship status: Not on file  Other Topics Concern  . Not on file  Social History Narrative   Lives at home with his parents    Right handed   Caffeine: about 36 oz of soda daily     Family History: The patient's family history includes Cancer in his other; Hypertension in his father, mother, and other; Valvular heart disease in his father.  ROS:   Please see the history of present illness.    Depression, back pain, dizziness, passing out, anxiety, headaches, occasional palpitation, and excessive sweating.  All other systems reviewed and are negative.  EKGs/Labs/Other Studies Reviewed:    The following studies were reviewed today: 2D Doppler echocardiogram 01/02/2018: Study Conclusions  - Left ventricle: The cavity size was normal. Wall thickness was   normal. Systolic function was normal. The estimated ejection   fraction was in the range of 55% to 60%. Wall motion was normal;   there were no regional wall motion abnormalities. Left   ventricular diastolic function parameters were normal. - Aortic valve: Trileaflet;  mildly calcified leaflets. Sclerosis   without stenosis. Mean gradient (S): 9 mm Hg. - Mitral valve: There was trivial regurgitation. - Left atrium: The atrium was mildly dilated. - Right ventricle: The cavity size was normal. Systolic function   was normal. - Tricuspid valve: Peak RV-RA gradient (S): 30 mm Hg. - Pulmonary arteries: PA peak pressure: 33 mm Hg (S). - Inferior vena cava: The vessel was normal in size. The  respirophasic diameter changes were in the normal range (>= 50%),   consistent with normal central venous pressure.  Impressions:  - Normal LV size with EF 55-60%. Normal diastolic function. Normal   RV size and systolic function. Aortic valve sclerosis without   significant stenosis.  Cardiac catheterization 03/12/2018: Diagnostic Diagram        EKG:  EKG is not ordered today.    Recent Labs: 04/10/2018: ALT 54 05/02/2018: BUN 13; Creatinine, Ser 0.94; Hemoglobin 13.6; Platelets 237; Potassium 4.1; Sodium 140  Recent Lipid Panel    Component Value Date/Time   CHOL 134 04/10/2018 0907   TRIG 111 04/10/2018 0907   HDL 53 04/10/2018 0907   CHOLHDL 2.5 04/10/2018 0907   CHOLHDL 7.1 10/04/2011 0545   VLDL 74 (H) 10/04/2011 0545   LDLCALC 59 04/10/2018 0907    Physical Exam:    VS:  BP 136/70   Pulse 70   Ht 5\' 9"  (1.753 m)   Wt 179 lb 6.4 oz (81.4 kg)   BMI 26.49 kg/m     Wt Readings from Last 3 Encounters:  05/29/18 179 lb 6.4 oz (81.4 kg)  05/02/18 173 lb (78.5 kg)  04/08/18 173 lb (78.5 kg)     GEN:  Well nourished, well developed in no acute distress HEENT: Normal NECK: No JVD. LYMPHATICS: No lymphadenopathy CARDIAC: RRR, no murmur, no gallop, no edema. VASCULAR: 2+ bilateral radial pulses.  No bruits. RESPIRATORY:  Clear to auscultation without rales, wheezing or rhonchi  ABDOMEN: Soft, non-tender, non-distended, No pulsatile mass, MUSCULOSKELETAL: No deformity  SKIN: Warm and dry NEUROLOGIC:  Alert and oriented x 3 PSYCHIATRIC:   Normal affect   ASSESSMENT:    1. Coronary artery disease involving native coronary artery of native heart without angina pectoris   2. Aortic sclerosis   3. Essential hypertension   4. Bilateral carotid bruits   5. Mixed hyperlipidemia   6. Tobacco abuse    PLAN:    In order of problems listed above:  1. Stable angina responsive to nitroglycerin.  Only one episode over the last 3 months which was nonexertional and promptly responded. 2. No significant murmur on exam today 3. Excellent blood pressure control with target 130/80 mmHg 4. No bruit is heard today. 5. LDL target is less than 70 6. No longer smoking  Moderate intensity aerobic activity equal to greater than 150 minutes/week.  Notify us of change in anginal pattern.  Clinical follow-up in 9 to 12 months.   Medication Adjustments/Labs and Tests Ordered: Current medicines are reviewed at length with the patient today.  Concerns regarding medicines are outlined above.  No orders of the defined types were placed in this encounter.  No orders of the defined types were placed in this encounter.   Patient Instructions  Medication Instructions:  Your physician recommends that you continue on your current medications as directed. Please refer to the Current Medication list given to you today.   Labwork: None ordered  Testing/Procedures: None ordered  Follow-Up: Your physician wants you to follow-up in: 1 year with Dr. Katrinka Blazing. You will receive a reminder letter in the mail two months in advance. If you don't receive a letter, please call our office to schedule the follow-up appointment.   Any Other Special Instructions Will Be Listed Below (If Applicable).     If you need a refill on your cardiac medications before your next appointment, please call your pharmacy.      Signed, Lesleigh Noe, MD  05/29/2018 12:59 PM    [facility]

## 2018-05-29 ENCOUNTER — Encounter: Payer: Self-pay | Admitting: Interventional Cardiology

## 2018-05-29 ENCOUNTER — Ambulatory Visit: Payer: Medicare Other | Admitting: Interventional Cardiology

## 2018-05-29 VITALS — BP 136/70 | HR 70 | Ht 69.0 in | Wt 179.4 lb

## 2018-05-29 DIAGNOSIS — I350 Nonrheumatic aortic (valve) stenosis: Secondary | ICD-10-CM | POA: Diagnosis not present

## 2018-05-29 DIAGNOSIS — R0989 Other specified symptoms and signs involving the circulatory and respiratory systems: Secondary | ICD-10-CM

## 2018-05-29 DIAGNOSIS — I10 Essential (primary) hypertension: Secondary | ICD-10-CM

## 2018-05-29 DIAGNOSIS — I2510 Atherosclerotic heart disease of native coronary artery without angina pectoris: Secondary | ICD-10-CM | POA: Diagnosis not present

## 2018-05-29 DIAGNOSIS — E782 Mixed hyperlipidemia: Secondary | ICD-10-CM

## 2018-05-29 DIAGNOSIS — Z720 Tobacco use: Secondary | ICD-10-CM

## 2018-05-29 NOTE — Patient Instructions (Signed)

## 2018-06-10 ENCOUNTER — Other Ambulatory Visit: Payer: Medicare Other

## 2018-06-11 ENCOUNTER — Other Ambulatory Visit: Payer: Medicare Other

## 2018-09-12 ENCOUNTER — Other Ambulatory Visit: Payer: Self-pay | Admitting: Geriatric Medicine

## 2018-09-12 DIAGNOSIS — F17211 Nicotine dependence, cigarettes, in remission: Secondary | ICD-10-CM

## 2018-09-16 ENCOUNTER — Ambulatory Visit: Payer: Medicare Other

## 2018-09-20 ENCOUNTER — Ambulatory Visit: Payer: Medicare Other

## 2018-10-09 ENCOUNTER — Ambulatory Visit: Payer: Medicare Other | Admitting: Adult Health

## 2019-01-06 ENCOUNTER — Other Ambulatory Visit: Payer: Self-pay | Admitting: Interventional Cardiology

## 2019-01-06 MED ORDER — ATORVASTATIN CALCIUM 40 MG PO TABS: 40.0000 mg | ORAL_TABLET | Freq: Every day | ORAL | 1 refills | Status: DC

## 2019-02-27 IMAGING — DX DG CERVICAL SPINE 2 OR 3 VIEWS
3 series · 3 of 3 positions shown · non-contrast
Comparison: Cervical spine x-rays dated October 08, 2013. MR
cervical spine dated November 11, 2012.

CLINICAL DATA: Chronic neck pain.

EXAM:
CERVICAL SPINE - 2-3 VIEW

[dg cervical spine 2 or 3 views (1 of 3)]
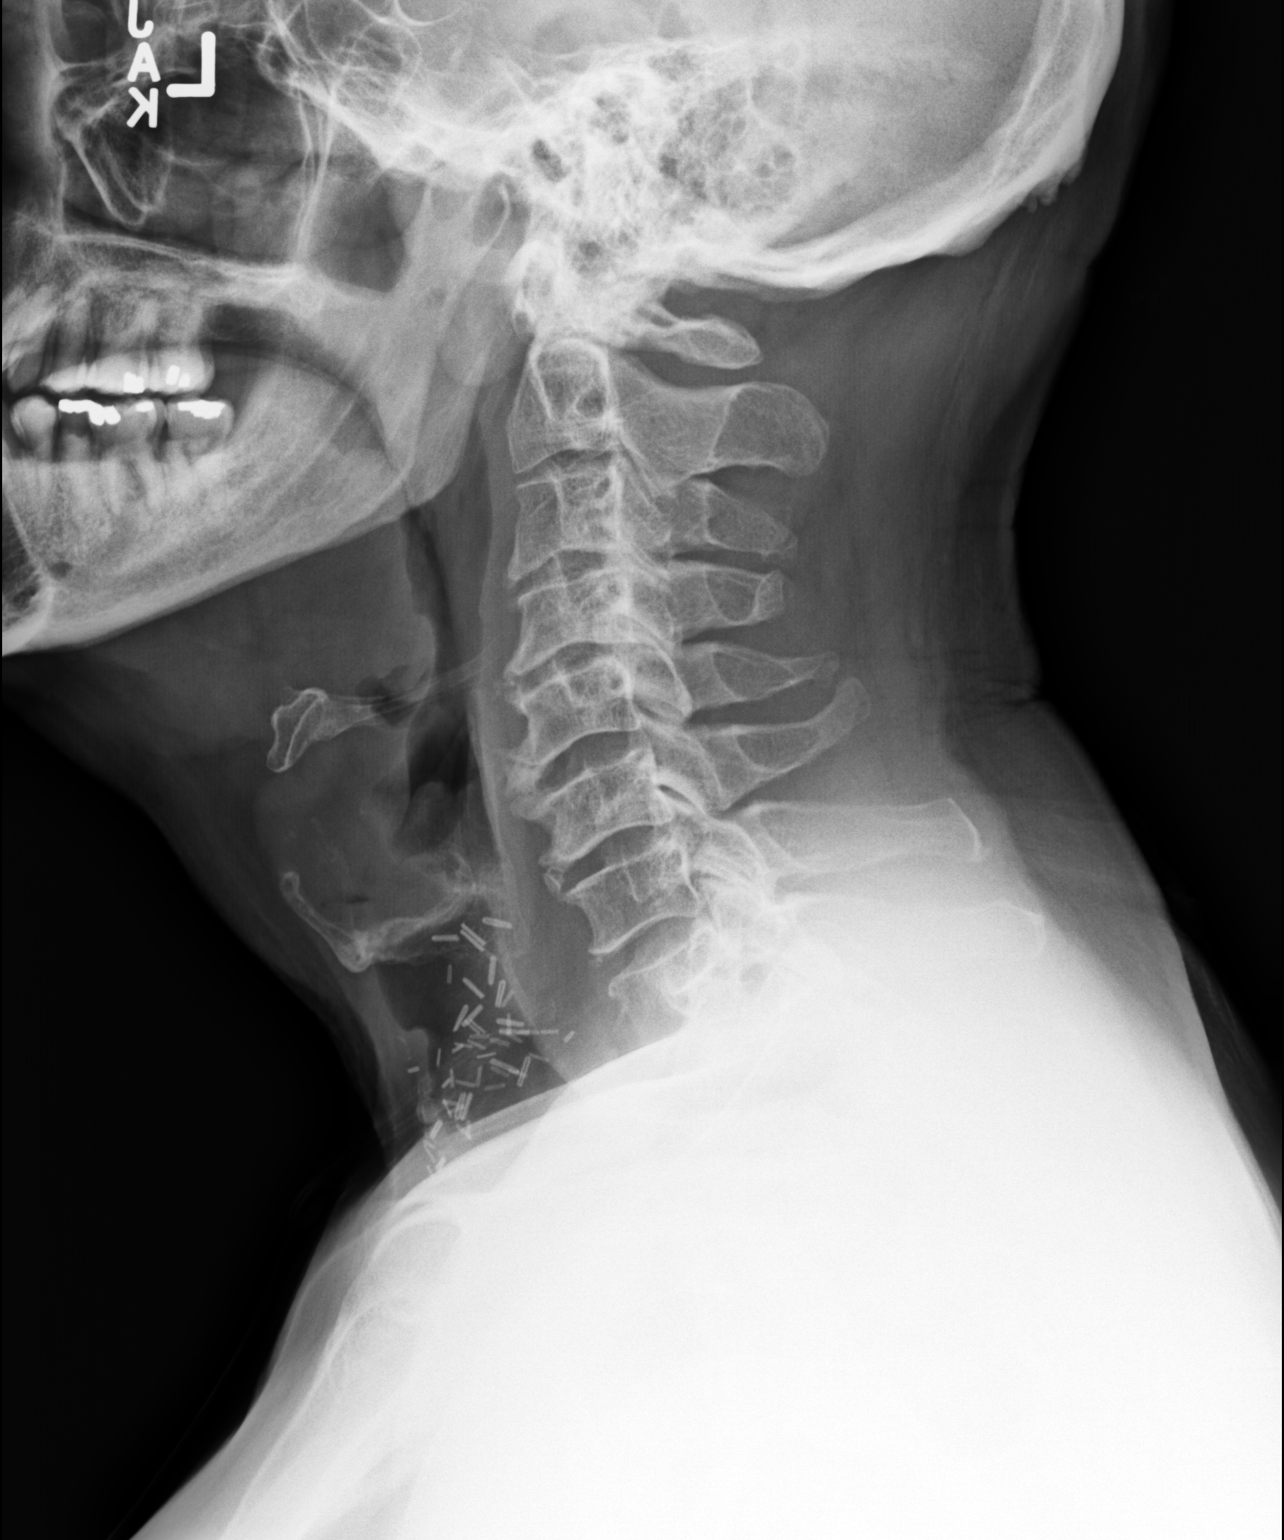

[dg cervical spine 2 or 3 views (2 of 3)]
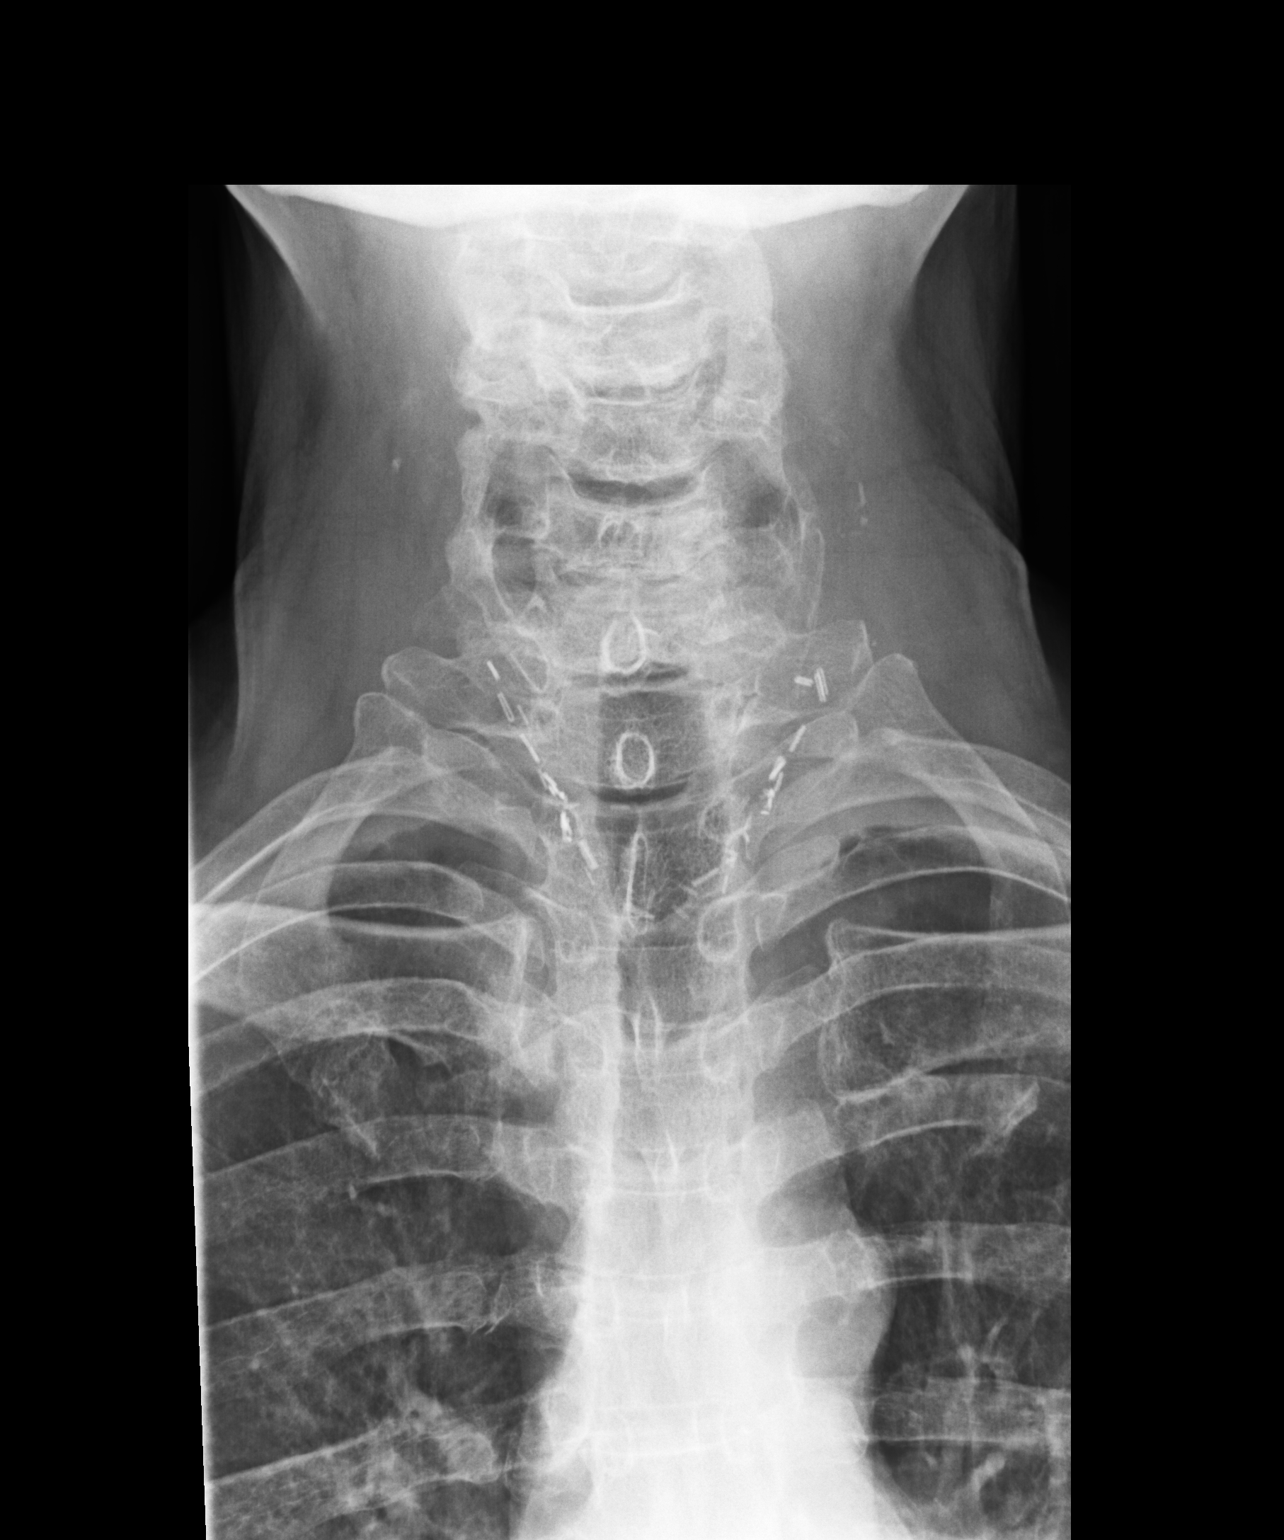

[dg cervical spine 2 or 3 views (3 of 3)]
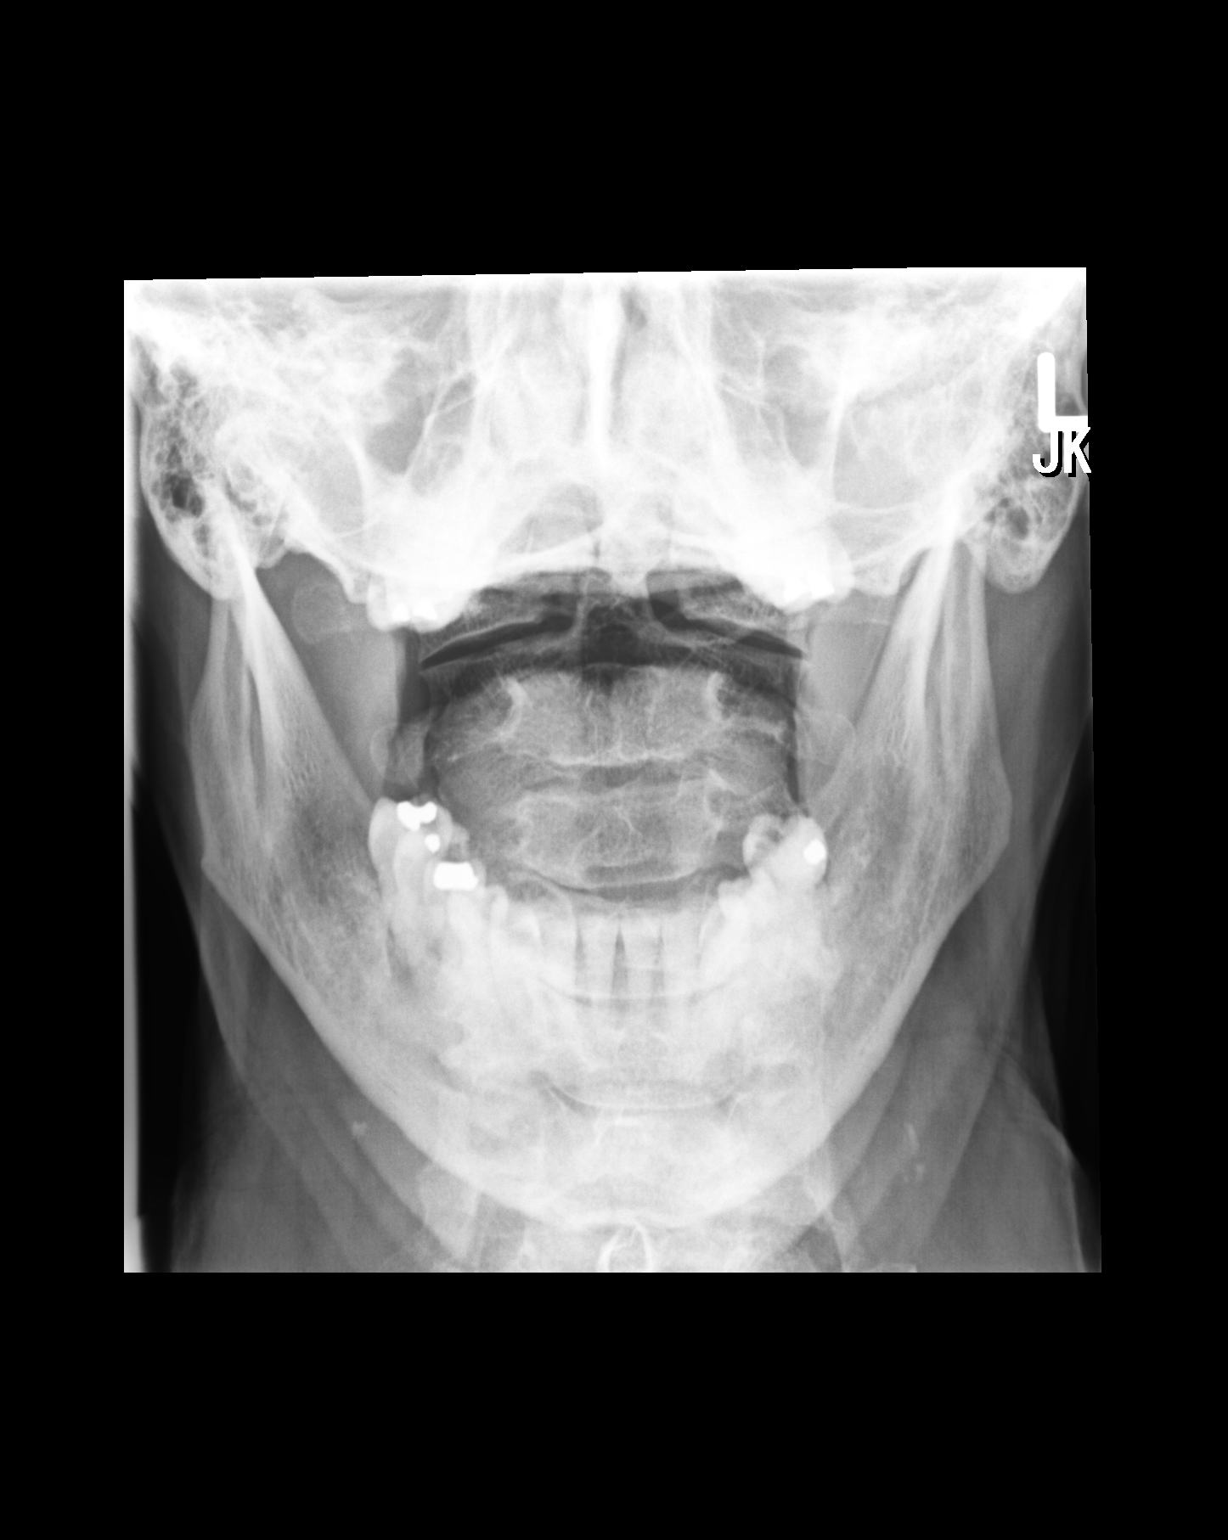

[3 of 3 positions shown; findings below may reference images not displayed]

FINDINGS: The lateral view is diagnostic to the T1 level. There is no acute
fracture or subluxation. Vertebral body heights are preserved.
Alignment is normal. Mild disc height loss from C2-C3 through C4-C5
and at C6-C7, similar to prior study. Anterior endplate spurring
throughout the cervical spine is not significantly changed.
Mild-to-moderate facet arthropathy, also similar to prior study.
Prior thyroidectomy.Normal prevertebral soft tissues. Carotid
atherosclerotic calcifications.
IMPRESSION: Mild degenerative disc disease and mild-to-moderate facet
arthropathy throughout the cervical spine, overall similar to prior
study.

## 2019-03-03 ENCOUNTER — Telehealth (HOSPITAL_COMMUNITY): Payer: Self-pay

## 2019-03-03 NOTE — Telephone Encounter (Signed)
Called to schedule f/u mra, no answer, left vm. AW 

## 2019-04-21 ENCOUNTER — Telehealth (HOSPITAL_COMMUNITY): Payer: Self-pay

## 2019-04-21 NOTE — Telephone Encounter (Signed)
Called to schedule f/u mra, no answer, left vm. AW 

## 2019-04-25 IMAGING — XA IR ANGIO VETEBRAL SEL VERTEBRAL BILAT MOD SED
1 series · 12 of 24 positions shown · IV contrast (IODINE)
Comparison: MRI MRA of the brain 04/16/2018.

CLINICAL DATA: Vertebrobasilar insufficiency on cervical extension.

EXAM:
BILATERAL COMMON CAROTID AND INNOMINATE ANGIOGRAPHY; IR ANGIO
VERTEBRAL SEL VERTEBRAL BILAT MOD SED
TECHNIQUE: Informed written consent was obtained from the patient after a
thorough discussion of the procedural risks, benefits and
alternatives. All questions were addressed. Maximal Sterile Barrier
Technique was utilized including caps, mask, sterile gowns, sterile
gloves, sterile drape, hand hygiene and skin antiseptic. A timeout
was performed prior to the initiation of the procedure.

[Series 300: dr. (person_name) · 12 of 227 slices shown]
[im 10/227]
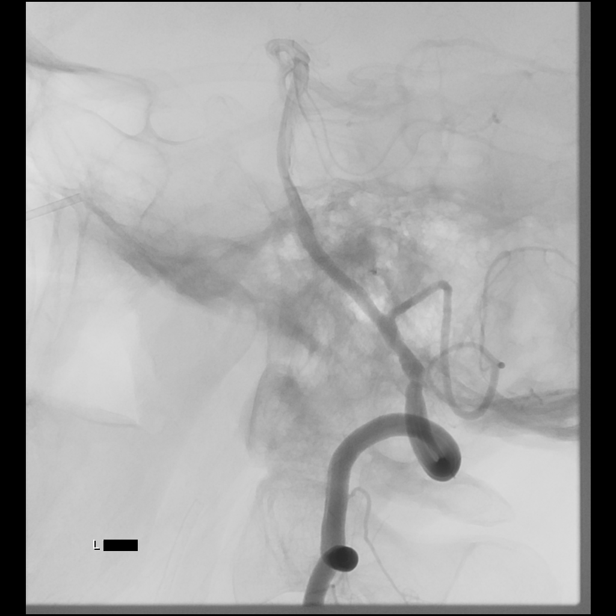
[im 30/227]
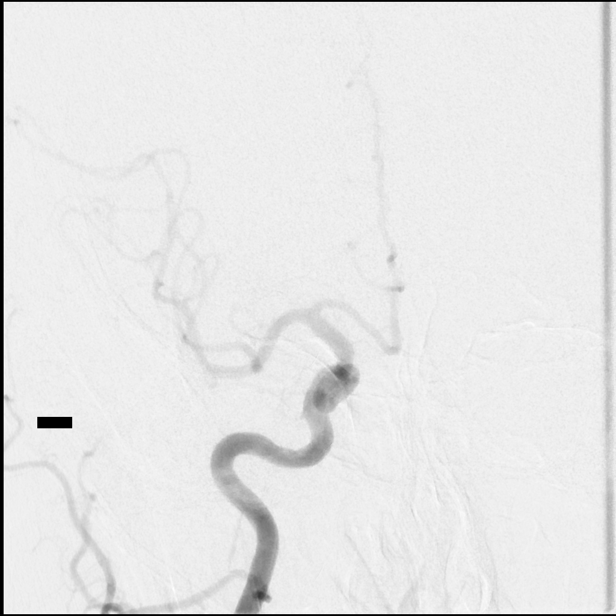
[im 50/227]
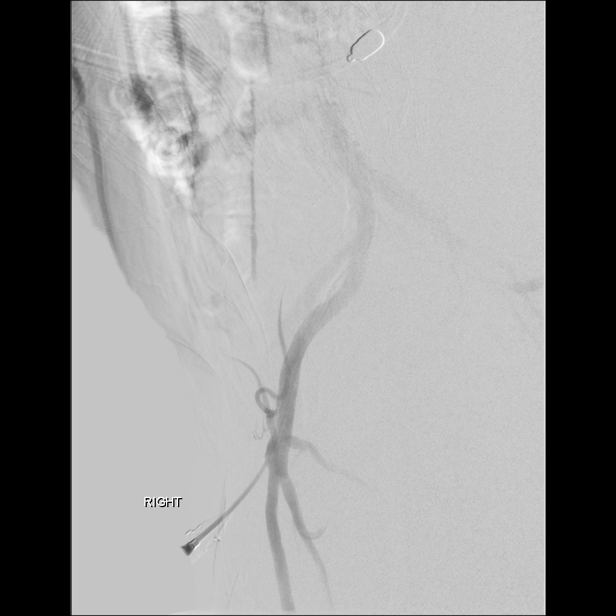
[im 69/227]
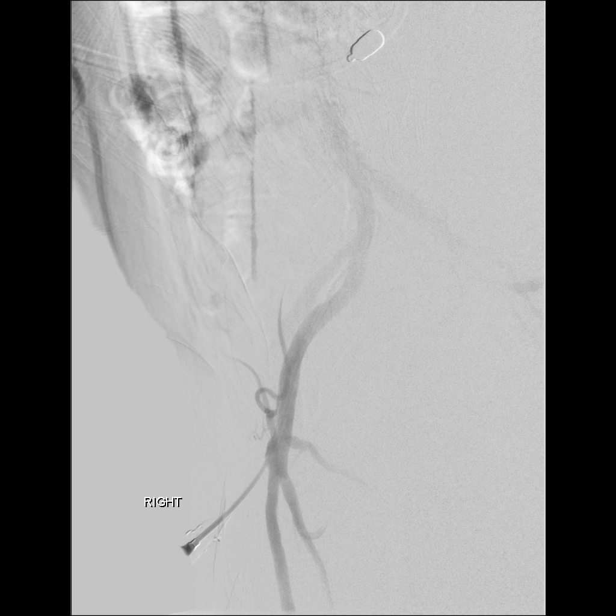
[im 89/227]
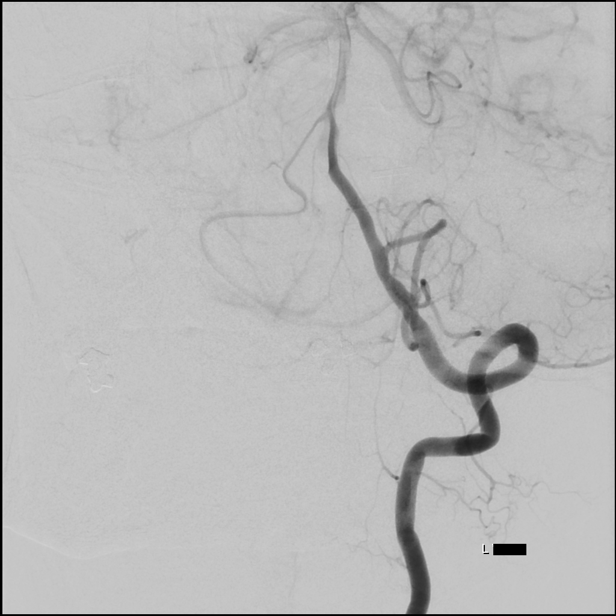
[im 109/227]
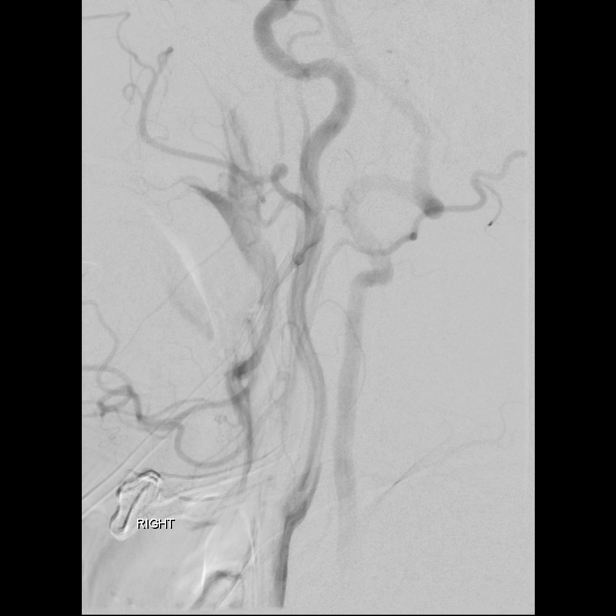
[im 128/227]
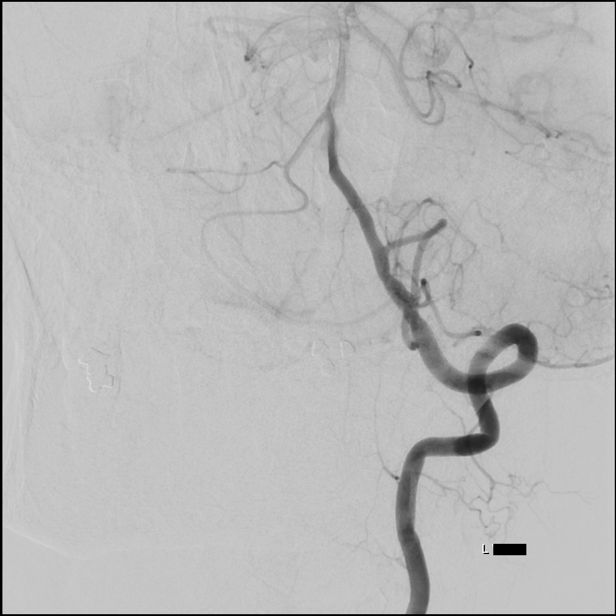
[im 148/227]
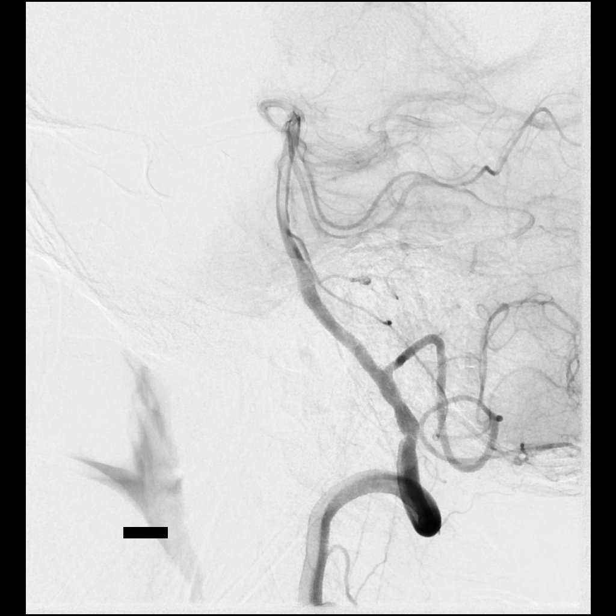
[im 168/227]
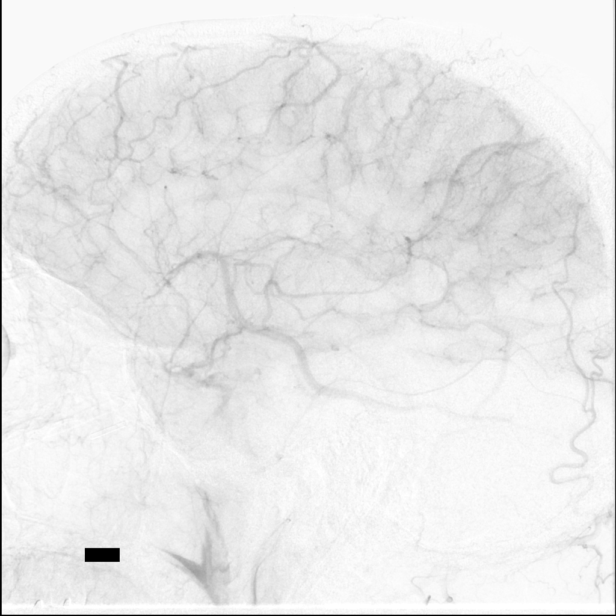
[im 187/227]
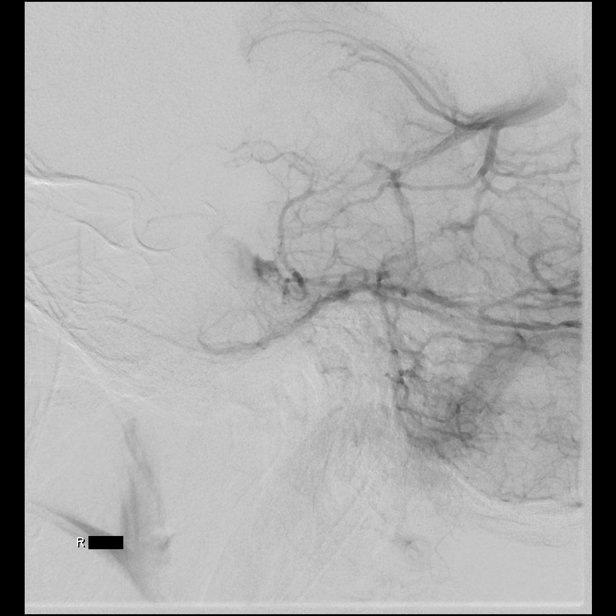
[im 207/227]
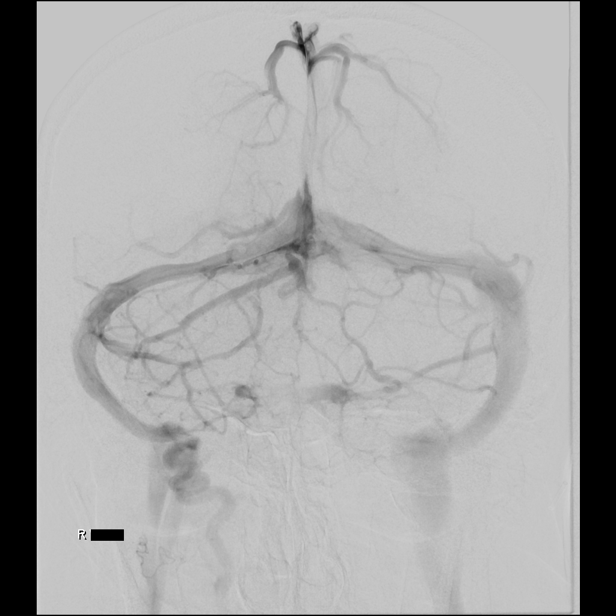
[im 227/227]
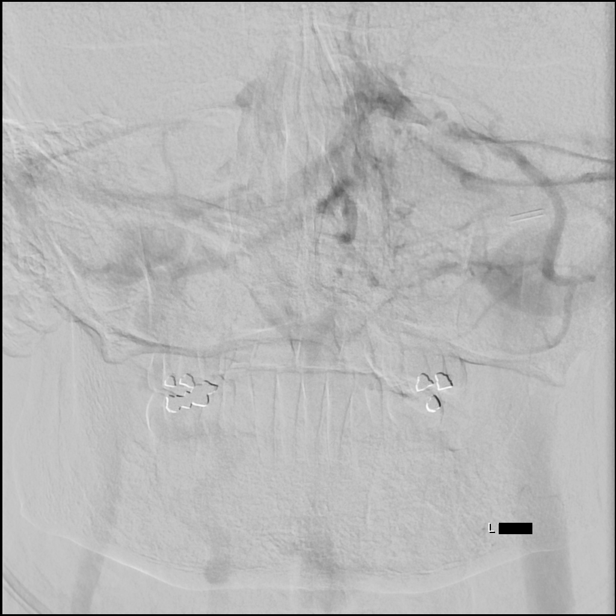

[12 of 24 positions shown; findings below may reference images not displayed]

MEDICATIONS:
Heparin 4777 units IV; no antibiotic was administered within 1 hour
of the procedure.

ANESTHESIA/SEDATION:
Versed 1 mg IV; Fentanyl 25 mcg IV

Moderate Sedation Time:  30 minutes

The patient was continuously monitored during the procedure by the
interventional radiology nurse under my direct supervision.

CONTRAST:  Isovue 300 approximately 55 mL

FLUOROSCOPY TIME:  Fluoroscopy Time: 8 minutes 30 seconds (953 mGy).

COMPLICATIONS:
None immediate.
The right groin was prepped and draped in the usual sterile fashion.
Thereafter using modified Seldinger technique, transfemoral access
into the right common femoral artery was obtained without
difficulty. Over a 0.035 inch guidewire, a 5 French Pinnacle sheath
was inserted. Through this, and also over 0.035 inch guidewire, a 5
French JB 1 catheter was advanced to the aortic arch region and
selectively positioned in the right common carotid artery, the right
vertebral artery, the left common carotid artery and the left
vertebral artery.
FINDINGS: The right common carotid arteriogram demonstrates the right external
carotid artery origin to be narrowed by 50%. Its branches are
normally opacified.

The right internal carotid artery at the bulb to the cranial skull
base demonstrates wide patency. The petrous, cavernous and
supraclinoid segments are widely patent.

The right middle cerebral artery and the right anterior cerebral
artery opacify into the capillary and venous phases.

There are mild focal areas of caliber irregularity involving the
pericallosal branches in the A3 region of the right anterior
cerebral artery.

The venous phase demonstrates a high-riding left internal jugular
bulb.

The right vertebral artery origin is widely patent.

The vessel opacifies normally to the cranial skull base. Wide
patency is seen of the right vertebrobasilar junction and the right
posterior-inferior cerebellar artery. There is mild caliber
irregularity just proximal to the right posterior-inferior
cerebellar artery.

More distally, the opacified portion of the basilar artery, the
posterior cerebral arteries, the superior cerebellar arteries and
the anterior-inferior cerebellar arteries is noted into the
capillary and venous phases.

There is bilateral duplication of the superior cerebellar arteries a
development variation.

The venous phase again demonstrates high riding left internal
jugular artery bulb. The right internal jugular vein in the
suboccipital region appears moderately narrowed. However,
suboccipital venous channels are noted in this region.

The left common carotid arteriogram demonstrates the left external
carotid artery and its major branches to be widely patent.

The left internal carotid artery at the bulb to the cranial skull
base also demonstrates wide patency. The petrous, cavernous and
supraclinoid segments are widely patent.

The left middle cerebral artery and the left anterior cerebral
artery opacify into the capillary and venous phases.

The venous phase demonstrates venous egress via the superior
sagittal sinus, and also via the ipsilateral vein of Labbe into the
left sigmoid sinus.

Again noted is a high-riding left jugular bulb.

The origin of the left vertebral artery is widely patent.

The vessel is seen to opacify to the cranial skull base.

The left vertebrobasilar junction proximal to the left
posterior-inferior cerebellar artery demonstrates approximately
65-70% stenosis on the AP projection, and a 50% stenosis on the
lateral projection.

Distal to this there is mild narrowing of the left vertebrobasilar
junction.

The opacified portion of the basilar artery, the posterior cerebral
arteries, the superior cerebellar arteries and the anterior-inferior
cerebellar arteries is normal into the capillary and venous phases.

Unopacified blood is seen in the basilar artery from the
contralateral vertebral artery.
IMPRESSION: Approximately 65% to 70% stenosis of the left vertebrobasilar
junction proximal to the left posterior-inferior cerebellar artery.

Mild atherosclerotic irregularity of the right vertebrobasilar
junction proximal to the right posterior-inferior cerebellar artery.

Approximately 50% stenosis of the origin of the right external
carotid artery.

High-riding left jugular bulb a developmental variation.

PLAN:
Discussed findings with the patient and his spouse. Follow-up with
MRA of the brain in 6 months to evaluate for progression of the left
vertebrobasilar stenosis as described above. Patient advised to
avoid hyperextension of his neck to avoid symptoms of dizziness
which apparently have been chronic as per patient and his spouse.

## 2019-06-05 ENCOUNTER — Telehealth (HOSPITAL_COMMUNITY): Payer: Self-pay

## 2019-06-05 NOTE — Telephone Encounter (Signed)
Called to schedule f/u mra, no answer, left vm. AW 

## 2019-08-26 ENCOUNTER — Other Ambulatory Visit: Payer: Self-pay | Admitting: Interventional Cardiology

## 2019-09-30 ENCOUNTER — Other Ambulatory Visit: Payer: Self-pay | Admitting: Interventional Cardiology

## 2019-10-20 ENCOUNTER — Other Ambulatory Visit: Payer: Self-pay | Admitting: Interventional Cardiology

## 2019-10-27 ENCOUNTER — Other Ambulatory Visit: Payer: Self-pay | Admitting: Interventional Cardiology

## 2019-10-27 ENCOUNTER — Telehealth: Payer: Self-pay | Admitting: *Deleted

## 2019-10-27 MED ORDER — ATORVASTATIN CALCIUM 40 MG PO TABS: 40.0000 mg | ORAL_TABLET | Freq: Every day | ORAL | 0 refills | Status: DC

## 2019-10-27 NOTE — Telephone Encounter (Signed)
Prescription sent to [facility].

## 2019-10-27 NOTE — Telephone Encounter (Signed)
Virtual Visit Pre-Appointment Phone Call  "(Name), I am calling you today to discuss your upcoming appointment. We are currently trying to limit exposure to the virus that causes COVID-19 by seeing patients at home rather than in the office."  1. "What is the BEST phone number to call the day of the visit?" - include this in appointment notes  2. "Do you have or have access to (through a family member/friend) a smartphone with video capability that we can use for your visit?" a. If yes - list this number in appt notes as "cell" (if different from BEST phone #) and list the appointment type as a VIDEO visit in appointment notes b. If no - list the appointment type as a PHONE visit in appointment notes  3. Confirm consent - "In the setting of the current Covid19 crisis, you are scheduled for a (phone or video) visit with your provider on (date) at (time).  Just as we do with many in-office visits, in order for you to participate in this visit, we must obtain consent.  If you'd like, I can send this to your mychart (if signed up) or email for you to review.  Otherwise, I can obtain your verbal consent now.  All virtual visits are billed to your insurance company just like a normal visit would be.  By agreeing to a virtual visit, we'd like you to understand that the technology does not allow for your provider to perform an examination, and thus may limit your provider's ability to fully assess your condition. If your provider identifies any concerns that need to be evaluated in person, we will make arrangements to do so.  Finally, though the technology is pretty good, we cannot assure that it will always work on either your or our end, and in the setting of a video visit, we may have to convert it to a phone-only visit.  In either situation, we cannot ensure that we have a secure connection.  Are you willing to proceed?" STAFF: Did the patient verbally acknowledge consent to telehealth visit? Document  YES/NO here: YES  4. Advise patient to be prepared - "Two hours prior to your appointment, go ahead and check your blood pressure, pulse, oxygen saturation, and your weight (if you have the equipment to check those) and write them all down. When your visit starts, your provider will ask you for this information. If you have an Apple Watch or Kardia device, please plan to have heart rate information ready on the day of your appointment. Please have a pen and paper handy nearby the day of the visit as well."  5. Give patient instructions for MyChart download to smartphone OR Doximity/Doxy.me as below if video visit (depending on what platform provider is using)  6. Inform patient they will receive a phone call 15 minutes prior to their appointment time (may be from unknown caller ID) so they should be prepared to answer    TELEPHONE CALL NOTE  [name] has been deemed a candidate for a follow-up tele-health visit to limit community exposure during the Covid-19 pandemic. I spoke with the patient via phone to ensure availability of phone/video source, confirm preferred email & phone number, and discuss instructions and expectations.  I reminded [name] to be prepared with any vital sign and/or heart rhythm information that could potentially be obtained via home monitoring, at the time of his visit. I reminded [name] to expect a phone call prior to  his visit.  [name] 10/27/2019 2:10 PM   INSTRUCTIONS FOR DOWNLOADING THE MYCHART APP TO SMARTPHONE  - The patient must first make sure to have activated MyChart and know their login information - If Apple, go to [employer] and type in MyChart in the search bar and download the app. If Android, ask patient to go to [employer] and type in [place] in the search bar and download the app. The app is free but as with any other app downloads, their phone may require them to verify saved payment information or  Apple/Android password.  - The patient will need to then log into the app with their MyChart username and password, and select [facility] as their healthcare provider to link the account. When it is time for your visit, go to the MyChart app, find appointments, and click Begin Video Visit. Be sure to Select Allow for your device to access the Microphone and Camera for your visit. You will then be connected, and your provider will be with you shortly.  **If they have any issues connecting, or need assistance please contact MyChart service desk [phone])**  **If using a computer, in order to ensure the best quality for their visit they will need to use either of the following Internet Browsers: [employer] or Google Chrome**  IF USING DOXIMITY or DOXY.ME - The patient will receive a link just prior to their visit by text.     FULL LENGTH CONSENT FOR TELE-HEALTH VISIT   I hereby voluntarily request, consent and authorize [facility] and its employed or contracted physicians, physician assistants, nurse practitioners or other licensed health care professionals (the Practitioner), to provide me with telemedicine health care services (the "Services") as deemed necessary by the treating Practitioner. I acknowledge and consent to receive the Services by the Practitioner via telemedicine. I understand that the telemedicine visit will involve communicating with the Practitioner through live audiovisual communication technology and the disclosure of certain medical information by electronic transmission. I acknowledge that I have been given the opportunity to request an in-person assessment or other available alternative prior to the telemedicine visit and am voluntarily participating in the telemedicine visit.  I understand that I have the right to withhold or withdraw my consent to the use of telemedicine in the course of my care at any time, without affecting my right to future care  or treatment, and that the Practitioner or I may terminate the telemedicine visit at any time. I understand that I have the right to inspect all information obtained and/or recorded in the course of the telemedicine visit and may receive copies of available information for a reasonable fee.  I understand that some of the potential risks of receiving the Services via telemedicine include:  Marland Kitchen Delay or interruption in medical evaluation due to technological equipment failure or disruption; . Information transmitted may not be sufficient (e.g. poor resolution of images) to allow for appropriate medical decision making by the Practitioner; and/or  . In rare instances, security protocols could fail, causing a breach of personal health information.  Furthermore, I acknowledge that it is my responsibility to provide information about my medical history, conditions and care that is complete and accurate to the best of my ability. I acknowledge that Practitioner's advice, recommendations, and/or decision may be based on factors not within their control, such as incomplete or inaccurate data provided by me or distortions of diagnostic images or specimens that may result from electronic transmissions. I  understand that the practice of medicine is not an exact science and that Practitioner makes no warranties or guarantees regarding treatment outcomes. I acknowledge that I will receive a copy of this consent concurrently upon execution via email to the email address I last provided but may also request a printed copy by calling the office of [facility].    I understand that my insurance will be billed for this visit.   I have read or had this consent read to me. . I understand the contents of this consent, which adequately explains the benefits and risks of the Services being provided via telemedicine.  . I have been provided ample opportunity to ask questions regarding this consent and the Services and have had  my questions answered to my satisfaction. . I give my informed consent for the services to be provided through the use of telemedicine in my medical care  By participating in this telemedicine visit I agree to the above.

## 2019-10-27 NOTE — Telephone Encounter (Signed)
*  STAT* If patient is at the pharmacy, call can be transferred to refill team.   1. Which medications need to be refilled? (please list name of each medication and dose if known) atorvastatin (LIPITOR) 40 MG tablet  2. Which pharmacy/location (including street and city if local pharmacy) is medication to be sent to? [facility] -  ([place]), [place] - [address]  3. Do they need a 30 day or 90 day supply? 90 day supply  Patient has been out of medication for a week

## 2019-10-29 NOTE — Progress Notes (Signed)
Telehealth Visit     Virtual Visit via Video Note   This visit type was conducted due to national recommendations for restrictions regarding the COVID-19 Pandemic (e.g. social distancing) in an effort to limit this patient's exposure and mitigate transmission in our community.  Due to his co-morbid illnesses, this patient is at least at moderate risk for complications without adequate follow up.  This format is felt to be most appropriate for this patient at this time.  All issues noted in this document were discussed and addressed.  A limited physical exam was performed with this format.  Please refer to the patient's chart for his consent to telehealth for [facility].   Evaluation Performed:  Follow-up visit  This visit type was conducted due to national recommendations for restrictions regarding the COVID-19 Pandemic (e.g. social distancing).  This format is felt to be most appropriate for this patient at this time.  All issues noted in this document were discussed and addressed.  No physical exam was performed (except for noted visual exam findings with Video Visits).  Please refer to the patient's chart (MyChart message for video visits and phone note for telephone visits) for the patient's consent to telehealth for [facility].  Date:  11/04/2019   ID:  [name], DOB [birth year], MRN [id number]  Patient Location:  Home  Provider location:   Home  PCP:  Merlene Laughter, MD  Cardiologist:  Glade Stanford, MD  Electrophysiologist:  None   Chief Complaint:  Follow up visit.   History of Present Illness:    [name] is a 59 y.o. male who presents via audio/video conferencing for a telehealth visit today.  Seen for Dr. Katrinka Blazing.   He has a history of known CAD - non obstructive by cath from 2019, HLD, HTN, tobacco abuse, bipolar disorder and Barrett's esophagus.   Last seen in September of 2019 by Dr. Katrinka Blazing following his cath from July of 2019 and was doing  ok.   Unknown if the patient has symptoms concerning for COVID-19 infection (fever, chills, cough, or new shortness of breath).   Attempted telehealth visit today. Patient did not answer the telephone - mobile voicemail full and land line not accepting numbers. Multiple attempts made. Would advise patient have in office visit arranged.    Past Medical History:  Diagnosis Date  . Anginal pain (HCC) 2000   takes NTG prn  . Arthritis    neck and knee  . Barrett's esophagus   . Bipolar 1 disorder (HCC)   . Cervical disc syndrome   . Chest pain 10/04/2011  . Depression   . GERD (gastroesophageal reflux disease)   . Grave's disease   . Graves' disease 10/04/2011  . H/O prostatitis   . Hiatal hernia   . HTN (hypertension) 10/05/2011  . Hyperlipidemia   . Hypertension   . Lumbar degenerative disc disease   . Mixed hyperlipidemia 12/27/2017  . Tobacco abuse 12/27/2017   Past Surgical History:  Procedure Laterality Date  . CARDIAC CATHETERIZATION  2000  . CHOLECYSTECTOMY  12/11/2013   DR Andrey Campanile   . CHOLECYSTECTOMY N/A 12/11/2013   Procedure: LAPAROSCOPIC CHOLECYSTECTOMY WITH INTRAOPERATIVE CHOLANGIOGRAM;  Surgeon: Atilano Ina, MD;  Location: [facility] OR;  Service: General;  Laterality: N/A;  . IR ANGIO INTRA EXTRACRAN SEL COM CAROTID INNOMINATE BILAT MOD SED  05/02/2018  . IR ANGIO VERTEBRAL SEL VERTEBRAL BILAT MOD SED  05/02/2018  . KNEE SURGERY    . LEFT  HEART CATH AND CORONARY ANGIOGRAPHY N/A 03/12/2018   Procedure: LEFT HEART CATH AND CORONARY ANGIOGRAPHY;  Surgeon: Belva Crome, MD;  Location: [place] [facility];  Service: Cardiovascular;  Laterality: N/A;  . testicular torsion surgery    . THYROIDECTOMY       No outpatient medications have been marked as taking for the 11/04/19 encounter (Telemedicine) with Burtis Junes, NP.     Allergies:   Bee venom   Social History   Tobacco Use  . Smoking status: Former Smoker    Packs/day: 0.25    Years: 35.00    Pack years: 8.75     Types: Cigarettes    Quit date: 01/28/2015    Years since quitting: 4.7  . Smokeless tobacco: Never Used  Substance Use Topics  . Alcohol use: No  . Drug use: No     Family Hx: The patient's family history includes Cancer in an other family member; Hypertension in his father, mother, and another family member; Valvular heart disease in his father.  ROS:   Please see the history of present illness.   All other systems reviewed are negative.    Objective:    Vital Signs:  There were no vitals taken for this visit.   Wt Readings from Last 3 Encounters:  05/29/18 179 lb 6.4 oz (81.4 kg)  05/02/18 173 lb (78.5 kg)  04/08/18 173 lb (78.5 kg)       Labs/Other Tests and Data Reviewed:    Lab Results  Component Value Date   WBC 11.0 (H) 05/02/2018   HGB 13.6 05/02/2018   HCT 40.6 05/02/2018   PLT 237 05/02/2018   GLUCOSE 109 (H) 05/02/2018   CHOL 134 04/10/2018   TRIG 111 04/10/2018   HDL 53 04/10/2018   LDLCALC 59 04/10/2018   ALT 54 (H) 04/10/2018   AST 30 04/10/2018   NA 140 05/02/2018   K 4.1 05/02/2018   CL 106 05/02/2018   CREATININE 0.94 05/02/2018   BUN 13 05/02/2018   CO2 26 05/02/2018   TSH 18.560 (H) 10/05/2011   INR 0.95 05/02/2018   HGBA1C 5.6 04/08/2018        BNP (last 3 results) No results for input(s): BNP in the last 8760 hours.  ProBNP (last 3 results) No results for input(s): PROBNP in the last 8760 hours.    Prior CV studies:    The following studies were reviewed today:  2D Doppler echocardiogram 01/02/2018: Study Conclusions  - Left ventricle: The cavity size was normal. Wall thickness was normal. Systolic function was normal. The estimated ejection fraction was in the range of 55% to 60%. Wall motion was normal; there were no regional wall motion abnormalities. Left ventricular diastolic function parameters were normal. - Aortic valve: Trileaflet; mildly calcified leaflets. Sclerosis without stenosis. Mean gradient  (S): 9 mm Hg. - Mitral valve: There was trivial regurgitation. - Left atrium: The atrium was mildly dilated. - Right ventricle: The cavity size was normal. Systolic function was normal. - Tricuspid valve: Peak RV-RA gradient (S): 30 mm Hg. - Pulmonary arteries: PA peak pressure: 33 mm Hg (S). - Inferior vena cava: The vessel was normal in size. The respirophasic diameter changes were in the normal range (>= 50%), consistent with normal central venous pressure.  Impressions:  - Normal LV size with EF 55-60%. Normal diastolic function. Normal RV size and systolic function. Aortic valve sclerosis without significant stenosis.  Cardiac catheterization 03/12/2018: Diagnostic Diagram      LEFT  HEART CATH AND CORONARY ANGIOGRAPHY 03/2018  Conclusion   Three-vessel mild to moderate nonobstructive coronary disease with 50 to 60% distal circumflex, 50 to 60% proximal first diagonal, 20 to 30% proximal to distal RCA, and luminal irregularities in the proximal to distal LAD.  Normal left ventricular size and function.  Estimated ejection fraction 65%.  Acticin coronary calcium score greater than 400, documented by previous coronary CTA  RECOMMENDATIONS:   Aggressive risk factor modification: LDL less than 70 and perhaps pushing to 50, < blood pressure 130/80 mmHg, monitor hemoglobin A1c and maintain less than 7, aspirin 81 mg/day, and 150 minutes of moderate aerobic activity per week        ASSESSMENT & PLAN:    1. CAD  2. HTN  3. HLD  4. PAD - carotid disease - 1 to 39% per study from 03/2018 noted -  5. Tobacco abuse  6. Aortic valve sclerosis -   7. COVID-19 Education:   Patient Risk:   After full review of this patient's clinical status, I feel that they are at least moderate risk at this time.  Time:   Today, I have spent 0 minutes with the patient with telehealth technology discussing the above issues.     Medication Adjustments/Labs and  Tests Ordered: Current medicines are reviewed at length with the patient today.  Concerns regarding medicines are outlined above.   Tests Ordered: No orders of the defined types were placed in this encounter.   Medication Changes: No orders of the defined types were placed in this encounter.   Disposition:  Needs in office visit with EKG   Patient is agreeable to this plan and will call if any problems develop in the interim.   Avelina Laine, NP  11/04/2019 7:44 AM    [facility]

## 2019-11-04 ENCOUNTER — Encounter: Payer: Self-pay | Admitting: Nurse Practitioner

## 2019-11-04 ENCOUNTER — Telehealth (INDEPENDENT_AMBULATORY_CARE_PROVIDER_SITE_OTHER): Payer: Medicare Other | Admitting: Nurse Practitioner

## 2019-11-04 ENCOUNTER — Telehealth: Payer: Self-pay | Admitting: *Deleted

## 2019-11-04 ENCOUNTER — Other Ambulatory Visit: Payer: Self-pay

## 2019-11-04 DIAGNOSIS — F319 Bipolar disorder, unspecified: Secondary | ICD-10-CM

## 2019-11-04 DIAGNOSIS — I2510 Atherosclerotic heart disease of native coronary artery without angina pectoris: Secondary | ICD-10-CM

## 2019-11-04 DIAGNOSIS — Z7189 Other specified counseling: Secondary | ICD-10-CM

## 2019-11-04 DIAGNOSIS — I10 Essential (primary) hypertension: Secondary | ICD-10-CM

## 2019-11-04 DIAGNOSIS — Z720 Tobacco use: Secondary | ICD-10-CM

## 2019-11-04 DIAGNOSIS — E782 Mixed hyperlipidemia: Secondary | ICD-10-CM

## 2019-11-04 NOTE — Telephone Encounter (Signed)
Tried to call pt X 3 on home phone number, the number does not except blocked calls.  Tried to call cell phone X 3 vm box is full.  Theses calls were for a Virtual Visit today.

## 2019-11-04 NOTE — Patient Instructions (Signed)
After Visit Summary:  We will be checking the following labs today -    Medication Instructions:    Continue with your current medicines.    If you need a refill on your cardiac medications before your next appointment, please call your pharmacy.     Testing/Procedures To Be Arranged:  N/A  Follow-Up:   See     At [facility], you and your health needs are our priority.  As part of our continuing mission to provide you with exceptional heart care, we have created designated Provider Care Teams.  These Care Teams include your primary Cardiologist (physician) and Advanced Practice Providers (APPs -  Physician Assistants and Nurse Practitioners) who all work together to provide you with the care you need, when you need it.  Special Instructions:  . Stay safe, stay home, wash your hands for at least 20 seconds and wear a mask when out in public.  . It was good to talk with you today.    Call the [facility] office at [phone] if you have any questions, problems or concerns.       

## 2020-01-31 ENCOUNTER — Other Ambulatory Visit: Payer: Self-pay | Admitting: Interventional Cardiology

## 2020-10-01 DIAGNOSIS — I10 Essential (primary) hypertension: Secondary | ICD-10-CM | POA: Diagnosis not present

## 2020-10-01 DIAGNOSIS — E039 Hypothyroidism, unspecified: Secondary | ICD-10-CM | POA: Diagnosis not present

## 2020-10-01 DIAGNOSIS — E781 Pure hyperglyceridemia: Secondary | ICD-10-CM | POA: Diagnosis not present

## 2020-10-01 DIAGNOSIS — I209 Angina pectoris, unspecified: Secondary | ICD-10-CM | POA: Diagnosis not present

## 2020-10-12 ENCOUNTER — Other Ambulatory Visit: Payer: Self-pay | Admitting: Geriatric Medicine

## 2020-10-12 DIAGNOSIS — E781 Pure hyperglyceridemia: Secondary | ICD-10-CM | POA: Diagnosis not present

## 2020-10-12 DIAGNOSIS — F17210 Nicotine dependence, cigarettes, uncomplicated: Secondary | ICD-10-CM | POA: Diagnosis not present

## 2020-10-12 DIAGNOSIS — Z1389 Encounter for screening for other disorder: Secondary | ICD-10-CM | POA: Diagnosis not present

## 2020-10-12 DIAGNOSIS — Z0000 Encounter for general adult medical examination without abnormal findings: Secondary | ICD-10-CM | POA: Diagnosis not present

## 2020-10-12 DIAGNOSIS — K760 Fatty (change of) liver, not elsewhere classified: Secondary | ICD-10-CM | POA: Diagnosis not present

## 2020-10-12 DIAGNOSIS — I10 Essential (primary) hypertension: Secondary | ICD-10-CM | POA: Diagnosis not present

## 2020-10-12 DIAGNOSIS — K909 Intestinal malabsorption, unspecified: Secondary | ICD-10-CM | POA: Diagnosis not present

## 2020-10-12 DIAGNOSIS — K2270 Barrett's esophagus without dysplasia: Secondary | ICD-10-CM | POA: Diagnosis not present

## 2020-10-12 DIAGNOSIS — E039 Hypothyroidism, unspecified: Secondary | ICD-10-CM | POA: Diagnosis not present

## 2020-10-12 DIAGNOSIS — Z79899 Other long term (current) drug therapy: Secondary | ICD-10-CM | POA: Diagnosis not present

## 2020-10-12 DIAGNOSIS — E0500 Thyrotoxicosis with diffuse goiter without thyrotoxic crisis or storm: Secondary | ICD-10-CM | POA: Diagnosis not present

## 2020-10-28 ENCOUNTER — Ambulatory Visit
Admission: RE | Admit: 2020-10-28 | Discharge: 2020-10-28 | Disposition: A | Discharge status: Home / Self Care | Payer: Medicare Other | Source: Ambulatory Visit | Attending: Geriatric Medicine | Admitting: Geriatric Medicine

## 2020-10-28 ENCOUNTER — Other Ambulatory Visit: Payer: Self-pay

## 2020-10-28 DIAGNOSIS — F17210 Nicotine dependence, cigarettes, uncomplicated: Secondary | ICD-10-CM

## 2020-10-28 DIAGNOSIS — J432 Centrilobular emphysema: Secondary | ICD-10-CM | POA: Diagnosis not present

## 2020-10-28 DIAGNOSIS — E890 Postprocedural hypothyroidism: Secondary | ICD-10-CM | POA: Diagnosis not present

## 2020-10-28 DIAGNOSIS — I2510 Atherosclerotic heart disease of native coronary artery without angina pectoris: Secondary | ICD-10-CM | POA: Diagnosis not present

## 2020-11-02 DIAGNOSIS — M542 Cervicalgia: Secondary | ICD-10-CM | POA: Diagnosis not present

## 2020-11-20 ENCOUNTER — Other Ambulatory Visit: Payer: Self-pay | Admitting: Interventional Cardiology

## 2020-11-24 DIAGNOSIS — I10 Essential (primary) hypertension: Secondary | ICD-10-CM | POA: Diagnosis not present

## 2020-11-24 DIAGNOSIS — E781 Pure hyperglyceridemia: Secondary | ICD-10-CM | POA: Diagnosis not present

## 2020-11-24 DIAGNOSIS — I209 Angina pectoris, unspecified: Secondary | ICD-10-CM | POA: Diagnosis not present

## 2020-11-24 DIAGNOSIS — E039 Hypothyroidism, unspecified: Secondary | ICD-10-CM | POA: Diagnosis not present

## 2020-12-07 DIAGNOSIS — G5601 Carpal tunnel syndrome, right upper limb: Secondary | ICD-10-CM | POA: Diagnosis not present

## 2020-12-31 ENCOUNTER — Other Ambulatory Visit: Payer: Self-pay | Admitting: Interventional Cardiology

## 2021-01-05 DIAGNOSIS — I10 Essential (primary) hypertension: Secondary | ICD-10-CM | POA: Diagnosis not present

## 2021-01-05 DIAGNOSIS — I209 Angina pectoris, unspecified: Secondary | ICD-10-CM | POA: Diagnosis not present

## 2021-01-05 DIAGNOSIS — E781 Pure hyperglyceridemia: Secondary | ICD-10-CM | POA: Diagnosis not present

## 2021-01-05 DIAGNOSIS — E039 Hypothyroidism, unspecified: Secondary | ICD-10-CM | POA: Diagnosis not present

## 2021-01-14 ENCOUNTER — Other Ambulatory Visit: Payer: Self-pay | Admitting: Interventional Cardiology

## 2021-01-17 DIAGNOSIS — G5602 Carpal tunnel syndrome, left upper limb: Secondary | ICD-10-CM | POA: Diagnosis not present

## 2021-01-17 DIAGNOSIS — G5603 Carpal tunnel syndrome, bilateral upper limbs: Secondary | ICD-10-CM | POA: Diagnosis not present

## 2021-01-30 ENCOUNTER — Other Ambulatory Visit: Payer: Self-pay | Admitting: Interventional Cardiology

## 2021-02-21 ENCOUNTER — Other Ambulatory Visit: Payer: Self-pay | Admitting: Interventional Cardiology

## 2021-03-08 ENCOUNTER — Other Ambulatory Visit: Payer: Self-pay

## 2021-03-08 ENCOUNTER — Emergency Department (HOSPITAL_COMMUNITY)
Admission: EM | Admit: 2021-03-08 | Discharge: 2021-03-08 | Disposition: A | Discharge status: Home / Self Care | Payer: Medicare Other | Attending: Emergency Medicine | Admitting: Emergency Medicine

## 2021-03-08 DIAGNOSIS — T63441A Toxic effect of venom of bees, accidental (unintentional), initial encounter: Secondary | ICD-10-CM | POA: Diagnosis not present

## 2021-03-08 DIAGNOSIS — Z87891 Personal history of nicotine dependence: Secondary | ICD-10-CM | POA: Diagnosis not present

## 2021-03-08 DIAGNOSIS — I10 Essential (primary) hypertension: Secondary | ICD-10-CM | POA: Insufficient documentation

## 2021-03-08 DIAGNOSIS — T63481A Toxic effect of venom of other arthropod, accidental (unintentional), initial encounter: Secondary | ICD-10-CM

## 2021-03-08 DIAGNOSIS — I2510 Atherosclerotic heart disease of native coronary artery without angina pectoris: Secondary | ICD-10-CM | POA: Insufficient documentation

## 2021-03-08 MED ORDER — PREDNISONE 20 MG PO TABS: 20.0000 mg | ORAL_TABLET | Freq: Every day | ORAL | 0 refills | Status: AC

## 2021-03-08 MED ORDER — EPINEPHRINE 0.3 MG/0.3ML IJ SOAJ: 0.3000 mg | INTRAMUSCULAR | 0 refills | Status: DC | PRN

## 2021-03-08 MED ORDER — METHYLPREDNISOLONE SODIUM SUCC 125 MG IJ SOLR
125.0000 mg | Freq: Once | INTRAMUSCULAR | Status: AC
  Administered 2021-03-08: 125 mg via INTRAVENOUS
  Filled 2021-03-08: qty 2

## 2021-03-08 MED ORDER — SODIUM CHLORIDE 0.9 % IV BOLUS
500.0000 mL | Freq: Once | INTRAVENOUS | Status: AC
  Administered 2021-03-08: 500 mL via INTRAVENOUS

## 2021-03-08 MED ORDER — DIPHENHYDRAMINE HCL 50 MG/ML IJ SOLN
50.0000 mg | Freq: Once | INTRAMUSCULAR | Status: AC
  Administered 2021-03-08: 50 mg via INTRAVENOUS
  Filled 2021-03-08: qty 1

## 2021-03-08 MED ORDER — FAMOTIDINE IN NACL 20-0.9 MG/50ML-% IV SOLN
20.0000 mg | Freq: Once | INTRAVENOUS | Status: AC
  Administered 2021-03-08: 20 mg via INTRAVENOUS
  Filled 2021-03-08: qty 50

## 2021-03-08 NOTE — ED Notes (Signed)
RN reviewed discharge instructions. Follow up and prescriptions reviewed, pt had no further questions.

## 2021-03-08 NOTE — ED Notes (Signed)
Patient given soda. Resting comfortably on monitor.

## 2021-03-08 NOTE — ED Provider Notes (Signed)
Emergency Department Provider Note   I have reviewed the triage vital signs and the nursing notes.   HISTORY  Chief Complaint No chief complaint on file.   HPI [name] is a 60 y.o. male with past medical history reviewed below presents to the emergency department after being stung by a bee.  Patient has had anaphylactic reactions in the past related to bee stings requiring epinephrine.  He did not have his EpiPen at home.  To the sting occurred 1 hour prior to ED presentation.  He has some mild arm soreness.  He states that overall his symptoms are relatively mild but given his history he came in for evaluation.  He has some tingling feeling in the lips and has been feeling some mild shortness of breath but denies any tongue or lip swelling.  No hives.  No GI symptoms such as nausea, vomiting, diarrhea.  Denies chest pain.  He has not taken any medications prior to arrival.  He states that compared to prior events this is very mild.    Past Medical History:  Diagnosis Date   Anginal pain (HCC) 2000   takes NTG prn   Arthritis    neck and knee   Barrett's esophagus    Bipolar 1 disorder (HCC)    Cervical disc syndrome    Chest pain 10/04/2011   Depression    GERD (gastroesophageal reflux disease)    Grave's disease    Graves' disease 10/04/2011   H/O prostatitis    Hiatal hernia    HTN (hypertension) 10/05/2011   Hyperlipidemia    Hypertension    Lumbar degenerative disc disease    Mixed hyperlipidemia 12/27/2017   Tobacco abuse 12/27/2017    Patient Active Problem List   Diagnosis Date Noted   Aortic valve stenosis 05/29/2018   Vertebrobasilar insufficiency 04/08/2018   Syncope 03/26/2018   CAD (coronary artery disease) 03/25/2018   Stenosis of right subclavian artery (HCC) 03/20/2018   Coronary artery calcification seen on CAT scan 03/12/2018   Bilateral carotid bruits 02/27/2018   Mixed hyperlipidemia 12/27/2017   Tobacco abuse 12/27/2017   HTN  (hypertension) 10/05/2011   Chest pain 10/04/2011   Bipolar 1 disorder (HCC) 10/04/2011   Graves' disease 10/04/2011    Past Surgical History:  Procedure Laterality Date   CARDIAC CATHETERIZATION  2000   CHOLECYSTECTOMY  12/11/2013   DR Andrey Campanile    CHOLECYSTECTOMY N/A 12/11/2013   Procedure: LAPAROSCOPIC CHOLECYSTECTOMY WITH INTRAOPERATIVE CHOLANGIOGRAM;  Surgeon: Atilano Ina, MD;  Location: [facility] OR;  Service: General;  Laterality: N/A;   IR ANGIO INTRA EXTRACRAN SEL COM CAROTID INNOMINATE BILAT MOD SED  05/02/2018   IR ANGIO VERTEBRAL SEL VERTEBRAL BILAT MOD SED  05/02/2018   KNEE SURGERY     LEFT HEART CATH AND CORONARY ANGIOGRAPHY N/A 03/12/2018   Procedure: LEFT HEART CATH AND CORONARY ANGIOGRAPHY;  Surgeon: Lyn Records, MD;  Location: [facility] INVASIVE [facility];  Service: Cardiovascular;  Laterality: N/A;   testicular torsion surgery     THYROIDECTOMY      Allergies Bee venom  Family History  Problem Relation Age of Onset   Hypertension Mother    Hypertension Father    Valvular heart disease Father    Cancer Other    Hypertension Other     Social History Social History   Tobacco Use   Smoking status: Former    Packs/day: 0.25    Years: 35.00    Pack years: 8.75  Types: Cigarettes    Quit date: 01/28/2015    Years since quitting: 6.1   Smokeless tobacco: Never  Vaping Use   Vaping Use: Never used  Substance Use Topics   Alcohol use: No   Drug use: No    Review of Systems  Constitutional: No fever/chills Eyes: No visual changes. ENT: No sore throat. Positive lip tingling.  Cardiovascular: Denies chest pain. Respiratory: Mild shortness of breath. Gastrointestinal: No abdominal pain.  No nausea, no vomiting.  No diarrhea.  No constipation. Genitourinary: Negative for dysuria. Musculoskeletal: Negative for back pain. Skin: Negative for rash. Arm pain from sting.  Neurological: Negative for headaches, focal weakness or numbness.  10-point ROS otherwise  negative.  ____________________________________________   PHYSICAL EXAM:  VITAL SIGNS: ED Triage Vitals  Enc Vitals Group     BP 03/08/21 0856 (!) 141/82     Pulse Rate 03/08/21 0856 72     Resp 03/08/21 0856 20     Temp 03/08/21 0856 98.4 F (36.9 C)     Temp Source 03/08/21 0856 Oral     SpO2 03/08/21 0856 99 %   Constitutional: Alert and oriented. Well appearing and in no acute distress. Eyes: Conjunctivae are normal.  Head: Atraumatic. Nose: No congestion/rhinnorhea. Mouth/Throat: Mucous membranes are moist.  Oropharynx non-erythematous. Widely patent oropharynx. No lip/tongue swelling.  Neck: No stridor.  Cardiovascular: Normal rate, regular rhythm. Good peripheral circulation. Grossly normal heart sounds.   Respiratory: Normal respiratory effort.  No retractions. Lungs CTAB. Gastrointestinal: Soft and nontender. No distention.  Musculoskeletal: No lower extremity tenderness nor edema. No gross deformities of extremities. Neurologic:  Normal speech and language. No gross focal neurologic deficits are appreciated.  Skin:  Skin is warm, dry and intact. No rash noted.  ____________________________________________  RADIOLOGY  None   ____________________________________________   PROCEDURES  Procedure(s) performed:   Procedures  None  ____________________________________________   INITIAL IMPRESSION / ASSESSMENT AND PLAN / ED COURSE  Pertinent labs & imaging results that were available during my care of the patient were reviewed by me and considered in my medical decision making (see chart for details).   Patient presents to the emergency department with mild symptoms after a bee sting.  This has required epinephrine for anaphylaxis in the past.  Patient looks very well and describes the symptoms as mild at this time.  Plan for steroid, Benadryl, Pepcid and close monitoring in the emergency department.  I do not see indication for epinephrine at this time. Will  monitor closely and administer with any worsening symptoms.   11:20 AM  Patient reevaluated.  He is feeling well with no significant symptoms.  Mild inflammation around the sting site but no other rash or complications.  I have called in medications to the pharmacy and will discharge the patient.  ____________________________________________  FINAL CLINICAL IMPRESSION(S) / ED DIAGNOSES  Final diagnoses:  Insect stings, accidental or unintentional, initial encounter     MEDICATIONS GIVEN DURING THIS VISIT:  Medications  sodium chloride 0.9 % bolus 500 mL (0 mLs Intravenous Stopped 03/08/21 1055)  diphenhydrAMINE (BENADRYL) injection 50 mg (50 mg Intravenous Given 03/08/21 0933)  methylPREDNISolone sodium succinate (SOLU-MEDROL) 125 mg/2 mL injection 125 mg (125 mg Intravenous Given 03/08/21 0931)  famotidine (PEPCID) IVPB 20 mg premix (0 mg Intravenous Stopped 03/08/21 1005)     NEW OUTPATIENT MEDICATIONS STARTED DURING THIS VISIT:  New Prescriptions   EPINEPHRINE 0.3 MG/0.3 ML IJ SOAJ INJECTION    Inject 0.3 mg into the muscle as  needed for anaphylaxis.   PREDNISONE (DELTASONE) 20 MG TABLET    Take 1 tablet (20 mg total) by mouth daily for 3 days.    Note:  This document was prepared using Dragon voice recognition software and may include unintentional dictation errors.  Alona Bene, MD, [facility]    Chauntel Windsor, Arlyss Repress, MD 03/08/21 1126

## 2021-03-08 NOTE — Discharge Instructions (Signed)

## 2021-03-08 NOTE — ED Triage Notes (Signed)
Pt reports being stung by a bee on L arm about one hour PTA. Hx of severe allergic reaction to same. Endorses mild throat tightness and shob. Speaking in full sentences in triage without distress. No longer has an epi pen because it expired.

## 2021-03-18 DIAGNOSIS — E781 Pure hyperglyceridemia: Secondary | ICD-10-CM | POA: Diagnosis not present

## 2021-03-18 DIAGNOSIS — I10 Essential (primary) hypertension: Secondary | ICD-10-CM | POA: Diagnosis not present

## 2021-03-18 DIAGNOSIS — E039 Hypothyroidism, unspecified: Secondary | ICD-10-CM | POA: Diagnosis not present

## 2021-03-18 DIAGNOSIS — I209 Angina pectoris, unspecified: Secondary | ICD-10-CM | POA: Diagnosis not present

## 2021-03-26 ENCOUNTER — Other Ambulatory Visit: Payer: Self-pay | Admitting: Interventional Cardiology

## 2021-04-11 DIAGNOSIS — M542 Cervicalgia: Secondary | ICD-10-CM | POA: Diagnosis not present

## 2021-04-25 DIAGNOSIS — M5030 Other cervical disc degeneration, unspecified cervical region: Secondary | ICD-10-CM | POA: Diagnosis not present

## 2021-04-25 DIAGNOSIS — M4712 Other spondylosis with myelopathy, cervical region: Secondary | ICD-10-CM | POA: Diagnosis not present

## 2021-05-11 ENCOUNTER — Telehealth: Payer: Self-pay | Admitting: *Deleted

## 2021-05-11 NOTE — Telephone Encounter (Signed)
   [facility] Pre-operative Risk Assessment    Patient Name: [name]  DOB: [birth year] MRN: [id number]  HEARTCARE STAFF:  - IMPORTANT!!!!!! Under Visit Info/Reason for Call, type in Other and utilize the format Clearance MM/DD/YY or Clearance TBD. Do not use dashes or single digits. - Please review there is not already an duplicate clearance open for this procedure. - If request is for dental extraction, please clarify the # of teeth to be extracted. - If the patient is currently at the dentist's office, call Pre-Op Callback Staff (MA/nurse) to input urgent request.  - If the patient is not currently in the dentist office, please route to the Pre-Op pool.  Request for surgical clearance:  What type of surgery is being performed? ACDF C3-5  When is this surgery scheduled? TBD  What type of clearance is required (medical clearance vs. Pharmacy clearance to hold med vs. Both)? MEDICAL  Are there any medications that need to be held prior to surgery and how long?  ASA  Practice name and name of physician performing surgery? [facility]; DR. DAHARI BROOKS  What is the office phone number? [phone]   7.   What is the office fax number? [place]   8.   Anesthesia type (None, local, MAC, general) ? GENERAL    Julaine Hua 05/11/2021, 4:32 PM  _________________________________________________________________   (provider comments below)

## 2021-05-12 NOTE — Telephone Encounter (Signed)
1st attempt to reach pt to schedule appt for preop clearance. Lvm 

## 2021-05-12 NOTE — Telephone Encounter (Signed)
Pt is scheduled to see Ronie Spies PA-C on 9/13 for preop clearance

## 2021-05-12 NOTE — Telephone Encounter (Signed)
   Name: [name]  DOB: Sep 26, 1960  MRN: [id number]  Primary Cardiologist: Lesleigh Noe, MD  Chart reviewed as part of pre-operative protocol coverage. Because of [name]'s past medical history and time since last visit, he will require a follow-up visit in order to better assess preoperative cardiovascular risk.   Can we see if appt on 07/18/21 can be moved up?   Pre-op covering staff: - Please schedule appointment and call patient to inform them. If patient already had an upcoming appointment within acceptable timeframe, please add "pre-op clearance" to the appointment notes so provider is aware. - Please contact requesting surgeon's office via preferred method (i.e, phone, fax) to inform them of need for appointment prior to surgery.  If applicable, this message will also be routed to pharmacy pool and/or primary cardiologist for input on holding anticoagulant/antiplatelet agent as requested below so that this information is available to the clearing provider at time of patient's appointment.   Roe Rutherford Ketrina Boateng, PA  05/12/2021, 7:33 AM

## 2021-05-13 DIAGNOSIS — Z23 Encounter for immunization: Secondary | ICD-10-CM | POA: Diagnosis not present

## 2021-05-13 DIAGNOSIS — M542 Cervicalgia: Secondary | ICD-10-CM | POA: Diagnosis not present

## 2021-05-13 DIAGNOSIS — I10 Essential (primary) hypertension: Secondary | ICD-10-CM | POA: Diagnosis not present

## 2021-05-13 DIAGNOSIS — I209 Angina pectoris, unspecified: Secondary | ICD-10-CM | POA: Diagnosis not present

## 2021-05-16 ENCOUNTER — Encounter: Payer: Self-pay | Admitting: Physician Assistant

## 2021-05-16 NOTE — Progress Notes (Addendum)
Cardiology Office Note    Date:  05/17/2021   ID:  JAIREN GOLDFARB, DOB December 10, 1960, MRN [id number]  PCP:  Merlene Laughter, MD  Cardiologist:  Lesleigh Noe, MD  Electrophysiologist:  None   Chief Complaint: f/u CAD, preop clearance  History of Present Illness:   [name] is a 60 y.o. male with history of mild-moderate nonobstructive coronary disease by cath 2019, HLD, HTN, tobacco abuse, Graves disease, arthritis, bipolar disorder, hiatal hernia, DDD, Barrett's esophagus, mild carotid and possible right subclavian artery disease, former tobacco abuse who presents for follow-up.   His prior cardiac testing all occurred in 2019. 2D showed EF 55-60%, mild LAE, aortic sclerosis without stenosis - the aortic sclerosis was felt to be the cause of murmur. Cardiac cath for chest pain showed 50 to 60% distal circumflex, 50 to 60% proximal first diagonal, 20 to 30% proximal to distal RCA, and luminal irregularities in the proximal to distal LAD. This was managed medically. He also was having issues with syncope when turning his head to the right around that time. Carotid showed 1-39% BICA  stenosis, disturbed subclavian flow, but antegrade vertebral flow. MRA was normal. He saw Dr. Corliss Skains and underwent IR angiogram for vertebrobasilar insufficiency showing left vertebrobasilar stenosis with recommendation to follow in the future. I do not see he has had follow-up imaging since that time. His last in-person OV was in 2019. We were unable to reach him for his last telehealth visit in 11/2019.  He is seen back for follow-up overall feeling well from cardiac standpoint. Several years ago, around time of cath, he used to have to take SL NTG with fair frequency but has not had to take any regularly for quite some time (only 1 SL NTG in months for brief episode of CP). He is able to work in the yard for hours without any dyspnea or angina. He has been intentionally losing weight per his report, about  30lb over 2 years time. He is hoping to have surgery on his spine in a few weeks (no date set yet). BP is elevated today which he feels is an outlier from his usual readings at home. He feels it might be elevated due to anxiety of being here. He quit smoking 3 weeks ago!   Labwork independently reviewed: 05/2021 BUN 17, K 4.1, Cr 0.830 10/2020 HDL 50, LDL 48, trig 81, TSH wnl, Hgb 13.9 04/2018 WBC 11, Hgb 13.6, Plt wnl, K 4.1, Cr 0.4, AP 125, AST 30, ALT 54, LDL 59   Past Medical History:  Diagnosis Date   Arthritis    neck and knee   Barrett's esophagus    Bipolar 1 disorder (HCC)    CAD in native artery    Cervical disc syndrome    Depression    GERD (gastroesophageal reflux disease)    Grave's disease    Graves' disease 10/04/2011   H/O prostatitis    Hiatal hernia    Hypertension    Lumbar degenerative disc disease    Mild carotid artery disease (HCC)    Mixed hyperlipidemia 12/27/2017   Stenosis of left vertebrobasilar artery    Stenosis of right subclavian artery (HCC)    Tobacco abuse 12/27/2017    Past Surgical History:  Procedure Laterality Date   CARDIAC CATHETERIZATION  2000   CHOLECYSTECTOMY  12/11/2013   DR Andrey Campanile    CHOLECYSTECTOMY N/A 12/11/2013   Procedure: LAPAROSCOPIC CHOLECYSTECTOMY WITH INTRAOPERATIVE CHOLANGIOGRAM;  Surgeon: Atilano Ina, MD;  Location: [facility] OR;  Service: General;  Laterality: N/A;   IR ANGIO INTRA EXTRACRAN SEL COM CAROTID INNOMINATE BILAT MOD SED  05/02/2018   IR ANGIO VERTEBRAL SEL VERTEBRAL BILAT MOD SED  05/02/2018   KNEE SURGERY     LEFT HEART CATH AND CORONARY ANGIOGRAPHY N/A 03/12/2018   Procedure: LEFT HEART CATH AND CORONARY ANGIOGRAPHY;  Surgeon: Lyn Records, MD;  Location: [facility] INVASIVE CV LAB;  Service: Cardiovascular;  Laterality: N/A;   testicular torsion surgery     THYROIDECTOMY      Current Medications: Current Meds  Medication Sig   amLODipine (NORVASC) 10 MG tablet Take 10 mg by mouth daily.    aspirin EC 81 MG  tablet Take 81 mg by mouth daily.   atorvastatin (LIPITOR) 40 MG tablet TAKE 1 TABLET BY MOUTH ONCE DAILY(LAST AND FINAL ATTEMPT NO MORE REFILLS AFTER THIS UNTIL YOU ARE SEEN)   buPROPion (WELLBUTRIN XL) 150 MG 24 hr tablet Take 450 mg by mouth daily.   doxazosin (CARDURA) 4 MG tablet Take 4 mg by mouth 2 (two) times daily.   EPINEPHrine 0.3 mg/0.3 mL IJ SOAJ injection Inject 0.3 mg into the muscle as needed for anaphylaxis.   labetalol (NORMODYNE) 300 MG tablet Take 300 mg by mouth 2 (two) times daily.   levothyroxine (SYNTHROID, LEVOTHROID) 175 MCG tablet Take 175 mcg by mouth daily before breakfast.    lisinopril (PRINIVIL,ZESTRIL) 10 MG tablet Take 10 mg by mouth at bedtime.    nitroGLYCERIN (NITROSTAT) 0.4 MG SL tablet Place 0.4 mg under the tongue every 5 (five) minutes as needed for chest pain.   omeprazole (PRILOSEC) 40 MG capsule Take 40 mg by mouth daily.   oxycodone (ROXICODONE) 30 MG immediate release tablet Take 30 mg by mouth every 8 (eight) hours as needed for pain.   [DISCONTINUED] diazepam (VALIUM) 5 MG tablet Take 10 mg by mouth at bedtime.   [DISCONTINUED] doxepin (SINEQUAN) 10 MG capsule Take 20 mg by mouth at bedtime.    [DISCONTINUED] ziprasidone (GEODON) 40 MG capsule Take 40 mg by mouth daily.      Allergies:   Bee venom   Social History   Socioeconomic History   Marital status: Divorced    Spouse name: Not on file   Number of children: 1   Years of education: Not on file   Highest education level: Some college, no degree  Occupational History   Not on file  Tobacco Use   Smoking status: Former    Packs/day: 0.25    Years: 35.00    Pack years: 8.75    Types: Cigarettes    Quit date: 01/28/2015    Years since quitting: 6.3   Smokeless tobacco: Never  Vaping Use   Vaping Use: Never used  Substance and Sexual Activity   Alcohol use: No   Drug use: No   Sexual activity: Never  Other Topics Concern   Not on file  Social History Narrative   Lives at  home with his parents    Right handed   Caffeine: about 36 oz of soda daily   Social Determinants of Health   Financial Resource Strain: Not on file  Food Insecurity: Not on file  Transportation Needs: Not on file  Physical Activity: Not on file  Stress: Not on file  Social Connections: Not on file     Family History:  The patient's family history includes Cancer in an other family member; Hypertension in his father, mother, and another family  member; Valvular heart disease in his father.  ROS:   Please see the history of present illness.  All other systems are reviewed and otherwise negative.    EKGs/Labs/Other Studies Reviewed:    Studies reviewed are outlined and summarized above. Reports included below if pertinent.  Carotid US 03/2018 Final Interpretation:  Right Carotid: Velocities in the right ICA are consistent with a 1-39%  stenosis.                 The RICA velocities remain within normal range and stable                 compared to the prior exam.   Left Carotid: Velocities in the left ICA are consistent with a 1-39%  stenosis.                The LICA velocities remain within normal range and stable  compared                to the prior exam.   Vertebrals:  Bilateral vertebral arteries demonstrate antegrade flow.  Subclavians: Right subclavian artery flow was disturbed. Normal flow               hemodynamics were seen in the left subclavian artery.   *See table(s) above for measurements and observations.     Cath 2019 Three-vessel mild to moderate nonobstructive coronary disease with 50 to 60% distal circumflex, 50 to 60% proximal first diagonal, 20 to 30% proximal to distal RCA, and luminal irregularities in the proximal to distal LAD. Normal left ventricular size and function.  Estimated ejection fraction 65%. Acticin coronary calcium score greater than 400, documented by previous coronary CTA   RECOMMENDATIONS:   Aggressive risk factor modification:  LDL less than 70 and perhaps pushing to 50, < blood pressure 130/80 mmHg, monitor hemoglobin A1c and maintain less than 7, aspirin 81 mg/day, and 150 minutes of moderate aerobic activity per week   2D echo 01/2018 Study Conclusions   - Left ventricle: The cavity size was normal. Wall thickness was    normal. Systolic function was normal. The estimated ejection    fraction was in the range of 55% to 60%. Wall motion was normal;    there were no regional wall motion abnormalities. Left    ventricular diastolic function parameters were normal.  - Aortic valve: Trileaflet; mildly calcified leaflets. Sclerosis    without stenosis. Mean gradient (S): 9 mm Hg.  - Mitral valve: There was trivial regurgitation.  - Left atrium: The atrium was mildly dilated.  - Right ventricle: The cavity size was normal. Systolic function    was normal.  - Tricuspid valve: Peak RV-RA gradient (S): 30 mm Hg.  - Pulmonary arteries: PA peak pressure: 33 mm Hg (S).  - Inferior vena cava: The vessel was normal in size. The    respirophasic diameter changes were in the normal range (>= 50%),    consistent with normal central venous pressure.   Impressions:   - Normal LV size with EF 55-60%. Normal diastolic function. Normal    RV size and systolic function. Aortic valve sclerosis without    significant stenosis.       EKG:  EKG is ordered today, personally reviewed, demonstrating NSR 63bpm, no acute STT changes (general ST upsloping diffusely as seen in prior tracings - chronic without change)  Recent Labs: See above.  PHYSICAL EXAM:    VS:  BP 140/88 (BP Location: Right Arm)   Pulse  63   Ht 5\' 9"  (1.753 m)   Wt 158 lb 3.2 oz (71.8 kg)   SpO2 98%   BMI 23.36 kg/m   BMI: Body mass index is 23.36 kg/m.  GEN: Well nourished, well developed male in no acute distress HEENT: normocephalic, atraumatic Neck: no JVD or masses, + faint right carotid bruit Cardiac: RRR; + very faint SEM, no rubs or gallops, no  edema, normal S1 S2 Respiratory:  clear to auscultation bilaterally, normal work of breathing GI: soft, nontender, nondistended, + BS MS: no deformity or atrophy Skin: warm and dry, no rash Neuro:  Alert and Oriented x 3, Strength and sensation are intact, follows commands Psych: euthymic mood, full affect  Wt Readings from Last 3 Encounters:  05/17/21 158 lb 3.2 oz (71.8 kg)  05/29/18 179 lb 6.4 oz (81.4 kg)  05/02/18 173 lb (78.5 kg)     ASSESSMENT & PLAN:   1. Preop cardiovascular examination - clinically the patient is doing well without any progressive angina. He has been able to exert >4 METS working out in the yard for hours without angina or dyspnea. I do not think he requires further ischemic testing prior to surgery. However, I would like to ensure his BP is controlled before proceeding with surgery. He feels today was an outlier. We discussed empirically increasing lisinopril but he would like to check his BP at home over the next several days and notify 05/04/18 of readings on Friday. If BP is elevated above 130 systolic, would suggest increasing lisinopril to 20mg  daily with recheck BMET in 1 week. We also need to ensure thyroid function is OK. He also needs a carotid duplex before we can finalize clearance. Will arrange. I'll send this updated note to surgeon so they are aware further testing forthcoming. ADDENDUM 05/26/21: Patient has since relayed follow-up blood pressures which are all within normal limits, therefore medication increase not needed at this time. Carotid duplex study does not show any significant blockages in carotids - only mild plaque - and no significant abnormalities in the subclavians. I think the greater need is following up with Dr. IR as the patient plans, given the significant vertebrobasilar stenosis he had in 2019, with recommendation for 6 month follow-up at that time that has not yet occurred. Patient reports he has scheduled an appointment with them. I  think we can clear him from a cardiac standpoint but I would recommend the surgeon request clearance from Dr. Corliss Skains team as well given the his history and dizziness. From our standpoint there is no contraindication to holding ASA for the surgery but again I would suggest the surgeon also verify clearance with Dr. 2020. Will fax this note via Epic upon signing. Please call with questions.  2. CAD - symptoms controlled, continue ASA, amlodipine, labetalol, atorvastatin.   3. Essential HTN - see above regarding management. Will also check TSH and free T4 to exclude contribution of thyroid to elevated BP.   4. Hyperlipidemia goal LDL <70 - continue atorvastatin. Recent labs reviewed from primary care in 10/2020, LDL at goal at 48. He will continue to follow with primary care for his usual bloodwork.  5. Carotid artery disease, possible right subclavian stenosis - BPs were equal bilaterally. He does have a right subclavian bruit. No symptoms to suggest subclavian steal. He has some dizziness specifically when looking up or to the right, but not related to head position changes. No recurrent syncope. We'll update his carotid duplex prior to clearing for  surgery. After his visit I did see he also had additional imaging by Dr. Loni Beckwith for some vertebrobasilar disease. When we call him with lab results, we will also suggest that he make sure to follow-up with them as well as previously suggested. Addendum: see patient mychart message with complaints of dizziness when reaching overhead. We will continue plan with carotid duplex but seek input from our [facility] team. Also encouraged f/u as previously recommended above. Would defer surgical clearance until this is evaluated in completeness and we know no further procedures needed to intervene.  6. Aortic sclerosis - very minimal murmur on exam, preserved S1, S2, no symptoms of aortic stenosis at this time. Follow clinically but consider echocardiogram in the  future if any recurrent or progressive symptoms.  Disposition: F/u with Dr. Katrinka Blazing in 1 year, sooner if needed. Will await workup as above before finalizing surgical clearance.  Medication Adjustments/Labs and Tests Ordered: Current medicines are reviewed at length with the patient today.  Concerns regarding medicines are outlined above. Medication changes, Labs and Tests ordered today are summarized above and listed in the Patient Instructions accessible in Encounters.   Signed, Laurann Montana, PA-C  05/17/2021 4:13 PM    [facility] [address] [place], [place], [place]  [address] Phone: [phone]; Fax: [phone]

## 2021-05-17 ENCOUNTER — Other Ambulatory Visit: Payer: Self-pay

## 2021-05-17 ENCOUNTER — Ambulatory Visit: Payer: Medicare Other | Admitting: Physician Assistant

## 2021-05-17 ENCOUNTER — Encounter: Payer: Self-pay | Admitting: Physician Assistant

## 2021-05-17 VITALS — BP 140/88 | HR 63 | Ht 69.0 in | Wt 158.2 lb

## 2021-05-17 DIAGNOSIS — Z01810 Encounter for preprocedural cardiovascular examination: Secondary | ICD-10-CM | POA: Diagnosis not present

## 2021-05-17 DIAGNOSIS — I358 Other nonrheumatic aortic valve disorders: Secondary | ICD-10-CM | POA: Diagnosis not present

## 2021-05-17 DIAGNOSIS — E785 Hyperlipidemia, unspecified: Secondary | ICD-10-CM

## 2021-05-17 DIAGNOSIS — I6523 Occlusion and stenosis of bilateral carotid arteries: Secondary | ICD-10-CM | POA: Diagnosis not present

## 2021-05-17 DIAGNOSIS — I10 Essential (primary) hypertension: Secondary | ICD-10-CM | POA: Diagnosis not present

## 2021-05-17 DIAGNOSIS — I771 Stricture of artery: Secondary | ICD-10-CM | POA: Diagnosis not present

## 2021-05-17 DIAGNOSIS — I2510 Atherosclerotic heart disease of native coronary artery without angina pectoris: Secondary | ICD-10-CM

## 2021-05-17 NOTE — Patient Instructions (Addendum)
Medication Instructions:  None ordered  *If you need a refill on your cardiac medications before your next appointment, please call your pharmacy*   Lab Work: TODAY:  TSH & FT4   If you have labs (blood work) drawn today and your tests are completely normal, you will receive your results only by: MyChart Message (if you have MyChart) OR A paper copy in the mail If you have any lab test that is abnormal or we need to change your treatment, we will call you to review the results.   Testing/Procedures: Your physician has requested that you have a carotid duplex. This test is an ultrasound of the carotid arteries in your neck. It looks at blood flow through these arteries that supply the brain with blood. Allow one hour for this exam. There are no restrictions or special instructions.    Follow-Up: At [facility], you and your health needs are our priority.  As part of our continuing mission to provide you with exceptional heart care, we have created designated Provider Care Teams.  These Care Teams include your primary Cardiologist (physician) and Advanced Practice Providers (APPs -  Physician Assistants and Nurse Practitioners) who all work together to provide you with the care you need, when you need it.  We recommend signing up for the patient portal called "MyChart".  Sign up information is provided on this After Visit Summary.  MyChart is used to connect with patients for Virtual Visits (Telemedicine).  Patients are able to view lab/test results, encounter notes, upcoming appointments, etc.  Non-urgent messages can be sent to your provider as well.   To learn more about what you can do with MyChart, go to [email or web address].    Your next appointment:   12 month(s)  The format for your next appointment:   In Person  Provider:   You may see Lesleigh Noe, MD or one of the following Advanced Practice Providers on your designated Care Team:   Nada Boozer, NP    Other  Instructions  We need to get a better idea of what your blood pressure is running at home. Here are some instructions to follow: - I would recommend using a blood pressure cuff that goes on your arm. The wrist ones can be inaccurate. If you're purchasing one for the first time, try to select one that also reports your heart rate because this can be helpful information as well. - If you can sample it at different times of the day, that's great - it might give you more information about how your blood pressure fluctuates. Remain seated in a chair for 5 minutes quietly beforehand, then check it.  - Please record a list of those readings and call us/send in MyChart message with them for our review on Friday 9/16 morning.

## 2021-05-18 LAB — TSH: TSH: 1.16 u[IU]/mL (ref 0.450–4.500)

## 2021-05-18 LAB — T4, FREE: Free T4: 1.25 ng/dL (ref 0.82–1.77)

## 2021-05-20 DIAGNOSIS — I771 Stricture of artery: Secondary | ICD-10-CM

## 2021-05-20 DIAGNOSIS — I6523 Occlusion and stenosis of bilateral carotid arteries: Secondary | ICD-10-CM

## 2021-05-20 NOTE — Telephone Encounter (Signed)
Call placed to pt regarding message below.  Pt has been made aware that his pressures look good, but his symptoms he is describing do warrant further evaluation.  He is aware that  1.  I will put in a referral for PV Consult with Dr. Allyson Sabal or Dr. Kirke Corin and  2.  He needs to call Dr. Fatima Sanger office to allow them to guide him on additional testing / follow-up.  Pt aware that this will push his surgical clearance out until this is addressed, and pt was perfectly fine with that, as he stated that he would rather take care of this first.  Will schedule pt an appt 6-8 weeks with Ronie Spies, PA-C.

## 2021-05-20 NOTE — Telephone Encounter (Signed)
Please let know blood pressures look good but the symptoms he is describing do warrant further evaluation. These symptoms are concerning to me for subclavian steal. Let's keep the plan for his carotid duplex to look at the subclavian problem he's had before, but please place referral to one of our PV doctors to weigh in (Dr. Allyson Sabal and Dr. Kirke Corin). I also saw that he had previously seen Dr. Corliss Skains for vertebrobasilar insufficiency with last IR procedure in 2019 stating "Discussed findings with the patient and his spouse. Follow-up with MRA of the brain in 6 months to evaluate for progression of the left vertebrobasilar stenosis as described above. Patient advised to avoid hyperextension of his neck to avoid symptoms of dizziness which apparently have been chronic as per patient and his spouse." I do not see any subsequent imaging since then so would recommend he reach out to their office to seek their input on additional testing/follow-up as well.  I would recommend to defer his surgical clearance until this is evaluated in completeness, to make sure he will not require any procedures before then. So in addition to PV followup, please make a 6-8 week follow-up with me to readdress clearance. Either have him update the surgeon or re-route note to them. Thx.

## 2021-05-24 ENCOUNTER — Other Ambulatory Visit: Payer: Self-pay

## 2021-05-24 ENCOUNTER — Ambulatory Visit (HOSPITAL_COMMUNITY)
Admission: RE | Admit: 2021-05-24 | Discharge: 2021-05-24 | Disposition: A | Discharge status: Home / Self Care | Payer: Medicare Other | Source: Ambulatory Visit | Attending: Cardiology | Admitting: Cardiology

## 2021-05-24 DIAGNOSIS — I6523 Occlusion and stenosis of bilateral carotid arteries: Secondary | ICD-10-CM | POA: Diagnosis not present

## 2021-05-24 DIAGNOSIS — I10 Essential (primary) hypertension: Secondary | ICD-10-CM | POA: Diagnosis not present

## 2021-05-24 DIAGNOSIS — I771 Stricture of artery: Secondary | ICD-10-CM | POA: Diagnosis not present

## 2021-05-24 DIAGNOSIS — E785 Hyperlipidemia, unspecified: Secondary | ICD-10-CM | POA: Diagnosis not present

## 2021-05-24 DIAGNOSIS — I2510 Atherosclerotic heart disease of native coronary artery without angina pectoris: Secondary | ICD-10-CM

## 2021-05-24 DIAGNOSIS — Z01810 Encounter for preprocedural cardiovascular examination: Secondary | ICD-10-CM | POA: Diagnosis not present

## 2021-05-26 NOTE — Addendum Note (Signed)
Addended by: Burnetta Sabin on: 05/26/2021 10:26 AM   Modules accepted: Orders

## 2021-05-26 NOTE — Telephone Encounter (Addendum)
Updated result note on Carotid duplex 9/22 sent to MA to defer PV consult for now- "Please let patient know finalized carotid duplex study does not show any significant blockages in carotids - only mild plaque. Good news. Subclavians looks OK too so I would off on PV referral to [facility] - I think the greater need is following up with Dr. Corliss Skains IR as the patient plans, given the significant vertebrobasilar stenosis he had in 2019. I think we can clear him from a cardiac standpoint so I will addend my note that we may do so, but I will also recommend the surgeon request clearance from Dr. Fatima Sanger team as well given the his history and dizziness. From our standpoint there is no contraindication to holding ASA for the surgery but again I would suggest the surgeon also verify clearance with Dr. Corliss Skains. I'll addend my note and route to the surgical team. Otherwise keep f/u as planned. Thank you!"

## 2021-05-30 ENCOUNTER — Other Ambulatory Visit (HOSPITAL_COMMUNITY): Payer: Self-pay | Admitting: Interventional Radiology

## 2021-05-30 ENCOUNTER — Telehealth (HOSPITAL_COMMUNITY): Payer: Self-pay

## 2021-05-30 DIAGNOSIS — I6503 Occlusion and stenosis of bilateral vertebral arteries: Secondary | ICD-10-CM

## 2021-05-30 DIAGNOSIS — I6523 Occlusion and stenosis of bilateral carotid arteries: Secondary | ICD-10-CM

## 2021-05-30 DIAGNOSIS — R42 Dizziness and giddiness: Secondary | ICD-10-CM

## 2021-05-30 NOTE — Telephone Encounter (Signed)
Called to schedule cta head/neck, no answer, left vm. AW 

## 2021-06-03 ENCOUNTER — Other Ambulatory Visit: Payer: Self-pay

## 2021-06-03 ENCOUNTER — Ambulatory Visit (HOSPITAL_COMMUNITY)
Admission: RE | Admit: 2021-06-03 | Discharge: 2021-06-03 | Disposition: A | Discharge status: Home / Self Care | Payer: Medicare Other | Source: Ambulatory Visit | Attending: Interventional Radiology | Admitting: Interventional Radiology

## 2021-06-03 DIAGNOSIS — R42 Dizziness and giddiness: Secondary | ICD-10-CM | POA: Diagnosis not present

## 2021-06-03 DIAGNOSIS — I6523 Occlusion and stenosis of bilateral carotid arteries: Secondary | ICD-10-CM | POA: Diagnosis not present

## 2021-06-03 DIAGNOSIS — I6503 Occlusion and stenosis of bilateral vertebral arteries: Secondary | ICD-10-CM | POA: Diagnosis not present

## 2021-06-03 DIAGNOSIS — I672 Cerebral atherosclerosis: Secondary | ICD-10-CM | POA: Diagnosis not present

## 2021-06-03 LAB — POCT I-STAT CREATININE: Creatinine, Ser: 0.9 mg/dL (ref 0.61–1.24)

## 2021-06-03 MED ORDER — IOHEXOL 350 MG/ML SOLN
75.0000 mL | Freq: Once | INTRAVENOUS | Status: AC | PRN
  Administered 2021-06-03: 75 mL via INTRAVENOUS

## 2021-06-06 ENCOUNTER — Telehealth (HOSPITAL_COMMUNITY): Payer: Self-pay

## 2021-06-06 NOTE — Telephone Encounter (Signed)
Called pt regarding recent imaging, no answer, left vm. AW  

## 2021-06-08 ENCOUNTER — Telehealth (HOSPITAL_COMMUNITY): Payer: Self-pay

## 2021-06-08 NOTE — Telephone Encounter (Signed)
Pt agreed to f/u in 6 months with us carotid. AW 

## 2021-06-09 NOTE — Telephone Encounter (Signed)
Hi! Per 9/22 carotid duplex result note: "I think we can clear him from a cardiac standpoint so I will addend my note that we may do so, but I will also recommend the surgeon request clearance from Dr. Fatima Sanger team as well given the his history and dizziness. From our standpoint there is no contraindication to holding ASA for the surgery but again I would suggest the surgeon also verify clearance with Dr. Corliss Skains. I'll addend my note and route to the surgical team." I routed my OV note with addendum on 9/22 @ 7:26am, but OK to route again if needed and confirm they got it.

## 2021-06-14 ENCOUNTER — Telehealth (HOSPITAL_COMMUNITY): Payer: Self-pay

## 2021-06-14 NOTE — Telephone Encounter (Signed)
Pt agreed to f/u in 6 months with us carotid. AW 

## 2021-06-16 ENCOUNTER — Telehealth (HOSPITAL_COMMUNITY): Payer: Self-pay

## 2021-06-16 NOTE — Telephone Encounter (Signed)
Returned pt's call, gave him updated fax number. AW

## 2021-06-29 NOTE — Progress Notes (Addendum)
Cardiology Office Note    Date:  07/04/2021   ID:  [name], DOB [birth year], MRN [id number]  PCP:  Merlene Laughter, MD  Cardiologist:  Lesleigh Noe, MD  Electrophysiologist:  None   Chief Complaint: f/u testing, BP  History of Present Illness:   [name] is a 60 y.o. male with history of mild-moderate nonobstructive coronary disease by cath 2019, HLD, HTN, tobacco abuse, Graves disease, arthritis, bipolar disorder, hiatal hernia, DDD, Barrett's esophagus, mild carotid and possible right subclavian artery disease, vertebrobasilar stenosis, former tobacco abuse who presents for follow-up.   His prior cardiac testing initially occurred in 2019. 2D showed EF 55-60%, mild LAE, aortic sclerosis without stenosis - the aortic sclerosis was felt to be the cause of murmur. Cardiac cath for chest pain showed 50 to 60% distal circumflex, 50 to 60% proximal first diagonal, 20 to 30% proximal to distal RCA, and luminal irregularities in the proximal to distal LAD. This was managed medically. He also was having issues with syncope when turning his head to the right around that time. Carotid showed 1-39% BICA  stenosis, disturbed right subclavian flow, but antegrade vertebral flow. He saw Dr. Corliss Skains and underwent IR angiogram for vertebrobasilar insufficiency showing left vertebrobasilar stenosis with recommendation to follow in the future. He was recently seen back in clinic for spine surgery clearance. He was overall doing well from cardiac standpoint. albeit with slightly elevated blood pressure with plan to follow at home. Repeat carotid study 05/2021 showed 1-39% BICA, normal subclavians. I cleared him for surgery but suggested he also see Dr. Corliss Skains back as well as he was still having some dizziness when looking to the right or reaching overhead. Repeat CT angio head/neck showed carotid and vertebral atherosclerosis without noted progression from 2019. There was no proximal subclavian  stenosis either.  He is seen back for follow-up overall doing well from cardiac standpoint without any chest pain or SOB. He states Dr. Corliss Skains told him his results would not be causing the dizziness he experiences. He also states that Dr. Shon Baton told him this could be coming from his myelopathy. He does note for the last 3 weeks he's had a low grade temp elevation each evening to about 100*F. His grandson had RSV a few weeks ago, but the patient has not had any actual symptoms besides feeling colder than usual one day. He has not had any weight loss, hemoptysis or other localizing symptoms. Repeat BP L 126/80, R 124/82.   Labwork independently reviewed: [facility] 05/2021 BUN 17, K 4.1, Cr 0.830, EMR: TSH and Ft4 wnl [facility] 10/2020 HDL 50, LDL 48, trig 81, TSH wnl, Hgb 13.9 04/2018 WBC 11, Hgb 13.6, Plt wnl, K 4.1, Cr 0.4, AP 125, AST 30, ALT 54, LDL 59   Past Medical History:  Diagnosis Date   Arthritis    neck and knee   Barrett's esophagus    Bipolar 1 disorder (HCC)    CAD in native artery    Cervical disc syndrome    Depression    GERD (gastroesophageal reflux disease)    Grave's disease    Graves' disease 10/04/2011   H/O prostatitis    Hiatal hernia    Hypertension    Lumbar degenerative disc disease    Mild carotid artery disease (HCC)    Mixed hyperlipidemia 12/27/2017   Stenosis of left vertebrobasilar artery    Stenosis of right subclavian artery (HCC)    Tobacco abuse 12/27/2017    Past  Surgical History:  Procedure Laterality Date   CARDIAC CATHETERIZATION  2000   CHOLECYSTECTOMY  12/11/2013   DR Andrey Campanile    CHOLECYSTECTOMY N/A 12/11/2013   Procedure: LAPAROSCOPIC CHOLECYSTECTOMY WITH INTRAOPERATIVE CHOLANGIOGRAM;  Surgeon: Atilano Ina, MD;  Location[facility] OR;  Service: General;  Laterality: N/A;   IR ANGIO INTRA EXTRACRAN SEL COM CAROTID INNOMINATE BILAT MOD SED  05/02/2018   IR ANGIO VERTEBRAL SEL VERTEBRAL BILAT MOD SED  05/02/2018   KNEE SURGERY     LEFT HEART CATH AND  CORONARY ANGIOGRAPHY N/A 03/12/2018   Procedure: LEFT HEART CATH AND CORONARY ANGIOGRAPHY;  Surgeon: Lyn Records, MD;  Location: [facility] INVASIVE CV LAB;  Service: Cardiovascular;  Laterality: N/A;   testicular torsion surgery     THYROIDECTOMY      Current Medications: Current Meds  Medication Sig   amLODipine (NORVASC) 5 MG tablet Take 5 mg by mouth daily.   aspirin EC 81 MG tablet Take 81 mg by mouth daily.   buPROPion (WELLBUTRIN XL) 150 MG 24 hr tablet Take 450 mg by mouth daily.   Cholecalciferol (VITAMIN D) 50 MCG (2000 UT) tablet Take 2,000 Units by mouth daily.   doxazosin (CARDURA) 4 MG tablet Take 4 mg by mouth 2 (two) times daily.   EPINEPHrine 0.3 mg/0.3 mL IJ SOAJ injection Inject 0.3 mg into the muscle as needed for anaphylaxis.   labetalol (NORMODYNE) 300 MG tablet Take 300 mg by mouth 2 (two) times daily.   levothyroxine (SYNTHROID) 150 MCG tablet Take 150 mcg by mouth daily before breakfast.   lisinopril (PRINIVIL,ZESTRIL) 10 MG tablet Take 10 mg by mouth at bedtime.    nitroGLYCERIN (NITROSTAT) 0.4 MG SL tablet Place 0.4 mg under the tongue every 5 (five) minutes as needed for chest pain.   omeprazole (PRILOSEC) 20 MG capsule Take 20 mg by mouth daily.   oxycodone (ROXICODONE) 30 MG immediate release tablet Take 30 mg by mouth in the morning, at noon, and at bedtime.   QUEtiapine (SEROQUEL) 100 MG tablet Take 300 mg by mouth at bedtime.   atorvastatin (LIPITOR) 40 MG tablet TAKE 1 TABLET BY MOUTH ONCE DAILY(LAST AND FINAL ATTEMPT NO MORE REFILLS AFTER THIS UNTIL YOU ARE SEEN) (Patient taking differently: Take 40 mg by mouth every evening.)      Allergies:   Bee venom   Social History   Socioeconomic History   Marital status: Divorced    Spouse name: Not on file   Number of children: 1   Years of education: Not on file   Highest education level: Some James, no degree  Occupational History   Not on file  Tobacco Use   Smoking status: Former    Packs/day: 0.25     Years: 35.00    Pack years: 8.75    Types: Cigarettes    Quit date: 01/28/2015    Years since quitting: 6.4   Smokeless tobacco: Never  Vaping Use   Vaping Use: Never used  Substance and Sexual Activity   Alcohol use: No   Drug use: No   Sexual activity: Never  Other Topics Concern   Not on file  Social History Narrative   Lives at home with his parents    Right handed   Caffeine: about 36 oz of soda daily   Social Determinants of Health   Financial Resource Strain: Not on file  Food Insecurity: Not on file  Transportation Needs: Not on file  Physical Activity: Not on file  Stress: Not  on file  Social Connections: Not on file     Family History:  The patient's family history includes Cancer in an other family member; Hypertension in his father, mother, and another family member; Valvular heart disease in his father.  ROS:   Please see the history of present illness.  All other systems are reviewed and otherwise negative.    EKGs/Labs/Other Studies Reviewed:    Studies reviewed are outlined and summarized above. Reports included below if pertinent.  Carotid US 05/2021 Summary:  Right Carotid: Velocities in the right ICA are consistent with a 1-39%  stenosis.   Left Carotid: Velocities in the left ICA are consistent with a 1-39%  stenosis.   Vertebrals:  Bilateral vertebral arteries demonstrate antegrade flow.  Subclavians: Normal flow hemodynamics were seen in bilateral subclavian               arteries.   *See table(s) above for measurements and observations.       Electronically signed by Lorine Bears MD on 05/25/2021 at 4:22:08 PM.      Carotid US 03/2018 Final Interpretation:  Right Carotid: Velocities in the right ICA are consistent with a 1-39%  stenosis.                 The RICA velocities remain within normal range and stable                 compared to the prior exam.   Left Carotid: Velocities in the left ICA are consistent with a 1-39%   stenosis.                The LICA velocities remain within normal range and stable  compared                to the prior exam.   Vertebrals:  Bilateral vertebral arteries demonstrate antegrade flow.  Subclavians: Right subclavian artery flow was disturbed. Normal flow               hemodynamics were seen in the left subclavian artery.   *See table(s) above for measurements and observations.     Cath 2019 Three-vessel mild to moderate nonobstructive coronary disease with 50 to 60% distal circumflex, 50 to 60% proximal first diagonal, 20 to 30% proximal to distal RCA, and luminal irregularities in the proximal to distal LAD. Normal left ventricular size and function.  Estimated ejection fraction 65%. Acticin coronary calcium score greater than 400, documented by previous coronary CTA   RECOMMENDATIONS:   Aggressive risk factor modification: LDL less than 70 and perhaps pushing to 50, < blood pressure 130/80 mmHg, monitor hemoglobin A1c and maintain less than 7, aspirin 81 mg/day, and 150 minutes of moderate aerobic activity per week   2D echo 01/2018 Study Conclusions   - Left ventricle: The cavity size was normal. Wall thickness was    normal. Systolic function was normal. The estimated ejection    fraction was in the range of 55% to 60%. Wall motion was normal;    there were no regional wall motion abnormalities. Left    ventricular diastolic function parameters were normal.  - Aortic valve: Trileaflet; mildly calcified leaflets. Sclerosis    without stenosis. Mean gradient (S): 9 mm Hg.  - Mitral valve: There was trivial regurgitation.  - Left atrium: The atrium was mildly dilated.  - Right ventricle: The cavity size was normal. Systolic function    was normal.  - Tricuspid valve: Peak RV-RA gradient (S): 30 mm  Hg.  - Pulmonary arteries: PA peak pressure: 33 mm Hg (S).  - Inferior vena cava: The vessel was normal in size. The    respirophasic diameter changes were in the normal  range (>= 50%),    consistent with normal central venous pressure.   Impressions:   - Normal LV size with EF 55-60%. Normal diastolic function. Normal    RV size and systolic function. Aortic valve sclerosis without    significant stenosis.     EKG:  EKG is not ordered today, reviewed from last OV.  Recent Labs: 05/17/2021: TSH 1.160 06/03/2021: Creatinine, Ser 0.90  Recent Lipid Panel    Component Value Date/Time   CHOL 134 04/10/2018 0907   TRIG 111 04/10/2018 0907   HDL 53 04/10/2018 0907   CHOLHDL 2.5 04/10/2018 0907   CHOLHDL 7.1 10/04/2011 0545   VLDL 74 (H) 10/04/2011 0545   LDLCALC 59 04/10/2018 0907    PHYSICAL EXAM:    VS:  BP (!) 142/82   Pulse 68   Ht 5\' 9"  (1.753 m)   Wt 153 lb (69.4 kg)   SpO2 96%   BMI 22.59 kg/m   BMI: Body mass index is 22.59 kg/m.  GEN: Well nourished, well developed male in no acute distress HEENT: normocephalic, atraumatic Neck: no JVD, carotid bruits, or masses, no subclavian bruits but slightly diminished pulsatile sound on the right subclavian Cardiac: RRR; no murmurs, rubs, or gallops, no edema  Respiratory:  clear to auscultation bilaterally, normal work of breathing GI: soft, nontender, nondistended, + BS MS: no deformity or atrophy Skin: warm and dry, no rash Neuro:  Alert and Oriented x 3, Strength and sensation are intact, follows commands Psych: euthymic mood, full affect  Wt Readings from Last 3 Encounters:  07/04/21 153 lb (69.4 kg)  05/17/21 158 lb 3.2 oz (71.8 kg)  05/29/18 179 lb 6.4 oz (81.4 kg)     ASSESSMENT & PLAN:   1. Coronary artery disease with HLD goal LDL <70- no recent anginal symptoms. Continue ASA (OK to hold if needed for surgery as previously routed to surgeon), amlodipine, labetalol, atorvastatin. Last lipids reviewed, LDL at goal at 48 in 10/2020. He will continue to follow with primary care for his usual bloodwork. I previously routed my clearance note to surgeon from cardiac standpoint but did  ask patient to discuss with surgeon that he has been experiencing low grade temperature elevations the last few weeks.  2. Dizziness (with looking to the right or raising right arm up and looking upward), history of mild carotid artery disease - no revealing cause by carotid duplex or CT angio of the neck. No significant subclavian disease seen. Patient states Dr. 11/2020 told him this could be due to his myelopathy. I did have him stand in the office and turn his head/raise arm up to see if we could reproduce dizziness to exclude carotid baroreflex bradycardia. He was able to reproduce that sensation. HR remained normal the entire exam. His blood pressures are equal bilaterally as well. I will plan to update Dr. Shon Baton on his recent testing.  3. Essential HTN - blood pressure controlled on recheck. BP on L 126/80, R 124/82. He also has been monitoring at home. No changes made today.  4. Low grade fever - patient reports PM temperature elevation to peak 100*F occurring every day. No localizing symptoms. I have asked him to request an appointment with his primary care for evaluation and asked him to make sure Dr. Katrinka Blazing, his  surgeon is aware, in case this impacts surgical planning. I will obtain basic labs today of BMET, CBC, blood cultures x 2 and 2V CXR.   Disposition: F/u with Dr. Katrinka Blazing in 1 year.  Medication Adjustments/Labs and Tests Ordered: Current medicines are reviewed at length with the patient today.  Concerns regarding medicines are outlined above. Medication changes, Labs and Tests ordered today are summarized above and listed in the Patient Instructions accessible in Encounters.   Signed, Laurann Montana, PA-C  07/04/2021 9:22 AM    [facility] [address] [place], [place], [place]  [address] Phone: [phone]; Fax: [phone]

## 2021-07-04 ENCOUNTER — Other Ambulatory Visit: Payer: Self-pay

## 2021-07-04 ENCOUNTER — Ambulatory Visit
Admission: RE | Admit: 2021-07-04 | Discharge: 2021-07-04 | Disposition: A | Discharge status: Home / Self Care | Payer: Medicare Other | Source: Ambulatory Visit | Attending: Physician Assistant | Admitting: Physician Assistant

## 2021-07-04 ENCOUNTER — Encounter: Payer: Self-pay | Admitting: Physician Assistant

## 2021-07-04 ENCOUNTER — Ambulatory Visit: Payer: Medicare Other | Admitting: Physician Assistant

## 2021-07-04 VITALS — BP 124/82 | HR 68 | Ht 69.0 in | Wt 153.0 lb

## 2021-07-04 DIAGNOSIS — I10 Essential (primary) hypertension: Secondary | ICD-10-CM | POA: Diagnosis not present

## 2021-07-04 DIAGNOSIS — R42 Dizziness and giddiness: Secondary | ICD-10-CM | POA: Diagnosis not present

## 2021-07-04 DIAGNOSIS — E785 Hyperlipidemia, unspecified: Secondary | ICD-10-CM

## 2021-07-04 DIAGNOSIS — I2510 Atherosclerotic heart disease of native coronary artery without angina pectoris: Secondary | ICD-10-CM

## 2021-07-04 DIAGNOSIS — R509 Fever, unspecified: Secondary | ICD-10-CM | POA: Diagnosis not present

## 2021-07-04 DIAGNOSIS — I779 Disorder of arteries and arterioles, unspecified: Secondary | ICD-10-CM

## 2021-07-04 DIAGNOSIS — M4722 Other spondylosis with radiculopathy, cervical region: Secondary | ICD-10-CM | POA: Diagnosis not present

## 2021-07-04 MED ORDER — ATORVASTATIN CALCIUM 40 MG PO TABS: 40.0000 mg | ORAL_TABLET | Freq: Every day | ORAL | 3 refills | Status: DC

## 2021-07-04 NOTE — Patient Instructions (Addendum)
Medication Instructions:  Your physician recommends that you continue on your current medications as directed. Please refer to the Current Medication list given to you today.  *If you need a refill on your cardiac medications before your next appointment, please call your pharmacy*   Lab Work: Go to [facility] on 1st floor:  take a Left off the elevator and take a left down the hall and [facility] is down on the right for:  BLOOD CULTURES X'S 2, BMET, & CBC  If you have labs (blood work) drawn today and your tests are completely normal, you will receive your results only by: MyChart Message (if you have MyChart) OR A paper copy in the mail If you have any lab test that is abnormal or we need to change your treatment, we will call you to review the results.   Testing/Procedures: A chest x-ray takes a picture of the organs and structures inside the chest, including the heart, lungs, and blood vessels. This test can show several things, including, whether the heart is enlarges; whether fluid is building up in the lungs; and whether pacemaker / defibrillator leads are still in place. Go to [facility], TODAY [address] [place], [place] [address] [phone]    Follow-Up: At [facility], you and your health needs are our priority.  As part of our continuing mission to provide you with exceptional heart care, we have created designated Provider Care Teams.  These Care Teams include your primary Cardiologist (physician) and Advanced Practice Providers (APPs -  Physician Assistants and Nurse Practitioners) who all work together to provide you with the care you need, when you need it.  We recommend signing up for the patient portal called "MyChart".  Sign up information is provided on this After Visit Summary.  MyChart is used to connect with patients for Virtual Visits (Telemedicine).  Patients are able to view lab/test results, encounter notes, upcoming appointments,  etc.  Non-urgent messages can be sent to your provider as well.   To learn more about what you can do with MyChart, go to [email or web address].    Your next appointment:   12 month(s)  The format for your next appointment:   In Person  Provider:   You may see Lesleigh Noe, MD3 or one of the following Advanced Practice Providers on your designated Care Team:   Ronie Spies, [place]   Other Instructions

## 2021-07-04 NOTE — Progress Notes (Signed)
Pt has been made aware of normal result and verbalized understanding.  jw

## 2021-07-05 ENCOUNTER — Ambulatory Visit: Payer: Self-pay | Admitting: Orthopedic Surgery

## 2021-07-05 DIAGNOSIS — M5412 Radiculopathy, cervical region: Secondary | ICD-10-CM | POA: Diagnosis not present

## 2021-07-05 DIAGNOSIS — G959 Disease of spinal cord, unspecified: Secondary | ICD-10-CM | POA: Diagnosis not present

## 2021-07-05 NOTE — Progress Notes (Signed)
Surgical Instructions    Your procedure is scheduled on Wednesday, November 9th, 2022.   Report to [facility] Main Entrance "A" at 10:00 A.M., then check in with the Admitting office.  Call this number if you have problems the morning of surgery:  [phone]   If you have any questions prior to your surgery date call [phone]: Open Monday-Friday 8am-4pm    Remember:  Do not eat after midnight the night before your surgery  You may drink clear liquids until 09:00 the morning of your surgery.   Clear liquids allowed are: Water, Non-Citrus Juices (without pulp), Carbonated Beverages, Clear Tea, Black Coffee ONLY (NO MILK, CREAM OR POWDERED CREAMER of any kind), and Gatorade    Take these medicines the morning of surgery with A SIP OF WATER:  amLODipine (NORVASC)  atorvastatin (LIPITOR) buPROPion (WELLBUTRIN XL) labetalol (NORMODYNE) levothyroxine (SYNTHROID)  omeprazole (PRILOSEC) oxycodone (ROXICODONE)  If needed:  nitroGLYCERIN (NITROSTAT)  EPINEPHrine   Follow your surgeon's instructions on when to stop Aspirin.  If no instructions were given by your surgeon then you will need to call the office to get those instructions.     As of today, STOP taking any Aspirin (unless otherwise instructed by your surgeon) Aleve, Naproxen, Ibuprofen, Motrin, Advil, Goody's, BC's, all herbal medications, fish oil, and all vitamins.    After your COVID test   You are not required to quarantine however you are required to wear a well-fitting mask when you are out and around people not in your household.  If your mask becomes wet or soiled, replace with a new one.  Wash your hands often with soap and water for 20 seconds or clean your hands with an alcohol-based hand sanitizer that contains at least 60% alcohol.  Do not share personal items.  Notify your provider: if you are in close contact with someone who has COVID  or if you develop a fever of 100.4 or greater, sneezing, cough,  sore throat, shortness of breath or body aches.    The day of surgery:          Do not wear jewelry  Do not wear lotions, powders, colognes, or deodorant. Men may shave face and neck. Do not bring valuables to the hospital.              [facility] is not responsible for any belongings or valuables.  Do NOT Smoke (Tobacco/Vaping)  24 hours prior to your procedure  If you use a CPAP at night, you may bring your mask for your overnight stay.   Contacts, glasses, hearing aids, dentures or partials may not be worn into surgery, please bring cases for these belongings   For patients admitted to the hospital, discharge time will be determined by your treatment team.   Patients discharged the day of surgery will not be allowed to drive home, and someone needs to stay with them for 24 hours.  NO VISITORS WILL BE ALLOWED IN PRE-OP WHERE PATIENTS ARE PREPPED FOR SURGERY.  ONLY 1 SUPPORT PERSON MAY BE PRESENT IN THE WAITING ROOM WHILE YOU ARE IN SURGERY.  IF YOU ARE TO BE ADMITTED, ONCE YOU ARE IN YOUR ROOM YOU WILL BE ALLOWED TWO (2) VISITORS. 1 (ONE) VISITOR MAY STAY OVERNIGHT BUT MUST ARRIVE TO THE ROOM BY 8pm.  Minor children may have two parents present. Special consideration for safety and communication needs will be reviewed on a case by case basis.  Special instructions:    Oral Hygiene is also  important to reduce your risk of infection.  Remember - BRUSH YOUR TEETH THE MORNING OF SURGERY WITH YOUR REGULAR TOOTHPASTE   [place]- Preparing For Surgery  Before surgery, you can play an important role. Because skin is not sterile, your skin needs to be as free of germs as possible. You can reduce the number of germs on your skin by washing with CHG (chlorahexidine gluconate) Soap before surgery.  CHG is an antiseptic cleaner which kills germs and bonds with the skin to continue killing germs even after washing.     Please do not use if you have an allergy to CHG or antibacterial soaps.  If your skin becomes reddened/irritated stop using the CHG.  Do not shave (including legs and underarms) for at least 48 hours prior to first CHG shower. It is OK to shave your face.  Please follow these instructions carefully.     Shower the NIGHT BEFORE SURGERY and the MORNING OF SURGERY with CHG Soap.   If you chose to wash your hair, wash your hair first as usual with your normal shampoo. After you shampoo, rinse your hair and body thoroughly to remove the shampoo.  Then [employer] and genitals (private parts) with your normal soap and rinse thoroughly to remove soap.  After that Use CHG Soap as you would any other liquid soap. You can apply CHG directly to the skin and wash gently with a scrungie or a clean washcloth.   Apply the CHG Soap to your body ONLY FROM THE NECK DOWN.  Do not use on open wounds or open sores. Avoid contact with your eyes, ears, mouth and genitals (private parts). Wash Face and genitals (private parts)  with your normal soap.   Wash thoroughly, paying special attention to the area where your surgery will be performed.  Thoroughly rinse your body with warm water from the neck down.  DO NOT shower/wash with your normal soap after using and rinsing off the CHG Soap.  Pat yourself dry with a CLEAN TOWEL.  Wear CLEAN PAJAMAS to bed the night before surgery  Place CLEAN SHEETS on your bed the night before your surgery  DO NOT SLEEP WITH PETS.   Day of Surgery:  Take a shower with CHG soap. Wear Clean/Comfortable clothing the morning of surgery Do not apply any deodorants/lotions.   Remember to brush your teeth WITH YOUR REGULAR TOOTHPASTE.   Please read over the following fact sheets that you were given.

## 2021-07-05 NOTE — H&P (Signed)
Subjective:   ACDF C3-5 CONE 07/13/21  Patient Active Problem List   Diagnosis Date Noted   Vertebrobasilar insufficiency 04/08/2018   Syncope 03/26/2018   CAD (coronary artery disease) 03/25/2018   Stenosis of right subclavian artery (HCC) 03/20/2018   Coronary artery calcification seen on CAT scan 03/12/2018   Bilateral carotid bruits 02/27/2018   Mixed hyperlipidemia 12/27/2017   Tobacco abuse 12/27/2017   HTN (hypertension) 10/05/2011   Chest pain 10/04/2011   Bipolar 1 disorder (HCC) 10/04/2011   Graves' disease 10/04/2011   Past Medical History:  Diagnosis Date   Arthritis    neck and knee   Barrett's esophagus    Bipolar 1 disorder (HCC)    CAD in native artery    Cervical disc syndrome    Depression    GERD (gastroesophageal reflux disease)    Grave's disease    Graves' disease 10/04/2011   H/O prostatitis    Hiatal hernia    Hypertension    Lumbar degenerative disc disease    Mild carotid artery disease (HCC)    Mixed hyperlipidemia 12/27/2017   Stenosis of left vertebrobasilar artery    Stenosis of right subclavian artery (HCC)    Tobacco abuse 12/27/2017    Past Surgical History:  Procedure Laterality Date   CARDIAC CATHETERIZATION  2000   CHOLECYSTECTOMY  12/11/2013   DR Andrey Campanile    CHOLECYSTECTOMY N/A 12/11/2013   Procedure: LAPAROSCOPIC CHOLECYSTECTOMY WITH INTRAOPERATIVE CHOLANGIOGRAM;  Surgeon: Atilano Ina, MD;  Location: [facility] OR;  Service: General;  Laterality: N/A;   IR ANGIO INTRA EXTRACRAN SEL COM CAROTID INNOMINATE BILAT MOD SED  05/02/2018   IR ANGIO VERTEBRAL SEL VERTEBRAL BILAT MOD SED  05/02/2018   KNEE SURGERY     LEFT HEART CATH AND CORONARY ANGIOGRAPHY N/A 03/12/2018   Procedure: LEFT HEART CATH AND CORONARY ANGIOGRAPHY;  Surgeon: Lyn Records, MD;  Location: [facility] INVASIVE CV LAB;  Service: Cardiovascular;  Laterality: N/A;   testicular torsion surgery     THYROIDECTOMY      Current Outpatient Medications  Medication Sig Dispense Refill  Last Dose   amLODipine (NORVASC) 5 MG tablet Take 5 mg by mouth daily.      aspirin EC 81 MG tablet Take 81 mg by mouth daily.      atorvastatin (LIPITOR) 40 MG tablet Take 1 tablet (40 mg total) by mouth daily. TAKE 1 TABLET BY MOUTH ONCE DAILY 90 tablet 3    buPROPion (WELLBUTRIN XL) 150 MG 24 hr tablet Take 450 mg by mouth daily.      Cholecalciferol (VITAMIN D) 50 MCG (2000 UT) tablet Take 2,000 Units by mouth daily.      doxazosin (CARDURA) 4 MG tablet Take 4 mg by mouth 2 (two) times daily.      EPINEPHrine 0.3 mg/0.3 mL IJ SOAJ injection Inject 0.3 mg into the muscle as needed for anaphylaxis. 1 each 0    labetalol (NORMODYNE) 300 MG tablet Take 300 mg by mouth 2 (two) times daily.      levothyroxine (SYNTHROID) 150 MCG tablet Take 150 mcg by mouth daily before breakfast.      lisinopril (PRINIVIL,ZESTRIL) 10 MG tablet Take 10 mg by mouth at bedtime.       nitroGLYCERIN (NITROSTAT) 0.4 MG SL tablet Place 0.4 mg under the tongue every 5 (five) minutes as needed for chest pain.      omeprazole (PRILOSEC) 20 MG capsule Take 20 mg by mouth daily.      oxycodone (ROXICODONE)  30 MG immediate release tablet Take 30 mg by mouth in the morning, at noon, and at bedtime.      QUEtiapine (SEROQUEL) 100 MG tablet Take 300 mg by mouth at bedtime.      No current facility-administered medications for this visit.   Allergies  Allergen Reactions   Bee Venom Anaphylaxis    Social History   Tobacco Use   Smoking status: Former    Packs/day: 0.25    Years: 35.00    Pack years: 8.75    Types: Cigarettes    Quit date: 01/28/2015    Years since quitting: 6.4   Smokeless tobacco: Never  Substance Use Topics   Alcohol use: No    Family History  Problem Relation Age of Onset   Hypertension Mother    Hypertension Father    Valvular heart disease Father    Cancer Other    Hypertension Other     Review of Systems Pertinent items are noted in HPI.  Objective:   Vitals: Ht: 5 ft 9 in  07/05/2021 01:07 pm BP: 118/90 07/05/2021 01:12 pm Pulse: 78 bpm 07/05/2021 01:10 pm  General: AAOx3, NAD Inspection: Previous midline horizontal incision for thyroid surgery Heart: RRR, no rubs, murmers, or gallopss Lungs: CTAB Abdomen: BSx4, non-tender, non-distended, no rebound tenderness. No loss of bladder/bowel control. Neuro:Rumaldo continues to have neck pain. No C7 left arm pain today. Negative Babinski test, negative Hoffman test, positive numbness and dysesthesias into the left and right upper extremity left worse than the right. Symmetrical 2+ deep tendon reflexes in the upper extremity, brisk 3+ deep tendon reflexes in the lower extremity absent at the Achilles. Negative inverted brachioradialis reflex. Patient has some difficulty with heel toe ambulation and notes occasional loss in balance. Negative Romberg's test. Cervical MRI: completed on 04/11/21 was reviewed with the patient. It was completed at [facility]; I have independently reviewed the images as well as the radiology report. Positive myelomalacia with T2 cord signal changes C3-4 and C4-5. Core changes at C4-5 appear to be new compared to her prior MRI. Moderate to severe stenosis C3-5. Progression in the right lateral recess stenosis at both levels. Moderate C6-7 stenosis. Impingement on the left C7 nerve root due to foraminal stenosis unchanged. Mild to moderate degenerative changes at C5-6. There is also slight anterior listhesis at C3-4.  Assessment:   Sherman's a very pleasant 60 year old gentleman with severe neck pain and early signs of myelopathy. Primarily his gait disturbance and he does note occasional difficulty with fine motor activity. While he has a negative Babinski test/Hoffman test his MRI clearly shows cord signal changes and severe stenosis. The patient's intermittent left radicular arm pain most likely is due to the impingement of the left C7 nerve root. Treatment plan: I have gone over the MRI with him in great  detail. At this point my primary concern is the myelopathy C3-5. This can be addressed with a 2 level ACDF. However the goal of this surgery is to prevent worsening of the myelopathy not improvement. He may continue to have the left radicular arm pain that could require a posterior C7 decompression at some point. I am hesitant to recommend a multilevel ACDF at this time. This would significantly reduce his neck range of motion and complicate his overall recovery. I believe the 2 level ACF fluid address the immediate problem of cervical myelopathy and we can treat the C7 left radicular arm pain with injection therapy. Currently he does not demonstrate any focal motor deficits  or progression in the C7 radiculopathy.   Plan:   C3-5 ACDF with zero profile cages Risks and benefits of surgery were discussed with the patient. These include: Infection, bleeding, death, stroke, paralysis, ongoing or worse pain, need for additional surgery, nonunion, leak of spinal fluid, adjacent segment degeneration requiring additional fusion surgery. Pseudoarthrosis (nonunion)requiring supplemental posterior fixation. Throat pain, swallowing difficulties, hoarseness or change in voice. With respect to disc replacement: Additional risks include heterotopic ossification, inability to place the disc due to technical issues requiring bailout to a fusion procedure. I have given the patient clearance form to take to his primary care, neurologist, and cardiologist providers. I reviewed the patient's medication list. He is not on any blood thinners. He does use a baby aspirin daily. We will plan to hold this 7 days prior to surgery and resume 48 hours after. He is not taking any anti-inflammatories. We have also discussed the post-operative recovery period to include: bathing/showering restrictions, wound healing, activity (and driving) restrictions, medications/pain mangement. We have also discussed post-operative redflags to include:  signs and symptoms of postoperative infection, DVT/PE. Patient with a palpable discharge instructions which were reviewed with him today. We will need to get Aspen collar Follow-up: 2 weeks postop neck pain

## 2021-07-06 ENCOUNTER — Other Ambulatory Visit: Payer: Self-pay

## 2021-07-06 ENCOUNTER — Encounter (HOSPITAL_COMMUNITY): Payer: Self-pay

## 2021-07-06 ENCOUNTER — Encounter (HOSPITAL_COMMUNITY)
Admission: RE | Admit: 2021-07-06 | Discharge: 2021-07-06 | Disposition: A | Discharge status: Home / Self Care | Payer: Medicare Other | Source: Ambulatory Visit | Attending: Orthopedic Surgery | Admitting: Orthopedic Surgery

## 2021-07-06 VITALS — BP 129/77 | HR 73 | Temp 98.3°F | Resp 17 | Ht 69.0 in | Wt 154.0 lb

## 2021-07-06 DIAGNOSIS — Z01818 Encounter for other preprocedural examination: Secondary | ICD-10-CM

## 2021-07-06 DIAGNOSIS — Z01812 Encounter for preprocedural laboratory examination: Secondary | ICD-10-CM | POA: Insufficient documentation

## 2021-07-06 HISTORY — DX: Headache, unspecified: R51.9

## 2021-07-06 HISTORY — DX: Anxiety disorder, unspecified: F41.9

## 2021-07-06 LAB — TYPE AND SCREEN
ABO/RH(D): B POS
Antibody Screen: NEGATIVE

## 2021-07-06 LAB — SURGICAL PCR SCREEN
MRSA, PCR: NEGATIVE
Staphylococcus aureus: POSITIVE — AB

## 2021-07-06 NOTE — Progress Notes (Signed)
PCP - Dr. Pete Glatter Cardiologist - Dr. Verdis Prime  PPM/ICD - n/a  Chest x-ray - 07/04/21 EKG - 05/17/21 Stress Test - 2003 ECHO - 01/02/18 Cardiac Cath - 03/12/18  Sleep Study - denies CPAP - n/a  Fasting Blood Sugar - n/a Checks Blood Sugar _____ times a day- n/a  Blood Thinner Instructions: n/a Aspirin Instructions: Patient states he was instructed by Dr. Shon Baton to stop taking Aspirin 7 days before surgery. Patient states he took his last dose on 07/05/21.   ERAS Protcol - Yes PRE-SURGERY Ensure or G2- No  COVID TEST- Scheduled for 07/11/21. Patient is aware of date, time, and location.    Anesthesia review: Yes. Cardiac History  Patient denies shortness of breath, fever, cough and chest pain at PAT appointment   All instructions explained to the patient, with a verbal understanding of the material. Patient agrees to go over the instructions while at home for a better understanding. Patient also instructed to self quarantine after being tested for COVID-19. The opportunity to ask questions was provided.

## 2021-07-07 DIAGNOSIS — I10 Essential (primary) hypertension: Secondary | ICD-10-CM | POA: Diagnosis not present

## 2021-07-07 DIAGNOSIS — M4722 Other spondylosis with radiculopathy, cervical region: Secondary | ICD-10-CM | POA: Diagnosis not present

## 2021-07-07 DIAGNOSIS — I209 Angina pectoris, unspecified: Secondary | ICD-10-CM | POA: Diagnosis not present

## 2021-07-07 DIAGNOSIS — M4712 Other spondylosis with myelopathy, cervical region: Secondary | ICD-10-CM | POA: Diagnosis not present

## 2021-07-07 DIAGNOSIS — E781 Pure hyperglyceridemia: Secondary | ICD-10-CM | POA: Diagnosis not present

## 2021-07-07 DIAGNOSIS — E039 Hypothyroidism, unspecified: Secondary | ICD-10-CM | POA: Diagnosis not present

## 2021-07-07 NOTE — Progress Notes (Addendum)
Anesthesia Chart Review:  Case: [id number] Date/Time: 07/13/21 1400   Procedure: ANTERIOR CERVICAL DECOMPRESSION/DISCECTOMY FUSION 2 LEVELS (ACDF C3-5)   Anesthesia type: General   Pre-op diagnosis: Cervical myelopathy   Location: [facility] OR ROOM 04 / [facility] OR   Surgeons: Melina Schools, MD       DISCUSSION: Patient is a 60 year old male scheduled for the above procedure.  History includes former smoker (quit 05/05/21), CAD (MI 2000; 50-60% dCx & pD1, 20-30% prox-distal RCA, luminal irregularities dLAD, medical therapy 03/12/18), HTN, HLD, carotid artery disease (65-70% left vertebrobasilar junction by angiography 05/02/18; 1-39% BICA, antegrade VA, normal flow dynamics bilateral SCAs 05/24/21 [place]; stable CTA head/neck 06/03/21), GERD, hiatal hernia, Grave's disease (s/p thyroidectomy 05/22/02; post-surgical hypothyroidism), Bipolar 1 disorder, cholecystectomy (12/11/13).   Last cardiology visit 07/04/2021 by Melina Copa, PA-C. She had initially seen him for preoperative evaluation on 05/17/21 and wrote, "Preop cardiovascular examination - clinically the patient is doing well without any progressive angina. He has been able to exert >4 METS working out in the yard for hours without angina or dyspnea. I do not think he requires further ischemic testing prior to surgery"; However, she wanted him to come back for BP check and to re-evaluate carotid/vertebrobasilar disease per IR given some reports of dizziness. Dr. Estanislado Pandy ordered a CTA head/neck which showed stable disease since 2019 and reportedly not felt to be the cause of his dizziness. Cervical myelopathy possibly contributing. At 07/04/21 cardiology follow-up, no bradycardia with head turn/arm raise. BP 142/82 and repeat L 126/80, R 124/82. Of note, Dayna Dunn did order labs and CXR due to reported intermittent low grade temperature for ~ 3 weeks, possible exposure to RSV but with no localizing symptoms. She advised to follow-up with PCP and update Dr. Rolena Infante. 07/04/21  blood cultures are negative to date, CBC normal, BMET unremarkable. CXR showed no active cardiopulmonary disease.  Appears he had visit at [place] on 07/04/21, records requested.   He reported instructions to hold aspirin 7 days prior to surgery.  Last dose 07/05/2021.  Preoperative COVID-19 testing is scheduled for 07/11/2021.  Anesthesia team to evaluate on the day of surgery. (UPDATE 07/12/21 2:03 PM: 07/04/21 office note from [facility]. Televisit with Dr. Leeroy Cha. Reviewed cardiology work-up. He added UA which showed trace blood, but was otherwise normal. No additional testing ordered. 07/04/21 blood cultures were negative. I did speak with Judeen Hammans at Dr. Rolena Infante' office on 07/08/21 about recent testing results ordered by cardiology for low grade fever. 07/11/21 COVID-19 test negative.)    VS: BP 129/77   Pulse 73   Temp 36.8 C (Oral)   Resp 17   Ht 5\' 9"  (1.753 m)   Wt 69.9 kg   SpO2 100%   BMI 22.74 kg/m    PROVIDERS: Lajean Manes, MD is PCP  Daneen Schick, MD is cardiologist Luanne Bras, MD is IR   LABS:  Lab Results  Component Value Date   WBC 7.4 07/04/2021   HGB 13.3 07/04/2021   HCT 38.2 07/04/2021   PLT 251 07/04/2021   GLUCOSE 118 (H) 07/04/2021   NA 142 07/04/2021   K 4.2 07/04/2021   CL 102 07/04/2021   CREATININE 0.89 07/04/2021   BUN 20 07/04/2021   CO2 24 07/04/2021   TSH 1.160 05/17/2021     IMAGES: CXR 07/04/21: FINDINGS: - Cardiomediastinal silhouette unchanged in size and contour. No evidence of central vascular congestion. No interlobular septal thickening. - No pneumothorax or pleural effusion. Coarsened interstitial markings, with no  confluent airspace disease. - No acute displaced fracture. Degenerative changes of the spine. - Surgical changes at the base of the neck IMPRESSION: No active cardiopulmonary disease.  CT Head & CTA head/neck 06/03/21: CT HEAD FINDINGS - Brain: No evidence of acute or interval infarction,  hemorrhage, hydrocephalus, extra-axial collection or mass lesion/mass effect. - Vascular: No hyperdense vessel or unexpected calcification. - Skull: Normal. Negative for fracture or focal lesion. - Sinuses: Negative - Orbits: Negative   CTA NECK FINDINGS - Aortic arch: Atheromatous plaque with 3 vessel branching. - Right carotid system: Scattered calcified and noncalcified plaque along the common and proximal ICA without flow limiting stenosis or ulceration. - Left carotid system: Scattered atheromatous plaque of mixed density along the common and internal carotid without flow limiting stenosis or ulceration. - Vertebral arteries: No proximal subclavian stenosis. Mild calcified plaque at the right V1 segment. Mildly dominant right vertebral artery. No flow limiting stenosis, beading, or dissection - Skeleton: Ordinary cervical spine degeneration. - Other neck: Prominence of palatine tonsils without asymmetry or change from 2019. Tonsillectomy - Upper chest: Mild emphysema  CTA HEAD FINDINGS - Anterior circulation: Atheromatous calcification of carotid siphons. No flow limiting stenosis or interval branch occlusion. No beading or aneurysm. - Posterior circulation: Calcified plaque on the V4 segments without noted progression, rather the patency of the vertebral arteries appears better, likely due to improved bolus density. On the left there is at least 50% narrowing just proximal to the PICA origin. Mild atheromatous irregularity of PCA vessels best seen on sagittal MIPS. No branch occlusion, beading, or aneurysm. - Venous sinuses: Negative - Anatomic variants: None significant  IMPRESSION: Carotid and vertebral atherosclerosis without noted progression from 2019. Also stable brain with no evidence of interval infarction.    EKG: 05/17/21: NSR. Baseline artifact worse in leads I, aVL, III.   CV: US Carotid 05/24/21: Summary:  - Right Carotid: Velocities in the right ICA  are consistent with a 1-39%  stenosis.  - Left Carotid: Velocities in the left ICA are consistent with a 1-39%  stenosis.  - Vertebrals:  Bilateral vertebral arteries demonstrate antegrade flow.  - Subclavians: Normal flow hemodynamics were seen in bilateral subclavian arteries.    Cardiac cath 03/12/18: Three-vessel mild to moderate nonobstructive coronary disease with 50 to 60% distal circumflex, 50 to 60% proximal first diagonal, 20 to 30% proximal to distal RCA, and luminal irregularities in the proximal to distal LAD. Normal left ventricular size and function.  Estimated ejection fraction 65%. Acticin coronary calcium score greater than 400, documented by previous coronary CTA   RECOMMENDATIONS:  Aggressive risk factor modification: LDL less than 70 and perhaps pushing to 50, < blood pressure 130/80 mmHg, monitor hemoglobin A1c and maintain less than 7, aspirin 81 mg/day, and 150 minutes of moderate aerobic activity per week    Echo 01/02/18: Study Conclusions  - Left ventricle: The cavity size was normal. Wall thickness was    normal. Systolic function was normal. The estimated ejection    fraction was in the range of 55% to 60%. Wall motion was normal;    there were no regional wall motion abnormalities. Left    ventricular diastolic function parameters were normal.  - Aortic valve: Trileaflet; mildly calcified leaflets. Sclerosis    without stenosis. Mean gradient (S): 9 mm Hg.  - Mitral valve: There was trivial regurgitation.  - Left atrium: The atrium was mildly dilated.  - Right ventricle: The cavity size was normal. Systolic function    was normal.  -  Tricuspid valve: Peak RV-RA gradient (S): 30 mm Hg.  - Pulmonary arteries: PA peak pressure: 33 mm Hg (S).  - Inferior vena cava: The vessel was normal in size. The    respirophasic diameter changes were in the normal range (>= 50%),    consistent with normal central venous pressure.  Impressions:  - Normal LV size with EF  55-60%. Normal diastolic function. Normal    RV size and systolic function. Aortic valve sclerosis without    significant stenosis.    Past Medical History:  Diagnosis Date   Anxiety    Arthritis    neck and knee   Barrett's esophagus    Bipolar 1 disorder ([place])    CAD in native artery    Cervical disc syndrome    Depression    GERD (gastroesophageal reflux disease)    Grave's disease    Graves' disease 10/04/2011   H/O prostatitis    Headache    Hiatal hernia    Hypertension    Lumbar degenerative disc disease    Mild carotid artery disease (HCC)    Mixed hyperlipidemia 12/27/2017   Myocardial infarction ([place]) 2000   Mild heart attack   Stenosis of left vertebrobasilar artery    Stenosis of right subclavian artery (HCC)    Tobacco abuse 12/27/2017    Past Surgical History:  Procedure Laterality Date   CARDIAC CATHETERIZATION  2000   CHOLECYSTECTOMY  12/11/2013   DR Redmond Pulling    CHOLECYSTECTOMY N/A 12/11/2013   Procedure: LAPAROSCOPIC CHOLECYSTECTOMY WITH INTRAOPERATIVE CHOLANGIOGRAM;  Surgeon: Gayland Curry, MD;  Location: [facility] OR;  Service: General;  Laterality: N/A;   IR ANGIO INTRA EXTRACRAN SEL COM CAROTID INNOMINATE BILAT MOD SED  05/02/2018   IR ANGIO VERTEBRAL SEL VERTEBRAL BILAT MOD SED  05/02/2018   KNEE SURGERY     LEFT HEART CATH AND CORONARY ANGIOGRAPHY N/A 03/12/2018   Procedure: LEFT HEART CATH AND CORONARY ANGIOGRAPHY;  Surgeon: Belva Crome, MD;  Location: [place] CV LAB;  Service: Cardiovascular;  Laterality: N/A;   testicular torsion surgery     THYROIDECTOMY      MEDICATIONS:  amLODipine (NORVASC) 5 MG tablet   aspirin EC 81 MG tablet   atorvastatin (LIPITOR) 40 MG tablet   buPROPion (WELLBUTRIN XL) 150 MG 24 hr tablet   Cholecalciferol (VITAMIN D) 50 MCG (2000 UT) tablet   doxazosin (CARDURA) 4 MG tablet   EPINEPHrine 0.3 mg/0.3 mL IJ SOAJ injection   labetalol (NORMODYNE) 300 MG tablet   levothyroxine (SYNTHROID) 150 MCG tablet   lisinopril  (PRINIVIL,ZESTRIL) 10 MG tablet   nitroGLYCERIN (NITROSTAT) 0.4 MG SL tablet   omeprazole (PRILOSEC) 20 MG capsule   oxycodone (ROXICODONE) 30 MG immediate release tablet   QUEtiapine (SEROQUEL) 100 MG tablet   No current facility-administered medications for this encounter.    Myra Gianotti, PA-C Surgical Short Stay/Anesthesiology [facility] Phone [phone] [facility] Phone [phone] 07/07/2021 5:10 PM

## 2021-07-10 LAB — CBC
Hematocrit: 38.2 % (ref 37.5–51.0)
Hemoglobin: 13.3 g/dL (ref 13.0–17.7)
MCH: 30.6 pg (ref 26.6–33.0)
MCHC: 34.8 g/dL (ref 31.5–35.7)
MCV: 88 fL (ref 79–97)
Platelets: 251 10*3/uL (ref 150–450)
RBC: 4.34 x10E6/uL (ref 4.14–5.80)
RDW: 11.6 % (ref 11.6–15.4)
WBC: 7.4 10*3/uL (ref 3.4–10.8)

## 2021-07-10 LAB — BASIC METABOLIC PANEL
BUN/Creatinine Ratio: 22 — ABNORMAL HIGH (ref 9–20)
BUN: 20 mg/dL (ref 6–24)
CO2: 24 mmol/L (ref 20–29)
Calcium: 9.3 mg/dL (ref 8.7–10.2)
Chloride: 102 mmol/L (ref 96–106)
Creatinine, Ser: 0.89 mg/dL (ref 0.76–1.27)
Glucose: 118 mg/dL — ABNORMAL HIGH (ref 70–99)
Potassium: 4.2 mmol/L (ref 3.5–5.2)
Sodium: 142 mmol/L (ref 134–144)
eGFR: 99 mL/min/{1.73_m2} (ref >59)

## 2021-07-10 LAB — CULTURE, BLOOD (SINGLE)

## 2021-07-11 ENCOUNTER — Other Ambulatory Visit (HOSPITAL_COMMUNITY)
Admission: RE | Admit: 2021-07-11 | Discharge: 2021-07-11 | Disposition: A | Discharge status: Home / Self Care | Payer: Medicare Other | Source: Ambulatory Visit | Attending: Orthopedic Surgery | Admitting: Orthopedic Surgery

## 2021-07-11 DIAGNOSIS — Z01818 Encounter for other preprocedural examination: Secondary | ICD-10-CM

## 2021-07-11 DIAGNOSIS — Z20822 Contact with and (suspected) exposure to covid-19: Secondary | ICD-10-CM | POA: Insufficient documentation

## 2021-07-11 DIAGNOSIS — Z01812 Encounter for preprocedural laboratory examination: Secondary | ICD-10-CM | POA: Insufficient documentation

## 2021-07-11 LAB — SARS CORONAVIRUS 2 (TAT 6-24 HRS): SARS Coronavirus 2: NEGATIVE

## 2021-07-11 NOTE — Progress Notes (Signed)
Pt has been made aware of normal result and verbalized understanding.  jw

## 2021-07-12 NOTE — Anesthesia Preprocedure Evaluation (Addendum)
Anesthesia Evaluation  Patient identified by MRN, date of birth, ID band Patient awake    Reviewed: Allergy & Precautions, NPO status , Patient's Chart, lab work & pertinent test results  Airway Mallampati: II  TM Distance: >3 FB Neck ROM: Full    Dental  (+) Dental Advisory Given   Pulmonary former smoker,    breath sounds clear to auscultation       Cardiovascular hypertension, Pt. on medications and Pt. on home beta blockers + CAD, + Past MI and + Peripheral Vascular Disease   Rhythm:Regular Rate:Normal     Neuro/Psych negative neurological ROS     GI/Hepatic Neg liver ROS, hiatal hernia, GERD  ,  Endo/Other  negative endocrine ROS  Renal/GU negative Renal ROS     Musculoskeletal  (+) Arthritis ,   Abdominal   Peds  Hematology negative hematology ROS (+)   Anesthesia Other Findings   Reproductive/Obstetrics                            Lab Results  Component Value Date   WBC 7.4 07/04/2021   HGB 13.3 07/04/2021   HCT 38.2 07/04/2021   MCV 88 07/04/2021   PLT 251 07/04/2021   Lab Results  Component Value Date   CREATININE 0.89 07/04/2021   BUN 20 07/04/2021   NA 142 07/04/2021   K 4.2 07/04/2021   CL 102 07/04/2021   CO2 24 07/04/2021    Anesthesia Physical Anesthesia Plan  ASA: 3  Anesthesia Plan: General   Post-op Pain Management:    Induction: Intravenous  PONV Risk Score and Plan: 2 and Dexamethasone, Ondansetron and Treatment may vary due to age or medical condition  Airway Management Planned: Oral ETT  Additional Equipment: None  Intra-op Plan:   Post-operative Plan: Extubation in OR  Informed Consent: I have reviewed the patients History and Physical, chart, labs and discussed the procedure including the risks, benefits and alternatives for the proposed anesthesia with the patient or authorized representative who has indicated his/her understanding and  acceptance.     Dental advisory given  Plan Discussed with: CRNA  Anesthesia Plan Comments: ( )       Anesthesia Quick Evaluation

## 2021-07-12 NOTE — Progress Notes (Signed)
Patient voiced understanding of new arrival time of 1215 tomorrow.

## 2021-07-13 ENCOUNTER — Ambulatory Visit (HOSPITAL_COMMUNITY): Payer: Medicare Other | Admitting: Vascular Surgery

## 2021-07-13 ENCOUNTER — Ambulatory Visit (HOSPITAL_COMMUNITY): Payer: Medicare Other

## 2021-07-13 ENCOUNTER — Observation Stay (HOSPITAL_COMMUNITY)
Admission: RE | Admit: 2021-07-13 | Discharge: 2021-07-14 | Disposition: A | Discharge status: Home / Self Care | Payer: Medicare Other | Attending: Orthopedic Surgery | Admitting: Orthopedic Surgery

## 2021-07-13 ENCOUNTER — Encounter (HOSPITAL_COMMUNITY)
Admission: RE | Disposition: A | Discharge status: Home / Self Care | Payer: Self-pay | Source: Home / Self Care | Attending: Orthopedic Surgery

## 2021-07-13 ENCOUNTER — Ambulatory Visit (HOSPITAL_COMMUNITY): Payer: Medicare Other | Admitting: Certified Registered Nurse Anesthetist

## 2021-07-13 ENCOUNTER — Encounter (HOSPITAL_COMMUNITY): Payer: Self-pay | Admitting: Orthopedic Surgery

## 2021-07-13 ENCOUNTER — Other Ambulatory Visit: Payer: Self-pay

## 2021-07-13 DIAGNOSIS — M5002 Cervical disc disorder with myelopathy, mid-cervical region, unspecified level: Secondary | ICD-10-CM | POA: Diagnosis not present

## 2021-07-13 DIAGNOSIS — I2510 Atherosclerotic heart disease of native coronary artery without angina pectoris: Secondary | ICD-10-CM | POA: Insufficient documentation

## 2021-07-13 DIAGNOSIS — Z981 Arthrodesis status: Secondary | ICD-10-CM | POA: Diagnosis not present

## 2021-07-13 DIAGNOSIS — E782 Mixed hyperlipidemia: Secondary | ICD-10-CM | POA: Diagnosis not present

## 2021-07-13 DIAGNOSIS — M50021 Cervical disc disorder at C4-C5 level with myelopathy: Secondary | ICD-10-CM | POA: Insufficient documentation

## 2021-07-13 DIAGNOSIS — Z419 Encounter for procedure for purposes other than remedying health state, unspecified: Secondary | ICD-10-CM

## 2021-07-13 DIAGNOSIS — M4712 Other spondylosis with myelopathy, cervical region: Secondary | ICD-10-CM | POA: Diagnosis not present

## 2021-07-13 DIAGNOSIS — Z79899 Other long term (current) drug therapy: Secondary | ICD-10-CM | POA: Diagnosis not present

## 2021-07-13 DIAGNOSIS — I10 Essential (primary) hypertension: Secondary | ICD-10-CM | POA: Diagnosis not present

## 2021-07-13 DIAGNOSIS — G959 Disease of spinal cord, unspecified: Secondary | ICD-10-CM | POA: Diagnosis present

## 2021-07-13 DIAGNOSIS — M5001 Cervical disc disorder with myelopathy,  high cervical region: Principal | ICD-10-CM | POA: Insufficient documentation

## 2021-07-13 DIAGNOSIS — K219 Gastro-esophageal reflux disease without esophagitis: Secondary | ICD-10-CM | POA: Diagnosis not present

## 2021-07-13 DIAGNOSIS — M4322 Fusion of spine, cervical region: Secondary | ICD-10-CM | POA: Diagnosis not present

## 2021-07-13 HISTORY — PX: ANTERIOR CERVICAL DECOMP/DISCECTOMY FUSION: SHX1161

## 2021-07-13 LAB — ABO/RH: ABO/RH(D): B POS

## 2021-07-13 SURGERY — ANTERIOR CERVICAL DECOMPRESSION/DISCECTOMY FUSION 2 LEVELS
Anesthesia: General

## 2021-07-13 MED ORDER — OXYCODONE-ACETAMINOPHEN 10-325 MG PO TABS: 1.0000 | ORAL_TABLET | Freq: Four times a day (QID) | ORAL | 0 refills | Status: AC | PRN

## 2021-07-13 MED ORDER — MIDAZOLAM HCL 2 MG/2ML IJ SOLN
INTRAMUSCULAR | Status: AC
  Filled 2021-07-13: qty 2

## 2021-07-13 MED ORDER — AMLODIPINE BESYLATE 5 MG PO TABS
5.0000 mg | ORAL_TABLET | Freq: Every day | ORAL | Status: DC
  Administered 2021-07-14: 5 mg via ORAL
  Filled 2021-07-13: qty 1

## 2021-07-13 MED ORDER — MENTHOL 3 MG MT LOZG
1.0000 | LOZENGE | OROMUCOSAL | Status: DC | PRN
  Filled 2021-07-13: qty 9

## 2021-07-13 MED ORDER — DEXAMETHASONE SODIUM PHOSPHATE 10 MG/ML IJ SOLN
INTRAMUSCULAR | Status: DC | PRN
Start: 2021-07-13 — End: 2021-07-13
  Administered 2021-07-13: 10 mg via INTRAVENOUS

## 2021-07-13 MED ORDER — SURGIFLO WITH THROMBIN (HEMOSTATIC MATRIX KIT) OPTIME
TOPICAL | Status: DC | PRN
  Administered 2021-07-13: 1 via TOPICAL

## 2021-07-13 MED ORDER — OXYCODONE HCL 5 MG PO TABS
10.0000 mg | ORAL_TABLET | ORAL | Status: DC | PRN
  Administered 2021-07-14: 10 mg via ORAL
  Filled 2021-07-13 (×2): qty 2

## 2021-07-13 MED ORDER — LEVOTHYROXINE SODIUM 75 MCG PO TABS
150.0000 ug | ORAL_TABLET | Freq: Every day | ORAL | Status: DC
  Administered 2021-07-14: 150 ug via ORAL
  Filled 2021-07-13: qty 2

## 2021-07-13 MED ORDER — MIDAZOLAM HCL 2 MG/2ML IJ SOLN
INTRAMUSCULAR | Status: DC | PRN
  Administered 2021-07-13: 2 mg via INTRAVENOUS

## 2021-07-13 MED ORDER — METHOCARBAMOL 500 MG PO TABS
500.0000 mg | ORAL_TABLET | Freq: Four times a day (QID) | ORAL | Status: DC | PRN
  Administered 2021-07-13: 500 mg via ORAL
  Filled 2021-07-13: qty 1

## 2021-07-13 MED ORDER — ACETAMINOPHEN 650 MG RE SUPP: 650.0000 mg | RECTAL | Status: DC | PRN

## 2021-07-13 MED ORDER — HYDROMORPHONE HCL 1 MG/ML IJ SOLN
0.5000 mg | INTRAMUSCULAR | Status: AC | PRN
  Administered 2021-07-13 (×2): 0.5 mg via INTRAVENOUS
  Filled 2021-07-13 (×2): qty 0.5

## 2021-07-13 MED ORDER — PHENYLEPHRINE HCL-NACL 20-0.9 MG/250ML-% IV SOLN
INTRAVENOUS | Status: DC | PRN
  Administered 2021-07-13: 20 ug/min via INTRAVENOUS

## 2021-07-13 MED ORDER — POLYETHYLENE GLYCOL 3350 17 G PO PACK: 17.0000 g | PACK | Freq: Every day | ORAL | Status: DC | PRN

## 2021-07-13 MED ORDER — ACETAMINOPHEN 325 MG PO TABS
650.0000 mg | ORAL_TABLET | ORAL | Status: DC | PRN
  Administered 2021-07-14: 650 mg via ORAL
  Filled 2021-07-13: qty 2

## 2021-07-13 MED ORDER — NITROGLYCERIN 0.4 MG SL SUBL: 0.4000 mg | SUBLINGUAL_TABLET | SUBLINGUAL | Status: DC | PRN

## 2021-07-13 MED ORDER — ORAL CARE MOUTH RINSE: 15.0000 mL | Freq: Once | OROMUCOSAL | Status: AC

## 2021-07-13 MED ORDER — SODIUM CHLORIDE 0.9% FLUSH: 3.0000 mL | INTRAVENOUS | Status: DC | PRN

## 2021-07-13 MED ORDER — DEXAMETHASONE 4 MG PO TABS
4.0000 mg | ORAL_TABLET | Freq: Four times a day (QID) | ORAL | Status: DC
  Administered 2021-07-14 (×2): 4 mg via ORAL
  Filled 2021-07-13 (×2): qty 1

## 2021-07-13 MED ORDER — ONDANSETRON HCL 4 MG/2ML IJ SOLN
INTRAMUSCULAR | Status: DC | PRN
  Administered 2021-07-13: 4 mg via INTRAVENOUS

## 2021-07-13 MED ORDER — METHOCARBAMOL 500 MG PO TABS: 500.0000 mg | ORAL_TABLET | Freq: Three times a day (TID) | ORAL | 0 refills | Status: AC | PRN

## 2021-07-13 MED ORDER — ONDANSETRON HCL 4 MG/2ML IJ SOLN: 4.0000 mg | Freq: Four times a day (QID) | INTRAMUSCULAR | Status: DC | PRN

## 2021-07-13 MED ORDER — FENTANYL CITRATE (PF) 250 MCG/5ML IJ SOLN
INTRAMUSCULAR | Status: AC
  Filled 2021-07-13: qty 5

## 2021-07-13 MED ORDER — ATORVASTATIN CALCIUM 40 MG PO TABS
40.0000 mg | ORAL_TABLET | Freq: Every day | ORAL | Status: DC
  Administered 2021-07-14: 40 mg via ORAL
  Filled 2021-07-13: qty 1

## 2021-07-13 MED ORDER — QUETIAPINE FUMARATE 300 MG PO TABS
300.0000 mg | ORAL_TABLET | Freq: Every day | ORAL | Status: DC
  Administered 2021-07-13: 300 mg via ORAL
  Filled 2021-07-13 (×2): qty 1

## 2021-07-13 MED ORDER — BUPROPION HCL ER (XL) 300 MG PO TB24
450.0000 mg | ORAL_TABLET | Freq: Every day | ORAL | Status: DC
  Administered 2021-07-14: 450 mg via ORAL
  Filled 2021-07-13: qty 1

## 2021-07-13 MED ORDER — SODIUM CHLORIDE 0.9 % IV SOLN
0.0125 ug/kg/min | INTRAVENOUS | Status: DC
  Filled 2021-07-13: qty 2000

## 2021-07-13 MED ORDER — DOXAZOSIN MESYLATE 4 MG PO TABS
4.0000 mg | ORAL_TABLET | Freq: Two times a day (BID) | ORAL | Status: DC
  Administered 2021-07-13 – 2021-07-14 (×2): 4 mg via ORAL
  Filled 2021-07-13 (×3): qty 1

## 2021-07-13 MED ORDER — CEFAZOLIN SODIUM-DEXTROSE 1-4 GM/50ML-% IV SOLN
1.0000 g | Freq: Three times a day (TID) | INTRAVENOUS | Status: AC
  Administered 2021-07-13 – 2021-07-14 (×2): 1 g via INTRAVENOUS
  Filled 2021-07-13 (×2): qty 50

## 2021-07-13 MED ORDER — PROPOFOL 10 MG/ML IV BOLUS
INTRAVENOUS | Status: DC | PRN
  Administered 2021-07-13: 150 mg via INTRAVENOUS

## 2021-07-13 MED ORDER — PHENOL 1.4 % MT LIQD
1.0000 | OROMUCOSAL | Status: DC | PRN
  Administered 2021-07-13: 1 via OROMUCOSAL
  Filled 2021-07-13: qty 177

## 2021-07-13 MED ORDER — FENTANYL CITRATE (PF) 100 MCG/2ML IJ SOLN: 25.0000 ug | INTRAMUSCULAR | Status: DC | PRN

## 2021-07-13 MED ORDER — LIDOCAINE 2% (20 MG/ML) 5 ML SYRINGE
INTRAMUSCULAR | Status: DC | PRN
  Administered 2021-07-13: 60 mg via INTRAVENOUS

## 2021-07-13 MED ORDER — SODIUM CHLORIDE 0.9 % IV SOLN
250.0000 mL | INTRAVENOUS | Status: DC
  Administered 2021-07-13: 250 mL via INTRAVENOUS

## 2021-07-13 MED ORDER — DEXAMETHASONE SODIUM PHOSPHATE 4 MG/ML IJ SOLN
4.0000 mg | Freq: Four times a day (QID) | INTRAMUSCULAR | Status: DC
  Administered 2021-07-13: 4 mg via INTRAVENOUS
  Filled 2021-07-13: qty 1

## 2021-07-13 MED ORDER — CHLORHEXIDINE GLUCONATE 0.12 % MT SOLN
15.0000 mL | Freq: Once | OROMUCOSAL | Status: AC
  Administered 2021-07-13: 15 mL via OROMUCOSAL
  Filled 2021-07-13: qty 15

## 2021-07-13 MED ORDER — SODIUM CHLORIDE 0.9% FLUSH
3.0000 mL | Freq: Two times a day (BID) | INTRAVENOUS | Status: DC
  Administered 2021-07-14: 3 mL via INTRAVENOUS

## 2021-07-13 MED ORDER — LACTATED RINGERS IV SOLN: INTRAVENOUS | Status: DC

## 2021-07-13 MED ORDER — CEFAZOLIN SODIUM-DEXTROSE 2-4 GM/100ML-% IV SOLN
2.0000 g | Freq: Once | INTRAVENOUS | Status: AC
  Administered 2021-07-13: 2 g via INTRAVENOUS

## 2021-07-13 MED ORDER — CEFAZOLIN SODIUM-DEXTROSE 2-4 GM/100ML-% IV SOLN
INTRAVENOUS | Status: AC
  Filled 2021-07-13: qty 100

## 2021-07-13 MED ORDER — LABETALOL HCL 300 MG PO TABS
300.0000 mg | ORAL_TABLET | Freq: Two times a day (BID) | ORAL | Status: DC
  Administered 2021-07-13 – 2021-07-14 (×2): 300 mg via ORAL
  Filled 2021-07-13 (×3): qty 1

## 2021-07-13 MED ORDER — BUPIVACAINE-EPINEPHRINE (PF) 0.25% -1:200000 IJ SOLN
INTRAMUSCULAR | Status: AC
  Filled 2021-07-13: qty 30

## 2021-07-13 MED ORDER — OXYCODONE HCL 5 MG PO TABS: 5.0000 mg | ORAL_TABLET | ORAL | Status: DC | PRN

## 2021-07-13 MED ORDER — EPHEDRINE SULFATE-NACL 50-0.9 MG/10ML-% IV SOSY
PREFILLED_SYRINGE | INTRAVENOUS | Status: DC | PRN
  Administered 2021-07-13: 10 mg via INTRAVENOUS

## 2021-07-13 MED ORDER — SUCCINYLCHOLINE CHLORIDE 200 MG/10ML IV SOSY
PREFILLED_SYRINGE | INTRAVENOUS | Status: DC | PRN
  Administered 2021-07-13: 120 mg via INTRAVENOUS

## 2021-07-13 MED ORDER — ONDANSETRON HCL 4 MG PO TABS: 4.0000 mg | ORAL_TABLET | Freq: Four times a day (QID) | ORAL | Status: DC | PRN

## 2021-07-13 MED ORDER — THROMBIN 20000 UNITS EX SOLR
CUTANEOUS | Status: AC
  Filled 2021-07-13: qty 20000

## 2021-07-13 MED ORDER — SODIUM CHLORIDE 0.9 % IV SOLN
0.0125 ug/kg/min | INTRAVENOUS | Status: AC
  Administered 2021-07-13: .2 ug/kg/min via INTRAVENOUS
  Filled 2021-07-13: qty 2000

## 2021-07-13 MED ORDER — FENTANYL CITRATE (PF) 250 MCG/5ML IJ SOLN
INTRAMUSCULAR | Status: DC | PRN
  Administered 2021-07-13 (×2): 50 ug via INTRAVENOUS
  Administered 2021-07-13: 100 ug via INTRAVENOUS
  Administered 2021-07-13: 50 ug via INTRAVENOUS

## 2021-07-13 MED ORDER — ONDANSETRON HCL 4 MG PO TABS: 4.0000 mg | ORAL_TABLET | Freq: Three times a day (TID) | ORAL | 0 refills | Status: DC | PRN

## 2021-07-13 MED ORDER — METHOCARBAMOL 1000 MG/10ML IJ SOLN
500.0000 mg | Freq: Four times a day (QID) | INTRAVENOUS | Status: DC | PRN
  Filled 2021-07-13: qty 5

## 2021-07-13 MED ORDER — ACETAMINOPHEN 500 MG PO TABS
1000.0000 mg | ORAL_TABLET | Freq: Once | ORAL | Status: AC
  Administered 2021-07-13: 1000 mg via ORAL
  Filled 2021-07-13: qty 2

## 2021-07-13 MED ORDER — 0.9 % SODIUM CHLORIDE (POUR BTL) OPTIME
TOPICAL | Status: DC | PRN
  Administered 2021-07-13: 1000 mL

## 2021-07-13 MED ORDER — THROMBIN 20000 UNITS EX SOLR
CUTANEOUS | Status: DC | PRN
  Administered 2021-07-13: 20 mL via TOPICAL

## 2021-07-13 MED ORDER — AMISULPRIDE (ANTIEMETIC) 5 MG/2ML IV SOLN: 10.0000 mg | Freq: Once | INTRAVENOUS | Status: DC | PRN

## 2021-07-13 MED ORDER — BUPIVACAINE-EPINEPHRINE 0.25% -1:200000 IJ SOLN
INTRAMUSCULAR | Status: DC | PRN
  Administered 2021-07-13: 10 mL

## 2021-07-13 MED ORDER — PROPOFOL 500 MG/50ML IV EMUL
INTRAVENOUS | Status: DC | PRN
  Administered 2021-07-13: 125 ug/kg/min via INTRAVENOUS

## 2021-07-13 MED ORDER — FLEET ENEMA 7-19 GM/118ML RE ENEM: 1.0000 | ENEMA | Freq: Once | RECTAL | Status: DC | PRN

## 2021-07-13 MED ORDER — LISINOPRIL 10 MG PO TABS
10.0000 mg | ORAL_TABLET | Freq: Every day | ORAL | Status: DC
  Administered 2021-07-13: 10 mg via ORAL
  Filled 2021-07-13: qty 1

## 2021-07-13 SURGICAL SUPPLY — 75 items
AGENT HMST KT MTR STRL THRMB (HEMOSTASIS) ×1
BAG COUNTER SPONGE SURGICOUNT (BAG) ×2 IMPLANT
BAG SPNG CNTER NS LX DISP (BAG) ×1
BLADE CLIPPER SURG (BLADE) IMPLANT
BUR EGG ELITE 4.0 (BURR) IMPLANT
BUR MATCHSTICK NEURO 3.0 LAGG (BURR) IMPLANT
CABLE BIPOLOR RESECTION CORD (MISCELLANEOUS) ×2 IMPLANT
CAGE SPNL 6D 14XMED 16X7X (Cage) IMPLANT
CANISTER SUCT 3000ML PPV (MISCELLANEOUS) ×2 IMPLANT
CLSR STERI-STRIP ANTIMIC 1/2X4 (GAUZE/BANDAGES/DRESSINGS) ×2 IMPLANT
COVER MAYO STAND STRL (DRAPES) ×6 IMPLANT
COVER PLATE (Plate) ×2 IMPLANT
COVER SURGICAL LIGHT HANDLE (MISCELLANEOUS) ×2 IMPLANT
DRAIN CHANNEL 15F RND FF W/TCR (WOUND CARE) IMPLANT
DRAPE C-ARM 42X72 X-RAY (DRAPES) ×2 IMPLANT
DRAPE POUCH INSTRU U-SHP 10X18 (DRAPES) ×2 IMPLANT
DRAPE SURG 17X23 STRL (DRAPES) ×2 IMPLANT
DRAPE U-SHAPE 47X51 STRL (DRAPES) ×2 IMPLANT
DRSG OPSITE POSTOP 3X4 (GAUZE/BANDAGES/DRESSINGS) ×2 IMPLANT
DRSG OPSITE POSTOP 4X6 (GAUZE/BANDAGES/DRESSINGS) ×1 IMPLANT
DURAPREP 26ML APPLICATOR (WOUND CARE) ×2 IMPLANT
ELECT COATED BLADE 2.86 ST (ELECTRODE) ×2 IMPLANT
ELECT PENCIL ROCKER SW 15FT (MISCELLANEOUS) ×2 IMPLANT
ELECT REM PT RETURN 9FT ADLT (ELECTROSURGICAL) ×2
ELECTRODE REM PT RTRN 9FT ADLT (ELECTROSURGICAL) ×1 IMPLANT
FEE INTRAOP CADWELL SUPPLY NCS (MISCELLANEOUS) IMPLANT
FEE INTRAOP MONITOR IMPULS NCS (MISCELLANEOUS) IMPLANT
FUSION TCS NANOLOCK 7MM 6DEG (Cage) ×4 IMPLANT
GLOVE SURG ENC MOIS LTX SZ6.5 (GLOVE) ×2 IMPLANT
GLOVE SURG MICRO LTX SZ8.5 (GLOVE) ×2 IMPLANT
GLOVE SURG UNDER POLY LF SZ6.5 (GLOVE) ×2 IMPLANT
GLOVE SURG UNDER POLY LF SZ8.5 (GLOVE) ×2 IMPLANT
GOWN STRL REUS W/ TWL LRG LVL3 (GOWN DISPOSABLE) ×1 IMPLANT
GOWN STRL REUS W/TWL 2XL LVL3 (GOWN DISPOSABLE) ×2 IMPLANT
GOWN STRL REUS W/TWL LRG LVL3 (GOWN DISPOSABLE) ×2
INTRAOP CADWELL SUPPLY FEE NCS (MISCELLANEOUS) ×1
INTRAOP DISP SUPPLY FEE NCS (MISCELLANEOUS) ×2
INTRAOP MONITOR FEE IMPULS NCS (MISCELLANEOUS) ×1
INTRAOP MONITOR FEE IMPULSE (MISCELLANEOUS) ×2
KIT BASIN OR (CUSTOM PROCEDURE TRAY) ×2 IMPLANT
KIT TURNOVER KIT B (KITS) ×2 IMPLANT
NDL SPNL 18GX3.5 QUINCKE PK (NEEDLE) ×1 IMPLANT
NEEDLE HYPO 22GX1.5 SAFETY (NEEDLE) ×2 IMPLANT
NEEDLE SPNL 18GX3.5 QUINCKE PK (NEEDLE) ×2 IMPLANT
NS IRRIG 1000ML POUR BTL (IV SOLUTION) ×2 IMPLANT
PACK ORTHO CERVICAL (CUSTOM PROCEDURE TRAY) ×2 IMPLANT
PACK UNIVERSAL I (CUSTOM PROCEDURE TRAY) ×2 IMPLANT
PAD ARMBOARD 7.5X6 YLW CONV (MISCELLANEOUS) ×4 IMPLANT
PATTIES SURGICAL .25X.25 (GAUZE/BANDAGES/DRESSINGS) ×2 IMPLANT
PIN DISTRACTION MAXCESS-C 14 (PIN) ×2 IMPLANT
PLATE LOCK ENDO TCS (Plate) ×2 IMPLANT
PLATE LOCK ENDO TCS F/COVER (Plate) IMPLANT
POSITIONER HEAD DONUT 9IN (MISCELLANEOUS) ×2 IMPLANT
PUTTY BONE DBX 2.5 MIS (Bone Implant) ×1 IMPLANT
RESTRAINT LIMB HOLDER UNIV (RESTRAINTS) ×2 IMPLANT
SCREW ENDO BONE 3.8X14MM (Screw) ×2 IMPLANT
SCREW LOCKING 14MMX3.5MM (Screw) ×2 IMPLANT
SPONGE INTESTINAL PEANUT (DISPOSABLE) ×5 IMPLANT
SPONGE SURGIFOAM ABS GEL 100 (HEMOSTASIS) ×2 IMPLANT
SPONGE T-LAP 4X18 ~~LOC~~+RFID (SPONGE) ×4 IMPLANT
STRIP CLOSURE SKIN 1/2X4 (GAUZE/BANDAGES/DRESSINGS) ×1 IMPLANT
SURGIFLO W/THROMBIN 8M KIT (HEMOSTASIS) ×1 IMPLANT
SUT BONE WAX W31G (SUTURE) ×2 IMPLANT
SUT MNCRL AB 3-0 PS2 27 (SUTURE) ×2 IMPLANT
SUT SILK 2 0 (SUTURE)
SUT SILK 2-0 18XBRD TIE 12 (SUTURE) IMPLANT
SUT VIC AB 2-0 CT1 18 (SUTURE) ×2 IMPLANT
SYR BULB IRRIG 60ML STRL (SYRINGE) ×2 IMPLANT
SYR CONTROL 10ML LL (SYRINGE) ×2 IMPLANT
TAPE CLOTH 4X10 WHT NS (GAUZE/BANDAGES/DRESSINGS) ×2 IMPLANT
TAPE UMBILICAL COTTON 1/8X30 (MISCELLANEOUS) ×2 IMPLANT
TOWEL GREEN STERILE (TOWEL DISPOSABLE) ×2 IMPLANT
TOWEL GREEN STERILE FF (TOWEL DISPOSABLE) ×2 IMPLANT
TRAY FOLEY MTR SLVR 16FR STAT (SET/KITS/TRAYS/PACK) ×1 IMPLANT
WATER STERILE IRR 1000ML POUR (IV SOLUTION) ×2 IMPLANT

## 2021-07-13 NOTE — Brief Op Note (Signed)
07/13/2021  5:10 PM  PATIENT:  [name]  60 y.o. male  PRE-OPERATIVE DIAGNOSIS:  Cervical myelopathy  POST-OPERATIVE DIAGNOSIS:  Cervical myelopathy  PROCEDURE:  Procedure(s) with comments: ANTERIOR CERVICAL DECOMPRESSION/DISCECTOMY FUSION CERVICAL THREE-FOUR, CERVICAL FOUR -FIVE (N/A) - ANTERIOR CERVICAL DECOMPRESSION/DISCECTOMY FUSION CERVICAL THREE-FOUR, CERVICAL FOUR -FIVE  SURGEON:  Surgeon(s) and Role:    Venita Lick, MD - Primary  PHYSICIAN ASSISTANT:   ASSISTANTS: Voncille Lo, PA   ANESTHESIA:   general  EBL:  50 mL   BLOOD ADMINISTERED:none  DRAINS: none   LOCAL MEDICATIONS USED:  MARCAINE     SPECIMEN:  No Specimen  DISPOSITION OF SPECIMEN:  N/A  COUNTS:  YES  TOURNIQUET:  * No tourniquets in log *  DICTATION: .Dragon Dictation  PLAN OF CARE: Admit to inpatient   PATIENT DISPOSITION:  PACU - hemodynamically stable.

## 2021-07-13 NOTE — Op Note (Signed)
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  7 mm medium lordotic with 14 mm locking screws and 0 profile anterior plate.  Graft: DBX mix  Neuro monitoring: Improvement in SSEP and evoked motor potentials at the conclusion of the case.  No abnormal activity noted throughout the case.  BRIEF HISTORY: [name] is a 60 y.o. male who presents to my office with significant neck pain, difficulty ambulating and maintaining his balance.  He also had radicular left arm pain.  Imaging studies demonstrate multilevel degenerative disc disease of the cervical spine most prominent at C3-5.  MRI demonstrated cord signal changes at C3-4 with hard disc osteophyte formation causing significant spinal stenosis.  As result of the clinical findings of myelopathy we elected to move forward with surgery.  Patient does have left foraminal stenosis which was irritating the C7 nerve but this was not his predominant problem.  PROCEDURE DETAILS: Patient was brought into the operating room and was properly positioned on the operating room table.  After induction with general anesthesia the patient was endotracheally intubated.  A timeout was taken to confirm all important data: including patient, procedure, and the level. Teds, SCD's, and a Foley were applied.   The monitoring representative then applied all appropriate needles and pads for intraoperative SSEP and evoked motor potential monitoring.  The anterior cervical spine was prepped and draped in a standard fashion.  Using  fluoroscopy I marked out my incision site at the midportion of C4 vertebral body.  This was infiltrated with quarter percent Marcaine with epinephrine and a transverse incision was made starting at the midline and proceeding to the left.  Sharp dissection was carried out down to the platysma.  I sharply incised the platysma then began dissecting along the medial border the sternocleidomastoid carrying out a standard Smith-Robinson approach to the cervical spine.  This was an avascular plane.  Identified the carotid sheath and was able to sweep the esophagus and trachea to the right and protect the carotid sheath my finger on the left side.  Using Kitner dissectors I removed the remaining prevertebral fascia until I can visualize the anterior longitudinal ligament.  I then placed a needle into the C3-4 disc space and took an x-ray to confirm I was at the appropriate level.  I then used bipolar cautery to mobilize the longus coli muscles bilaterally from the midportion of C3 to the midportion of C5.  The anterior exostoses were removed with a double-action Leksell rongeur.  Caspar retracting blades were placed underneath the longus coli muscle and the endotracheal cuff was reduced and the retractors were set.  The endotracheal cuff was reinflated and I proceeded with the C3-4 discectomy.  An annulotomy was performed with a 15 blade scalpel and I remove the bulk of the disc material pituitary rongeurs.  The osteophyte formation from the inferior aspect of the C3 vertebral body was removed with a 2 mm Kerrison rongeur allowing for better visualization of the disc space.  Kerrison rongeurs and pituitary rongeurs as well as curettes were used to remove the bulk of the disc material.  Distraction pins were then placed into the body of C3 and C4 and I distracted the intervertebral space with a lamina spreader and maintained the distraction pin set.  I continue to use my curettes to remove the posterior disc material  until I was down to the annulus.  I then used my 1 mm Kerrison rongeur to remove the inferior osteophyte from the vertebral body of C4.  I then used my fine nerve hook to dissect through the posterior longitudinal ligament and create a plane between the thecal sac and the PLL.  I then used my 1 mm Kerrison rongeur to resect the posterior annulus and posterior longitudinal ligament.  I can now undercut under the uncovertebral joint and trim down the osteophyte from the inferior aspect of the C3 vertebral body.  At this point I could freely pass my nerve hook under the vertebral body of C3 and C4 from the uncovertebral joint on the right all the way to the uncovertebral joint on the left.  I confirmed adequate decompression and discectomy.  At this point I trialed the intervertebral spacers and elected to use the size 7 medium cage.  The endplates were prepped so I had bleeding subchondral bone and then I obtained the implant and packed it with the allograft bone.  It was then [facility] to the appropriate depth.  I then used the awl and placed a 14 mm locking screw through the cage and into the C3 vertebral body and a second one into the C4 vertebral body at this point the 0 profile device was secured in place.  The anterior locking plate was then secured and tightened and the manufacture standards.  I reposition the Caspar retractors to expose the C4-5 disc space and proceeded with the discectomy at this level.   Using the same technique technique that had used at C3-4 I completed the discectomy at C4-5.  Again great care was taken to remove the posterior osteophytes from the C4 and C5 vertebral body and to dissect through the PLL and resect this to allow adequate decompression of the thecal sac.  I also undercut the uncovertebral joints and as I had done at the C3-4 level to further decompress the nerve root.  At this point I obtained the trials and elected to use the size 7 medium implant.  I had bleeding  subchondral bone after I rasped the endplates I irrigated copiously normal saline and then inserted the device.  It had excellent purchase.  I secured it with 14 mm locking screws as I had done at the C3-4 level.  Again the 0 profile plate was then secured over the implant.  At this point I removed all the retractors and irrigated the wound copiously normal saline and obtain hemostasis using bipolar cautery and Floseal.  Once I confirmed hemostasis the trach and esophagus were returned to midline.  Final x-rays demonstrate satisfactory position of the hardware and the screws in both the AP and lateral planes.  The platysma was closed with interrupted #1 Vicryl suture and then the skin with 3-0 Monocryl sutures.  Steri-Strips and dry dressings were applied and the patient was ultimately extubated transferred to PACU without incident.  The end of the case all needle sponge counts were correct.  There were no adverse intraoperative events.  First Assistant: Voncille Lo, PA.  She was instrumental in positioning the patient, retraction to allow visualization, irrigation and suction as well as implant prep and wound closure.   Venita Lick, MD 07/13/2021  4:58 PM

## 2021-07-13 NOTE — Anesthesia Procedure Notes (Signed)
Procedure Name: Intubation Date/Time: 07/13/2021 1:27 PM Performed by: Lelon Perla, CRNA Pre-anesthesia Checklist: Patient identified, Emergency Drugs available, Suction available and Patient being monitored Patient Re-evaluated:Patient Re-evaluated prior to induction Oxygen Delivery Method: Circle system utilized Preoxygenation: Pre-oxygenation with 100% oxygen Induction Type: IV induction Ventilation: Mask ventilation without difficulty Laryngoscope Size: Glidescope and 4 Grade View: Grade I Tube type: Oral Tube size: 7.5 mm Number of attempts: 1 Airway Equipment and Method: Stylet and Oral airway Placement Confirmation: ETT inserted through vocal cords under direct vision, positive ETCO2 and breath sounds checked- equal and bilateral Secured at: 23 cm Tube secured with: Tape Dental Injury: Teeth and Oropharynx as per pre-operative assessment

## 2021-07-13 NOTE — Discharge Instructions (Signed)

## 2021-07-13 NOTE — H&P (Signed)
Addendum H&P: Continues to have significant neck and neuropathic arm pain.  Despite appropriate conservative management his quality of life is continued to deteriorate.  There is been no change in his clinical exam since his last office visit of 07/05/2021.  I reviewed the surgical procedure which is a two-level ACDF C3-5.  The patient has expressed an understanding and willingness to move forward with surgery.  All appropriate risks, benefits, and alternatives were reviewed.

## 2021-07-13 NOTE — Transfer of Care (Signed)
Immediate Anesthesia Transfer of Care Note  Patient: [name]  Procedure(s) Performed: ANTERIOR CERVICAL DECOMPRESSION/DISCECTOMY FUSION CERVICAL THREE-FOUR, CERVICAL FOUR -FIVE  Patient Location: PACU  Anesthesia Type:General  Level of Consciousness: awake, alert  and oriented  Airway & Oxygen Therapy: Patient Spontanous Breathing and Patient connected to nasal cannula oxygen  Post-op Assessment: Report given to RN and Post -op Vital signs reviewed and stable  Post vital signs: Reviewed and stable  Last Vitals:  Vitals Value Taken Time  BP 170/96 07/13/21 1723  Temp    Pulse 87 07/13/21 1726  Resp 10 07/13/21 1726  SpO2 100 % 07/13/21 1726  Vitals shown include unvalidated device data.  Last Pain:  Vitals:   07/13/21 1231  TempSrc:   PainSc: 0-No pain         Complications: No notable events documented.

## 2021-07-14 ENCOUNTER — Encounter (HOSPITAL_COMMUNITY): Payer: Self-pay | Admitting: Orthopedic Surgery

## 2021-07-14 DIAGNOSIS — M50021 Cervical disc disorder at C4-C5 level with myelopathy: Secondary | ICD-10-CM | POA: Diagnosis not present

## 2021-07-14 DIAGNOSIS — I10 Essential (primary) hypertension: Secondary | ICD-10-CM | POA: Diagnosis not present

## 2021-07-14 DIAGNOSIS — M5001 Cervical disc disorder with myelopathy,  high cervical region: Secondary | ICD-10-CM | POA: Diagnosis not present

## 2021-07-14 DIAGNOSIS — Z79899 Other long term (current) drug therapy: Secondary | ICD-10-CM | POA: Diagnosis not present

## 2021-07-14 DIAGNOSIS — I2510 Atherosclerotic heart disease of native coronary artery without angina pectoris: Secondary | ICD-10-CM | POA: Diagnosis not present

## 2021-07-14 NOTE — TOC Progression Note (Addendum)
Transition of Care [facility]) - Progression Note    Patient Details  Name: [name] MRN: [id number] Date of Birth: 07-08-1961  Transition of Care [facility]) CM/SW Contact  Beckie Busing, RN Phone Number:[phone]  07/14/2021, 10:36 AM  Clinical Narrative:    Taxi voucher provided for surgical patient discharging home. Patients daughter is now able to pick patient up. Taxi voucher cancelled.         Expected Discharge Plan and Services                                                 Social Determinants of Health (SDOH) Interventions    Readmission Risk Interventions No flowsheet data found.

## 2021-07-14 NOTE — Progress Notes (Signed)
Patient awaiting for transport to his vehicle via wheelchair by volunteer for discharge home; in no acute distress nor complaints of pain nor discomfort; incision on his anterior neck with honeycomb dressing and is clean, dry and intact with an Aspen collar on; room was checked and accounted for all his belongings; discharge instructions concerning his medications, wound care, follow up appointment and when to call the doctor as needed were all discussed with patient by RN and he expressed understanding on the instructions given.

## 2021-07-14 NOTE — Progress Notes (Signed)
    Subjective: Procedure(s) (LRB): ANTERIOR CERVICAL DECOMPRESSION/DISCECTOMY FUSION CERVICAL THREE-FOUR, CERVICAL FOUR -FIVE (N/A) 1 Day Post-Op  Patient reports pain as 1 on 0-10 scale.  Reports decreased arm pain reports incisional neck pain  -mild swelling is noted. Positive void Negative bowel movement Positive flatus Negative chest pain or shortness of breath  Objective: Vital signs in last 24 hours: Temp:  [98 F (36.7 C)-99.2 F (37.3 C)] 98.7 F (37.1 C) (11/10 0744) Pulse Rate:  [76-92] 92 (11/10 0744) Resp:  [11-20] 18 (11/10 0744) BP: (133-182)/(79-97) 146/82 (11/10 0744) SpO2:  [97 %-100 %] 98 % (11/10 0744) Weight:  [69.4 kg] 69.4 kg (11/09 1224)  Intake/Output from previous day: 11/09 0701 - 11/10 0700 In: 1700 [I.V.:1600; IV Piggyback:100] Out: 760 [Urine:710; Blood:50]  Labs: No results for input(s): WBC, RBC, HCT, PLT in the last 72 hours. No results for input(s): NA, K, CL, CO2, BUN, CREATININE, GLUCOSE, CALCIUM in the last 72 hours. No results for input(s): LABPT, INR in the last 72 hours.  Physical Exam: Neurologically intact ABD soft Intact pulses distally Incision: dressing C/D/I and no drainage Compartment soft Mild dysphagia. No shortness of breath or difficulty breathing. Body mass index is 22.59 kg/m.  Assessment/Plan: Patient stable  Mobilization with physical therapy Encourage incentive spirometry Continue care  Overall patient doing exceptionally well.  He is tolerating regular diet, voiding spontaneously, and ambulating without difficulty.  Balance has improved and the preoperative neuropathic arm pain has resolved. Plan on discharge to home today.  I recommended he continue to ice the incision to help reduce the swelling.  He will follow-up with me in 2 weeks.  Venita Lick, MD [facility] [phone]

## 2021-07-14 NOTE — Evaluation (Signed)
Occupational Therapy Evaluation Patient Details Name: [name] MRN: [id number] DOB: 12-Mar-1961 Today's Date: 07/14/2021   History of Present Illness 60 y.o. male presents to [facility] hospital on 07/13/2021 with neck and UE radiating pain. Pt underwent C3-5 ACDF on 07/13/2021. PMH includes CAD, syncope, HTN, Bipolar, grave's disease.   Clinical Impression   PTA, pt was living with his parents and was independent. Currently, pt performing ADLs and functional mobility at Mod I level. Provided education and handout on cervical precautions, collar management, UB ADLs, LB ADLs, toileting, and shower transfer with shower seat; pt demonstrated understanding. Answered all pt questions. Recommend dc home once medically stable per physician. All acute OT needs met and will sign off. Thank you.    Recommendations for follow up therapy are one component of a multi-disciplinary discharge planning process, led by the attending physician.  Recommendations may be updated based on patient status, additional functional criteria and insurance authorization.   Follow Up Recommendations  No OT follow up    Assistance Recommended at Discharge Intermittent Supervision/Assistance  Functional Status Assessment  Patient has had a recent decline in their functional status and demonstrates the ability to make significant improvements in function in a reasonable and predictable amount of time.  Equipment Recommendations  None recommended by OT    Recommendations for Other Services       Precautions / Restrictions Precautions Precautions: Cervical Precaution Booklet Issued: Yes (comment) Precaution Comments: pt is able to verbalize neck precautions Required Braces or Orthoses: Cervical Brace Cervical Brace: Hard collar (on when out of bed) Restrictions Weight Bearing Restrictions: No      Mobility Bed Mobility               General bed mobility comments: Sitting at EOB. Reviewing log roll technique     Transfers Overall transfer level: Independent                        Balance Overall balance assessment: Mild deficits observed, not formally tested                                         ADL either performed or assessed with clinical judgement   ADL Overall ADL's : Modified independent                                             Vision Baseline Vision/History: 1 Wears glasses       Perception     Praxis      Pertinent Vitals/Pain Pain Assessment: No/denies pain     Hand Dominance Right   Extremity/Trunk Assessment Upper Extremity Assessment Upper Extremity Assessment: Overall WFL for tasks assessed;LUE deficits/detail LUE Deficits / Details: Reports continued numbness in 1-3 digits. numbness less than prior to sx   Lower Extremity Assessment Lower Extremity Assessment: Defer to PT evaluation   Cervical / Trunk Assessment Cervical / Trunk Assessment: Neck Surgery   Communication Communication Communication: No difficulties   Cognition Arousal/Alertness: Awake/alert Behavior During Therapy: WFL for tasks assessed/performed Overall Cognitive Status: Within Functional Limits for tasks assessed  General Comments  VSS on RA. Mother in room    Exercises     [place] expects to be discharged to:: Private residence Living Arrangements: Parent Available Help at Discharge: Family;Available 24 hours/day Type of Home: House Home Access: Stairs to enter [employer] of Steps: 4 Entrance Stairs-Rails: Left Home Layout: One level     Bathroom Shower/Tub: Occupational psychologist: Standard     Home Equipment: Conservation officer, nature (2 wheels);Cane - single point;Wheelchair - manual          Prior Functioning/Environment Prior Level of Function : Independent/Modified Independent                ADLs Comments: Cares for parents who live with him        OT Problem List: Decreased activity tolerance;Decreased knowledge of use of DME or AE;Decreased knowledge of precautions      OT Treatment/Interventions:      OT Goals(Current goals can be found in the care plan section) Acute Rehab OT Goals Patient Stated Goal: Go home OT Goal Formulation: With patient Time For Goal Achievement: 07/28/21 Potential to Achieve Goals: Good  OT Frequency:     Barriers to D/C:            Co-evaluation              AM-PAC OT "6 Clicks" Daily Activity     Outcome Measure Help from another person eating meals?: None Help from another person taking care of personal grooming?: None Help from another person toileting, which includes using toliet, bedpan, or urinal?: None Help from another person bathing (including washing, rinsing, drying)?: None Help from another person to put on and taking off regular upper body clothing?: None Help from another person to put on and taking off regular lower body clothing?: None 6 Click Score: 24   End of Session Equipment Utilized During Treatment: Gait belt Nurse Communication: Mobility status  Activity Tolerance: Patient tolerated treatment well Patient left: in bed;with call bell/phone within reach (at EOB)  OT Visit Diagnosis: Unsteadiness on feet (R26.81);Other abnormalities of gait and mobility (R26.89);Muscle weakness (generalized) (M62.81)                Time: 3646-8032 OT Time Calculation (min): 9 min Charges:  OT General Charges $OT Visit: 1 Visit OT Evaluation $OT Eval Low Complexity: Happy, OTR/L Acute Rehab Pager: [phone] Office: [place] 07/14/2021, 9:03 AM

## 2021-07-14 NOTE — Plan of Care (Signed)

## 2021-07-14 NOTE — Evaluation (Signed)
Physical Therapy Evaluation Patient Details Name: [name] MRN: [id number] DOB: 1960-11-13 Today's Date: 07/14/2021  History of Present Illness  60 y.o. male presents to [facility] hospital on 07/13/2021 with neck and UE radiating pain. Pt underwent C3-5 ACDF on 07/13/2021. PMH includes CAD, syncope, HTN, Bipolar, grave's disease.  Clinical Impression  Pt presents to PT with deficits in high level balance, endurance, gait. Pt is able to ambulate for household distances without physical assistance. PT provides cues to descend steps sideways with BUE support of railings to improve stability. Pt is able to verbalize neck precautions and brace use. PT recommends discharge home when medically ready.     Recommendations for follow up therapy are one component of a multi-disciplinary discharge planning process, led by the attending physician.  Recommendations may be updated based on patient status, additional functional criteria and insurance authorization.  Follow Up Recommendations No PT follow up    Assistance Recommended at Discharge PRN  Functional Status Assessment Patient has had a recent decline in their functional status and demonstrates the ability to make significant improvements in function in a reasonable and predictable amount of time.  Equipment Recommendations  None recommended by PT    Recommendations for Other Services       Precautions / Restrictions Precautions Precautions: Cervical Precaution Booklet Issued: Yes (comment) Precaution Comments: pt is able to verbalize neck precautions Required Braces or Orthoses: Cervical Brace Cervical Brace: Hard collar (on when out of bed) Restrictions Weight Bearing Restrictions: No      Mobility  Bed Mobility                    Transfers Overall transfer level: Independent                      Ambulation/Gait Ambulation/Gait assistance: Supervision Gait Distance (Feet): 400 Feet Assistive device: None Gait  Pattern/deviations: Step-through pattern Gait velocity: functional Gait velocity interpretation: >2.62 ft/sec, indicative of community ambulatory   General Gait Details: pt with steady step-through gait, pt with slowed gait speed when approachign obstacles  Stairs Stairs: Yes Stairs assistance: Supervision Stair Management: One rail Left;Alternating pattern Number of Stairs: 4    Wheelchair Mobility    Modified Rankin (Stroke Patients Only)       Balance Overall balance assessment: Mild deficits observed, not formally tested                                           Pertinent Vitals/Pain Pain Assessment: No/denies pain    Home Living Family/patient expects to be discharged to:: Private residence Living Arrangements: Parent Available Help at Discharge: Family;Available 24 hours/day Type of Home: House Home Access: Stairs to enter Entrance Stairs-Rails: Left Entrance Stairs-Number of Steps: 4   Home Layout: One level Home Equipment: Agricultural consultant (2 wheels);Cane - single point;Wheelchair - manual      Prior Function Prior Level of Function : Independent/Modified Independent                     Hand Dominance        Extremity/Trunk Assessment   Upper Extremity Assessment Upper Extremity Assessment: Defer to OT evaluation    Lower Extremity Assessment Lower Extremity Assessment: Overall WFL for tasks assessed    Cervical / Trunk Assessment Cervical / Trunk Assessment: Neck Surgery  Communication   Communication: No difficulties  Cognition Arousal/Alertness: Awake/alert Behavior During Therapy: WFL for tasks assessed/performed Overall Cognitive Status: Within Functional Limits for tasks assessed                                          General Comments General comments (skin integrity, edema, etc.): VSS on RA    Exercises     Assessment/Plan    PT Assessment Patient needs continued PT services  PT  Problem List Decreased balance;Decreased activity tolerance;Decreased mobility;Impaired sensation       PT Treatment Interventions DME instruction;Gait training;Stair training;Functional mobility training;Balance training;Neuromuscular re-education;Patient/family education    PT Goals (Current goals can be found in the Care Plan section)  Acute Rehab PT Goals Patient Stated Goal: to go home PT Goal Formulation: With patient Time For Goal Achievement: 07/18/21 Potential to Achieve Goals: Good    Frequency Min 5X/week   Barriers to discharge        Co-evaluation               AM-PAC PT "6 Clicks" Mobility  Outcome Measure Help needed turning from your back to your side while in a flat bed without using bedrails?: A Little Help needed moving from lying on your back to sitting on the side of a flat bed without using bedrails?: A Little Help needed moving to and from a bed to a chair (including a wheelchair)?: None Help needed standing up from a chair using your arms (e.g., wheelchair or bedside chair)?: None Help needed to walk in hospital room?: A Little Help needed climbing 3-5 steps with a railing? : A Little 6 Click Score: 20    End of Session Equipment Utilized During Treatment: Cervical collar Activity Tolerance: Patient tolerated treatment well Patient left: in bed;with call bell/phone within reach Nurse Communication: Mobility status PT Visit Diagnosis: Other abnormalities of gait and mobility (R26.89)    Time: 3382-5053 PT Time Calculation (min) (ACUTE ONLY): 12 min   Charges:   PT Evaluation $PT Eval Low Complexity: 1 Low          Arlyss Gandy, PT, DPT Acute Rehabilitation Pager: [phone] Office [phone]   Arlyss Gandy 07/14/2021, 8:30 AM

## 2021-07-15 NOTE — Discharge Summary (Signed)
Patient ID: [name] MRN: [id number] DOB/AGE: Nov 17, 1960 60 y.o.  Admit date: 07/13/2021 Discharge date: 07/14/2021  Admission Diagnoses:  Active Problems:   Cervical myelopathy [facility])   Discharge Diagnoses:  Active Problems:   Cervical myelopathy (HCC)  status post procedure(s): ANTERIOR CERVICAL DECOMPRESSION/DISCECTOMY FUSION CERVICAL THREE-FOUR, CERVICAL FOUR -FIVE  Past Medical History:  Diagnosis Date   Anxiety    Arthritis    neck and knee   Barrett's esophagus    Bipolar 1 disorder ([place])    CAD in native artery    Cervical disc syndrome    Depression    GERD (gastroesophageal reflux disease)    Grave's disease    Graves' disease 10/04/2011   H/O prostatitis    Headache    Hiatal hernia    Hypertension    Lumbar degenerative disc disease    Mild carotid artery disease ([place])    Mixed hyperlipidemia 12/27/2017   Myocardial infarction ([place]) 2000   Mild heart attack   Stenosis of left vertebrobasilar artery    Stenosis of right subclavian artery ([place])    Tobacco abuse 12/27/2017    Surgeries: Procedure(s): ANTERIOR CERVICAL DECOMPRESSION/DISCECTOMY FUSION CERVICAL THREE-FOUR, CERVICAL FOUR -FIVE on 07/13/2021   Consultants:   Discharged Condition: Improved  Hospital Course: [name] is an 60 y.o. male who was admitted 07/13/2021 for operative treatment of Cervical spondylitic myelopathy C3-5. Patient failed conservative treatments (please see the history and physical for the specifics) and had severe unremitting pain that affects sleep, daily activities and work/hobbies. After pre-op clearance, the patient was taken to the operating room on 07/13/2021 and underwent  Procedure(s): ANTERIOR CERVICAL DECOMPRESSION/DISCECTOMY FUSION CERVICAL THREE-FOUR, CERVICAL FOUR -FIVE.    Patient was given perioperative antibiotics:  Anti-infectives (From admission, onward)    Start     Dose/Rate Route Frequency Ordered Stop   07/13/21 2200  ceFAZolin (ANCEF)  IVPB 1 g/50 mL premix        1 g 100 mL/hr over 30 Minutes Intravenous Every 8 hours 07/13/21 1833 07/14/21 0549   07/13/21 1246  ceFAZolin (ANCEF) 2-4 GM/100ML-% IVPB       Note to Pharmacy: Alvy Beal   : cabinet override      07/13/21 1246 07/13/21 1356   07/13/21 1245  ceFAZolin (ANCEF) IVPB 2g/100 mL premix        2 g 200 mL/hr over 30 Minutes Intravenous  Once 07/13/21 1237 07/13/21 1356        Patient was given sequential compression devices and early ambulation to prevent DVT.   Patient benefited maximally from hospital stay and there were no complications. At the time of discharge, the patient was urinating/moving their bowels without difficulty, tolerating a regular diet, pain is controlled with oral pain medications and they have been cleared by PT/OT.   Recent vital signs: No data found.   Recent laboratory studies: No results for input(s): WBC, HGB, HCT, PLT, NA, K, CL, CO2, BUN, CREATININE, GLUCOSE, INR, CALCIUM in the last 72 hours.  Invalid input(s): PT, 2   Discharge Medications:   Allergies as of 07/14/2021       Reactions   Bee Venom Anaphylaxis        Medication List     STOP taking these medications    aspirin EC 81 MG tablet   EPINEPHrine 0.3 mg/0.3 mL Soaj injection Commonly known as: EPI-PEN   oxycodone 30 MG immediate release tablet Commonly known as: ROXICODONE       TAKE these  medications    amLODipine 5 MG tablet Commonly known as: NORVASC Take 5 mg by mouth daily.   atorvastatin 40 MG tablet Commonly known as: LIPITOR Take 1 tablet (40 mg total) by mouth daily. TAKE 1 TABLET BY MOUTH ONCE DAILY   buPROPion 150 MG 24 hr tablet Commonly known as: WELLBUTRIN XL Take 450 mg by mouth daily.   doxazosin 4 MG tablet Commonly known as: CARDURA Take 4 mg by mouth 2 (two) times daily.   labetalol 300 MG tablet Commonly known as: NORMODYNE Take 300 mg by mouth 2 (two) times daily.   levothyroxine 150 MCG tablet Commonly  known as: SYNTHROID Take 150 mcg by mouth daily before breakfast.   lisinopril 10 MG tablet Commonly known as: ZESTRIL Take 10 mg by mouth at bedtime.   methocarbamol 500 MG tablet Commonly known as: Robaxin Take 1 tablet (500 mg total) by mouth every 8 (eight) hours as needed for up to 5 days for muscle spasms.   nitroGLYCERIN 0.4 MG SL tablet Commonly known as: NITROSTAT Place 0.4 mg under the tongue every 5 (five) minutes as needed for chest pain.   omeprazole 20 MG capsule Commonly known as: PRILOSEC Take 20 mg by mouth daily.   ondansetron 4 MG tablet Commonly known as: Zofran Take 1 tablet (4 mg total) by mouth every 8 (eight) hours as needed for nausea or vomiting.   oxyCODONE-acetaminophen 10-325 MG tablet Commonly known as: Percocet Take 1 tablet by mouth every 6 (six) hours as needed for up to 5 days for pain.   QUEtiapine 100 MG tablet Commonly known as: SEROQUEL Take 300 mg by mouth at bedtime.   Vitamin D 50 MCG (2000 UT) tablet Take 2,000 Units by mouth daily.        Diagnostic Studies: DG Chest 2 View  Result Date: 07/04/2021 CLINICAL DATA:  60 year old male with a history of low-grade fever EXAM: CHEST - 2 VIEW COMPARISON:  01/22/2016 FINDINGS: Cardiomediastinal silhouette unchanged in size and contour. No evidence of central vascular congestion. No interlobular septal thickening. No pneumothorax or pleural effusion. Coarsened interstitial markings, with no confluent airspace disease. No acute displaced fracture. Degenerative changes of the spine. Surgical changes at the base of the neck IMPRESSION: No active cardiopulmonary disease. Electronically Signed   By: Gilmer Mor D.O.   On: 07/04/2021 11:21   DG Cervical Spine 2 or 3 views  Result Date: 07/13/2021 CLINICAL DATA:  Anterior cervical decompression EXAM: CERVICAL SPINE - 2-3 VIEW COMPARISON:  03/06/2018 FINDINGS: Two low resolution intraoperative spot views of the cervical spine. Total fluoroscopy  time was 52 seconds. The images demonstrate post fusion changes at C3-C4 and C4-C5. IMPRESSION: Intraoperative fluoroscopic assistance provided during cervical spine surgery Electronically Signed   By: Jasmine Pang M.D.   On: 07/13/2021 17:14   DG C-Arm 1-60 Min-No Report  Result Date: 07/13/2021 Fluoroscopy was utilized by the requesting physician.  No radiographic interpretation.   DG C-Arm 1-60 Min-No Report  Result Date: 07/13/2021 Fluoroscopy was utilized by the requesting physician.  No radiographic interpretation.   DG C-Arm 1-60 Min-No Report  Result Date: 07/13/2021 Fluoroscopy was utilized by the requesting physician.  No radiographic interpretation.   DG C-Arm 1-60 Min-No Report  Result Date: 07/13/2021 Fluoroscopy was utilized by the requesting physician.  No radiographic interpretation.    Discharge Instructions     Incentive spirometry RT   Complete by: As directed         Follow-up Information  Melina Schools, MD. Schedule an appointment as soon as possible for a visit in 2 week(s).   Specialty: Orthopedic Surgery Why: If symptoms worsen, As needed, For suture removal, For wound re-check Contact information: [address] [place] [id number]                 Discharge Plan:  discharge to home  Disposition: stable    Signed: Charlyne Petrin for [facility] PA-C [facility] [phone] 07/15/2021, 8:05 AM

## 2021-07-15 NOTE — Anesthesia Postprocedure Evaluation (Signed)
Anesthesia Post Note  Patient: [name]  Procedure(s) Performed: ANTERIOR CERVICAL DECOMPRESSION/DISCECTOMY FUSION CERVICAL THREE-FOUR, CERVICAL FOUR -FIVE     Patient location during evaluation: PACU Anesthesia Type: General Level of consciousness: awake and alert Pain management: pain level controlled Vital Signs Assessment: post-procedure vital signs reviewed and stable Respiratory status: spontaneous breathing, nonlabored ventilation, respiratory function stable and patient connected to nasal cannula oxygen Cardiovascular status: blood pressure returned to baseline and stable Postop Assessment: no apparent nausea or vomiting Anesthetic complications: no   No notable events documented.  Last Vitals:  Vitals:   07/14/21 0338 07/14/21 0744  BP: 133/84 (!) 146/82  Pulse: 87 92  Resp: 18 18  Temp: 37 C 37.1 C  SpO2: 99% 98%    Last Pain:  Vitals:   07/14/21 0744  TempSrc: Oral  PainSc: 3                  Kennieth Rad

## 2021-07-18 ENCOUNTER — Ambulatory Visit: Payer: Medicare Other | Admitting: Interventional Cardiology

## 2021-07-26 DIAGNOSIS — M4712 Other spondylosis with myelopathy, cervical region: Secondary | ICD-10-CM | POA: Diagnosis not present

## 2021-08-30 DIAGNOSIS — Z4889 Encounter for other specified surgical aftercare: Secondary | ICD-10-CM | POA: Diagnosis not present

## 2021-09-23 DIAGNOSIS — M542 Cervicalgia: Secondary | ICD-10-CM | POA: Diagnosis not present

## 2021-09-28 DIAGNOSIS — M542 Cervicalgia: Secondary | ICD-10-CM | POA: Diagnosis not present

## 2021-09-29 DIAGNOSIS — E039 Hypothyroidism, unspecified: Secondary | ICD-10-CM | POA: Diagnosis not present

## 2021-09-29 DIAGNOSIS — E781 Pure hyperglyceridemia: Secondary | ICD-10-CM | POA: Diagnosis not present

## 2021-09-29 DIAGNOSIS — I10 Essential (primary) hypertension: Secondary | ICD-10-CM | POA: Diagnosis not present

## 2021-09-29 DIAGNOSIS — M4712 Other spondylosis with myelopathy, cervical region: Secondary | ICD-10-CM | POA: Diagnosis not present

## 2021-09-29 DIAGNOSIS — I209 Angina pectoris, unspecified: Secondary | ICD-10-CM | POA: Diagnosis not present

## 2021-09-29 DIAGNOSIS — M4722 Other spondylosis with radiculopathy, cervical region: Secondary | ICD-10-CM | POA: Diagnosis not present

## 2021-10-05 DIAGNOSIS — M542 Cervicalgia: Secondary | ICD-10-CM | POA: Diagnosis not present

## 2021-10-11 DIAGNOSIS — Z4889 Encounter for other specified surgical aftercare: Secondary | ICD-10-CM | POA: Diagnosis not present

## 2021-10-12 DIAGNOSIS — M542 Cervicalgia: Secondary | ICD-10-CM | POA: Diagnosis not present

## 2021-10-25 DIAGNOSIS — M25512 Pain in left shoulder: Secondary | ICD-10-CM | POA: Diagnosis not present

## 2021-10-26 DIAGNOSIS — M542 Cervicalgia: Secondary | ICD-10-CM | POA: Diagnosis not present

## 2021-11-01 DIAGNOSIS — M25512 Pain in left shoulder: Secondary | ICD-10-CM | POA: Diagnosis not present

## 2021-11-02 DIAGNOSIS — M542 Cervicalgia: Secondary | ICD-10-CM | POA: Diagnosis not present

## 2021-11-08 DIAGNOSIS — M75122 Complete rotator cuff tear or rupture of left shoulder, not specified as traumatic: Secondary | ICD-10-CM | POA: Diagnosis not present

## 2021-11-08 DIAGNOSIS — M25512 Pain in left shoulder: Secondary | ICD-10-CM | POA: Diagnosis not present

## 2021-11-08 DIAGNOSIS — G5602 Carpal tunnel syndrome, left upper limb: Secondary | ICD-10-CM | POA: Diagnosis not present

## 2021-11-08 DIAGNOSIS — M79642 Pain in left hand: Secondary | ICD-10-CM | POA: Diagnosis not present

## 2021-11-11 DIAGNOSIS — Z79899 Other long term (current) drug therapy: Secondary | ICD-10-CM | POA: Diagnosis not present

## 2021-11-11 DIAGNOSIS — E0500 Thyrotoxicosis with diffuse goiter without thyrotoxic crisis or storm: Secondary | ICD-10-CM | POA: Diagnosis not present

## 2021-11-11 DIAGNOSIS — D649 Anemia, unspecified: Secondary | ICD-10-CM | POA: Diagnosis not present

## 2021-11-11 DIAGNOSIS — E039 Hypothyroidism, unspecified: Secondary | ICD-10-CM | POA: Diagnosis not present

## 2021-11-11 DIAGNOSIS — E781 Pure hyperglyceridemia: Secondary | ICD-10-CM | POA: Diagnosis not present

## 2021-11-11 DIAGNOSIS — K760 Fatty (change of) liver, not elsewhere classified: Secondary | ICD-10-CM | POA: Diagnosis not present

## 2021-11-11 DIAGNOSIS — M25512 Pain in left shoulder: Secondary | ICD-10-CM | POA: Diagnosis not present

## 2021-11-11 DIAGNOSIS — I10 Essential (primary) hypertension: Secondary | ICD-10-CM | POA: Diagnosis not present

## 2021-11-11 DIAGNOSIS — M4722 Other spondylosis with radiculopathy, cervical region: Secondary | ICD-10-CM | POA: Diagnosis not present

## 2021-11-29 DIAGNOSIS — G5603 Carpal tunnel syndrome, bilateral upper limbs: Secondary | ICD-10-CM | POA: Diagnosis not present

## 2021-12-09 DIAGNOSIS — G5602 Carpal tunnel syndrome, left upper limb: Secondary | ICD-10-CM | POA: Diagnosis not present

## 2021-12-23 DIAGNOSIS — G5602 Carpal tunnel syndrome, left upper limb: Secondary | ICD-10-CM | POA: Diagnosis not present

## 2022-01-10 DIAGNOSIS — M542 Cervicalgia: Secondary | ICD-10-CM | POA: Diagnosis not present

## 2022-01-16 ENCOUNTER — Telehealth: Payer: Self-pay | Admitting: *Deleted

## 2022-01-16 NOTE — Telephone Encounter (Signed)
? ?  Pre-operative Risk Assessment  ?  ?Patient Name: [name]  ?DOB: 1961-06-29 ?MRN: [id number]  ? ?  ? ?Request for Surgical Clearance   ? ?Procedure:   REVERSE TOTAL SHOULDER  ? ?Date of Surgery:  Clearance TBD  (June, THOUGH NO DATE WAS CLEARANCE FORM)                            ?   ?Surgeon:  DR. Esmond Plants ?Surgeon's Group or Practice Name:  [facility] ?Phone number:  [phone] ?Fax number:  [phone] ATTN: ASHLEY HILTON ?  ?Type of Clearance Requested:   ?- Medical , NO MEDICATIONS ARE BEING  REQUESTED TO BE HELD ?  ?Type of Anesthesia:  Not Indicated (CHOICE?) ?  ?Additional requests/questions:   ? ?Signed, ?Julaine Hua   ?01/16/2022, 12:53 PM  ? ?

## 2022-01-16 NOTE — Telephone Encounter (Signed)
Left verbal message with the pt's mother to have pt call the office and ask to s/w the pre op team for tele pre op appt. ?

## 2022-01-16 NOTE — Telephone Encounter (Signed)
? ? ?  Name: [name]  ?DOB: 1960/09/10  ?MRN: [id number] ? ?Primary Cardiologist: Lesleigh Noe, MD ? ? ?Preoperative team, please contact this patient and set up a phone call appointment for further preoperative risk assessment. Please obtain consent and complete medication review. Thank you for your help. ? ?I confirm that guidance regarding antiplatelet and oral anticoagulation therapy has been completed and, if necessary, noted below. ? ? ?Joylene Grapes, NP ?01/16/2022, 4:48 PM ?[facility] ?[address] ?[place], [place] [address] ? ? ?

## 2022-01-17 ENCOUNTER — Telehealth: Payer: Self-pay | Admitting: *Deleted

## 2022-01-17 NOTE — Telephone Encounter (Signed)
Pt agreeable to plan of care for tele pre op appt 01/26/22 @ 10 am. Med rec and consent are done.  ? ?  ?Patient Consent for Virtual Visit  ? ? ?   ? ?[name] has provided verbal consent on 01/17/2022 for a virtual visit (video or telephone). ? ? ?CONSENT FOR VIRTUAL VISIT FOR:  [name]  ?By participating in this virtual visit I agree to the following: ? ?I hereby voluntarily request, consent and authorize [facility] and its employed or contracted physicians, physician assistants, nurse practitioners or other licensed health care professionals (the Practitioner), to provide me with telemedicine health care services (the ?Services") as deemed necessary by the treating Practitioner. I acknowledge and consent to receive the Services by the Practitioner via telemedicine. I understand that the telemedicine visit will involve communicating with the Practitioner through live audiovisual communication technology and the disclosure of certain medical information by electronic transmission. I acknowledge that I have been given the opportunity to request an in-person assessment or other available alternative prior to the telemedicine visit and am voluntarily participating in the telemedicine visit. ? ?I understand that I have the right to withhold or withdraw my consent to the use of telemedicine in the course of my care at any time, without affecting my right to future care or treatment, and that the Practitioner or I may terminate the telemedicine visit at any time. I understand that I have the right to inspect all information obtained and/or recorded in the course of the telemedicine visit and may receive copies of available information for a reasonable fee.  I understand that some of the potential risks of receiving the Services via telemedicine include:  ?Delay or interruption in medical evaluation due to technological equipment failure or disruption; ?Information transmitted may not be sufficient (e.g.  poor resolution of images) to allow for appropriate medical decision making by the Practitioner; and/or  ?In rare instances, security protocols could fail, causing a breach of personal health information. ? ?Furthermore, I acknowledge that it is my responsibility to provide information about my medical history, conditions and care that is complete and accurate to the best of my ability. I acknowledge that Practitioner's advice, recommendations, and/or decision may be based on factors not within their control, such as incomplete or inaccurate data provided by me or distortions of diagnostic images or specimens that may result from electronic transmissions. I understand that the practice of medicine is not an exact science and that Practitioner makes no warranties or guarantees regarding treatment outcomes. I acknowledge that a copy of this consent can be made available to me via my patient portal [facility] MyChart), or I can request a printed copy by calling the office of [facility].   ? ?I understand that my insurance will be billed for this visit.  ? ?I have read or had this consent read to me. ?I understand the contents of this consent, which adequately explains the benefits and risks of the Services being provided via telemedicine.  ?I have been provided ample opportunity to ask questions regarding this consent and the Services and have had my questions answered to my satisfaction. ?I give my informed consent for the services to be provided through the use of telemedicine in my medical care ? ? ? ?

## 2022-01-17 NOTE — Telephone Encounter (Signed)
Pt agreeable to plan of care for tele pre op appt 01/26/22 @ 10 am. Med rec and consent are done.  ?

## 2022-01-23 DIAGNOSIS — E781 Pure hyperglyceridemia: Secondary | ICD-10-CM | POA: Diagnosis not present

## 2022-01-23 DIAGNOSIS — I10 Essential (primary) hypertension: Secondary | ICD-10-CM | POA: Diagnosis not present

## 2022-01-23 DIAGNOSIS — E039 Hypothyroidism, unspecified: Secondary | ICD-10-CM | POA: Diagnosis not present

## 2022-01-25 DIAGNOSIS — M75122 Complete rotator cuff tear or rupture of left shoulder, not specified as traumatic: Secondary | ICD-10-CM | POA: Diagnosis not present

## 2022-01-26 ENCOUNTER — Ambulatory Visit (INDEPENDENT_AMBULATORY_CARE_PROVIDER_SITE_OTHER): Payer: Medicare Other | Admitting: General Practice

## 2022-01-26 DIAGNOSIS — Z01810 Encounter for preprocedural cardiovascular examination: Secondary | ICD-10-CM

## 2022-01-26 NOTE — Progress Notes (Signed)
Virtual Visit via Telephone Note   Because of [name]'s co-morbid illnesses, he is at least at moderate risk for complications without adequate follow up.  This format is felt to be most appropriate for this patient at this time.  The patient did not have access to video technology/had technical difficulties with video requiring transitioning to audio format only (telephone).  All issues noted in this document were discussed and addressed.  No physical exam could be performed with this format.  Please refer to the patient's chart for his consent to telehealth for [name].  Evaluation Performed:  Preoperative cardiovascular risk assessment _____________   Date:  01/26/2022   Patient ID:  [name], DOB [birth year], MRN [id number] Patient Location:  Home Provider location:   Office  Primary Care Provider:  Lajean Manes, MD Primary Cardiologist:  Sinclair Grooms, MD  Chief Complaint / Patient Profile   61 y.o. y/o male with a h/o coronary artery disease, mild carotid artery disease, hypertension, hyperlipidemia who is pending reverse total shoulder and presents today for telephonic preoperative cardiovascular risk assessment.  Past Medical History    Past Medical History:  Diagnosis Date   Anxiety    Arthritis    neck and knee   Barrett's esophagus    Bipolar 1 disorder ([place])    CAD in native artery    Cervical disc syndrome    Depression    GERD (gastroesophageal reflux disease)    Grave's disease    Graves' disease 10/04/2011   H/O prostatitis    Headache    Hiatal hernia    Hypertension    Lumbar degenerative disc disease    Mild carotid artery disease ([place])    Mixed hyperlipidemia 12/27/2017   Myocardial infarction ([place]) 2000   Mild heart attack   Stenosis of left vertebrobasilar artery    Stenosis of right subclavian artery (HCC)    Tobacco abuse 12/27/2017   Past Surgical History:  Procedure Laterality Date   ANTERIOR CERVICAL  DECOMP/DISCECTOMY FUSION N/A 07/13/2021   Procedure: ANTERIOR CERVICAL DECOMPRESSION/DISCECTOMY FUSION CERVICAL THREE-FOUR, CERVICAL FOUR -FIVE;  Surgeon: Melina Schools, MD;  Location: [facility];  Service: Orthopedics;  Laterality: N/A;  ANTERIOR CERVICAL DECOMPRESSION/DISCECTOMY FUSION CERVICAL THREE-FOUR, CERVICAL FOUR -FIVE   CARDIAC CATHETERIZATION  2000   CHOLECYSTECTOMY  12/11/2013   DR Redmond Pulling    CHOLECYSTECTOMY N/A 12/11/2013   Procedure: LAPAROSCOPIC CHOLECYSTECTOMY WITH INTRAOPERATIVE CHOLANGIOGRAM;  Surgeon: Gayland Curry, MD;  Location: [facility];  Service: General;  Laterality: N/A;   IR ANGIO INTRA EXTRACRAN SEL COM CAROTID INNOMINATE BILAT MOD SED  05/02/2018   IR ANGIO VERTEBRAL SEL VERTEBRAL BILAT MOD SED  05/02/2018   KNEE SURGERY     LEFT HEART CATH AND CORONARY ANGIOGRAPHY N/A 03/12/2018   Procedure: LEFT HEART CATH AND CORONARY ANGIOGRAPHY;  Surgeon: Belva Crome, MD;  Location: [facility];  Service: Cardiovascular;  Laterality: N/A;   testicular torsion surgery     THYROIDECTOMY      Allergies  Allergies  Allergen Reactions   Bee Venom Anaphylaxis    History of Present Illness    [name] is a 61 y.o. male who presents via audio/video conferencing for a telehealth visit today.  Pt was last seen in cardiology clinic on 07/04/2021 by Melina Copa PA-C.  At that time [name] was doing well .  The patient is now pending procedure as outlined above. Since his last visit, he continues to be  stable from a cardiac standpoint.  Today he denies chest pain, shortness of breath, lower extremity edema, fatigue, palpitations, melena, hematuria, hemoptysis, diaphoresis, weakness, presyncope, syncope, orthopnea, and PND.   Home Medications    Prior to Admission medications   Medication Sig Start Date End Date Taking? Authorizing Provider  amLODipine (NORVASC) 5 MG tablet Take 5 mg by mouth daily. 03/18/16   [provider]  atorvastatin (LIPITOR) 40 MG tablet  Take 1 tablet (40 mg total) by mouth daily. TAKE 1 TABLET BY MOUTH ONCE DAILY 07/04/21   Dunn, Nedra Hai, PA-C  buPROPion (WELLBUTRIN XL) 150 MG 24 hr tablet Take 450 mg by mouth daily.    [provider]  Cholecalciferol (VITAMIN D) 50 MCG (2000 UT) tablet Take 2,000 Units by mouth daily.    [provider]  doxazosin (CARDURA) 4 MG tablet Take 4 mg by mouth 2 (two) times daily.    [provider]  labetalol (NORMODYNE) 300 MG tablet Take 300 mg by mouth 2 (two) times daily. 12/13/15   [provider]  levothyroxine (SYNTHROID) 137 MCG tablet Take 137 mcg by mouth daily before breakfast. 04/01/16   [provider]  lisinopril (PRINIVIL,ZESTRIL) 10 MG tablet Take 10 mg by mouth at bedtime.     [provider]  nitroGLYCERIN (NITROSTAT) 0.4 MG SL tablet Place 0.4 mg under the tongue every 5 (five) minutes as needed for chest pain.    [provider]  omeprazole (PRILOSEC) 20 MG capsule Take 20 mg by mouth daily.    [provider]  ondansetron (ZOFRAN) 4 MG tablet Take 1 tablet (4 mg total) by mouth every 8 (eight) hours as needed for nausea or vomiting. Patient not taking: Reported on 01/17/2022 07/13/21   Melina Schools, MD  QUEtiapine (SEROQUEL) 100 MG tablet Take 300 mg by mouth at bedtime. 06/06/21   [provider]    Physical Exam    Vital Signs:  OBRYAN ROSBERG does not have vital signs available for review today.  Given telephonic nature of communication, physical exam is limited. AAOx3. NAD. Normal affect.  Speech and respirations are unlabored.  Accessory Clinical Findings    None  Assessment & Plan    1.  Preoperative Cardiovascular Risk Assessment: Reverse total shoulder, [facility], Dr. Veverly Fells     Primary Cardiologist: Sinclair Grooms, MD  Chart reviewed as part of pre-operative protocol coverage. Given past medical history and time since last visit, based on ACC/AHA guidelines, [name] would be at acceptable risk for the planned procedure without further cardiovascular testing.   Patient was advised that if he develops new symptoms prior to surgery to contact our office to arrange a follow-up appointment.  He verbalized understanding.   His RCRI is a class II risk, 0.9% risk of major cardiac event.  He is able to complete greater than 4 METS of physical activity.    A copy of this note will be routed to requesting surgeon.  Time:   Today, I have spent 5 minutes with the patient with telehealth technology discussing medical history, symptoms, and management plan.  Prior to the phone evaluation I spent greater than 10 minutes reviewing patient's past medical history and medications.   Deberah Pelton, NP  01/26/2022, 7:16 AM

## 2022-03-21 NOTE — H&P (Signed)
Patient's anticipated LOS is less than 2 midnights, meeting these requirements: - Younger than 22 - Lives within 1 hour of care - Has a competent adult at home to recover with post-op recover - NO history of  - Chronic pain requiring opiods  - Diabetes  - Coronary Artery Disease  - Heart failure  - Heart attack  - Stroke  - DVT/VTE  - Cardiac arrhythmia  - Respiratory Failure/COPD  - Renal failure  - Anemia  - Advanced Liver disease     [name] is an 61 y.o. male.    Chief Complaint: left shoulder pain  HPI: Pt is a 61 y.o. male complaining of left shoulder pain for multiple years. Pain had continually increased since the beginning. X-rays in the clinic show end-stage arthritic changes of the left shoulder. Pt has tried various conservative treatments which have failed to alleviate their symptoms, including injections and therapy. Various options are discussed with the patient. Risks, benefits and expectations were discussed with the patient. Patient understand the risks, benefits and expectations and wishes to proceed with surgery.   PCP:  Merlene Laughter, MD  D/C Plans: Home  PMH: Past Medical History:  Diagnosis Date   Anxiety    Arthritis    neck and knee   Barrett's esophagus    Bipolar 1 disorder (HCC)    CAD in native artery    Cervical disc syndrome    Depression    GERD (gastroesophageal reflux disease)    Grave's disease    Graves' disease 10/04/2011   H/O prostatitis    Headache    Hiatal hernia    Hypertension    Lumbar degenerative disc disease    Mild carotid artery disease (HCC)    Mixed hyperlipidemia 12/27/2017   Myocardial infarction (HCC) 2000   Mild heart attack   Stenosis of left vertebrobasilar artery    Stenosis of right subclavian artery (HCC)    Tobacco abuse 12/27/2017    PSH: Past Surgical History:  Procedure Laterality Date   ANTERIOR CERVICAL DECOMP/DISCECTOMY FUSION N/A 07/13/2021   Procedure: ANTERIOR CERVICAL  DECOMPRESSION/DISCECTOMY FUSION CERVICAL THREE-FOUR, CERVICAL FOUR -FIVE;  Surgeon: Venita Lick, MD;  Location: [facility] OR;  Service: Orthopedics;  Laterality: N/A;  ANTERIOR CERVICAL DECOMPRESSION/DISCECTOMY FUSION CERVICAL THREE-FOUR, CERVICAL FOUR -FIVE   CARDIAC CATHETERIZATION  2000   CHOLECYSTECTOMY  12/11/2013   DR Andrey Campanile    CHOLECYSTECTOMY N/A 12/11/2013   Procedure: LAPAROSCOPIC CHOLECYSTECTOMY WITH INTRAOPERATIVE CHOLANGIOGRAM;  Surgeon: Atilano Ina, MD;  Location: [facility] OR;  Service: General;  Laterality: N/A;   IR ANGIO INTRA EXTRACRAN SEL COM CAROTID INNOMINATE BILAT MOD SED  05/02/2018   IR ANGIO VERTEBRAL SEL VERTEBRAL BILAT MOD SED  05/02/2018   KNEE SURGERY     LEFT HEART CATH AND CORONARY ANGIOGRAPHY N/A 03/12/2018   Procedure: LEFT HEART CATH AND CORONARY ANGIOGRAPHY;  Surgeon: Lyn Records, MD;  Location: [facility] INVASIVE CV LAB;  Service: Cardiovascular;  Laterality: N/A;   testicular torsion surgery     THYROIDECTOMY      Social History:  reports that he quit smoking about 10 months ago. His smoking use included cigarettes. He has a 8.75 pack-year smoking history. He has never used smokeless tobacco. He reports that he does not drink alcohol and does not use drugs. BMI: Estimated body mass index is 22.59 kg/m as calculated from the following:   Height as of 07/13/21: 5\' 9"  (1.753 m).   Weight as of 07/13/21: 69.4 kg.  Lab Results  Component Value Date   ALBUMIN 4.6 04/10/2018   Diabetes: Patient does not have a diagnosis of diabetes.     Smoking Status: Social History   Tobacco Use  Smoking Status Former   Packs/day: 0.25   Years: 35.00   Total pack years: 8.75   Types: Cigarettes   Quit date: 05/05/2021   Years since quitting: 0.8  Smokeless Tobacco Never   The patient is not currently a tobacco user. Counseling given: Not Answered     Allergies:  Allergies  Allergen Reactions   Bee Venom Anaphylaxis   Cyclobenzaprine     hypotension    Medications: No  current facility-administered medications for this encounter.   Current Outpatient Medications  Medication Sig Dispense Refill   amLODipine (NORVASC) 5 MG tablet Take 5 mg by mouth daily.     aspirin EC 81 MG tablet Take 81 mg by mouth daily. Swallow whole.     atorvastatin (LIPITOR) 40 MG tablet Take 1 tablet (40 mg total) by mouth daily. TAKE 1 TABLET BY MOUTH ONCE DAILY (Patient taking differently: Take 40 mg by mouth at bedtime.) 90 tablet 3   buPROPion (WELLBUTRIN XL) 150 MG 24 hr tablet Take 450 mg by mouth daily.     Cholecalciferol (VITAMIN D) 50 MCG (2000 UT) tablet Take 2,000 Units by mouth daily.     Cyanocobalamin (B-12 PO) Take 1 capsule by mouth daily.     diclofenac Sodium (VOLTAREN) 1 % GEL Apply 1 Application topically 4 (four) times daily as needed (pain).     doxazosin (CARDURA) 4 MG tablet Take 4 mg by mouth 2 (two) times daily.     EPINEPHrine (EPIPEN 2-PAK) 0.3 mg/0.3 mL IJ SOAJ injection Inject 0.3 mg into the muscle as needed for anaphylaxis.     labetalol (NORMODYNE) 300 MG tablet Take 300 mg by mouth 2 (two) times daily.     levothyroxine (SYNTHROID) 137 MCG tablet Take 137 mcg by mouth daily before breakfast.     lisinopril (PRINIVIL,ZESTRIL) 10 MG tablet Take 10 mg by mouth at bedtime.      nitroGLYCERIN (NITROSTAT) 0.4 MG SL tablet Place 0.4 mg under the tongue every 5 (five) minutes as needed for chest pain.     omeprazole (PRILOSEC) 20 MG capsule Take 20 mg by mouth daily.     oxycodone (ROXICODONE) 30 MG immediate release tablet Take 30 mg by mouth 3 (three) times daily as needed for pain.     QUEtiapine (SEROQUEL) 100 MG tablet Take 300 mg by mouth at bedtime.      No results found for this or any previous visit (from the past 48 hour(s)). No results found.  ROS: Pain with rom of the left upper extremity  Physical Exam: Alert and oriented 61 y.o. male in no acute distress Cranial nerves 2-12 intact Cervical spine: full rom with no tenderness, nv intact  distally Chest: active breath sounds bilaterally, no wheeze rhonchi or rales Heart: regular rate and rhythm, no murmur Abd: non tender non distended with active bowel sounds Hip is stable with rom  Left shoulder painful and weak rom Nv intact distally No rashes or edema distally  Assessment/Plan Assessment: left shoulder cuff arthropathy  Plan:  Patient will undergo a left reverse total shoulder by Dr. Ranell Patrick at [place] Risks benefits and expectations were discussed with the patient. Patient understand risks, benefits and expectations and wishes to proceed. Preoperative templating of the joint replacement has been completed, documented, and submitted to the Operating Room  personnel in order to optimize intra-operative equipment management.   Merla Riches PA-C, MPAS [facility] is now [employer] [address], [place], [place], [place] [address] Phone: [phone] [email or web address] Facebook  [employer]

## 2022-03-23 NOTE — Patient Instructions (Addendum)
DUE TO COVID-19 ONLY TWO VISITORS  (aged 60 and older)  ARE ALLOWED TO COME WITH YOU AND STAY IN THE WAITING ROOM ONLY DURING PRE OP AND PROCEDURE.   **NO VISITORS ARE ALLOWED IN THE SHORT STAY AREA OR RECOVERY ROOM!!**  IF YOU WILL BE ADMITTED INTO THE HOSPITAL YOU ARE ALLOWED ONLY FOUR SUPPORT PEOPLE DURING VISITATION HOURS ONLY (7 AM -8PM)   The support person(s) must pass our screening, gel in and out, and wear a mask at all times, including in the patient's room. Patients must also wear a mask when staff or their support person are in the room. Visitors GUEST BADGE MUST BE WORN VISIBLY  One adult visitor may remain with you overnight and MUST be in the room by 8 P.M.     Your procedure is scheduled on: 04/07/22   Report to [facility] Main Entrance    Report to admitting at : 9:30 AM   Call this number if you have problems the morning of surgery [phone]   Do not eat food :After Midnight.   After Midnight you may have the following liquids until : 9:00 AM DAY OF SURGERY  Water Black Coffee (sugar ok, NO MILK/CREAM OR CREAMERS)  Tea (sugar ok, NO MILK/CREAM OR CREAMERS) regular and decaf                             Plain Jell-O (NO RED)                                           Fruit ices (not with fruit pulp, NO RED)                                     Popsicles (NO RED)                                                                  Juice: apple, WHITE grape, WHITE cranberry Sports drinks like Gatorade (NO RED)              Drink  Ensure drink AT: 9:00 AM the day of surgery.     The day of surgery:  Drink ONE (1) Pre-Surgery Clear Ensure or G2 at AM the morning of surgery. Drink in one sitting. Do not sip.  This drink was given to you during your hospital  pre-op appointment visit. Nothing else to drink after completing the  Pre-Surgery Clear Ensure or G2.          If you have questions, please contact your surgeon's office.   Oral Hygiene is also important  to reduce your risk of infection.                                    Remember - BRUSH YOUR TEETH THE MORNING OF SURGERY WITH YOUR REGULAR TOOTHPASTE   Do NOT smoke after Midnight   Take these medicines the morning of surgery with A SIP  OF WATER: bupropion,doxazosin,labetalol,amlodipine,synthroid,omeprazole.  DO NOT TAKE ANY ORAL DIABETIC MEDICATIONS DAY OF YOUR SURGERY  Bring CPAP mask and tubing day of surgery.                              You may not have any metal on your body including hair pins, jewelry, and body piercing             Do not wear lotions, powders, perfumes/cologne, or deodorant              Men may shave face and neck.   Do not bring valuables to the hospital. [facility] IS NOT             RESPONSIBLE   FOR VALUABLES.   Contacts, dentures or bridgework may not be worn into surgery.   Bring small overnight bag day of surgery.   DO NOT BRING YOUR HOME MEDICATIONS TO THE HOSPITAL. PHARMACY WILL DISPENSE MEDICATIONS LISTED ON YOUR MEDICATION LIST TO YOU DURING YOUR ADMISSION IN THE HOSPITAL!    Patients discharged on the day of surgery will not be allowed to drive home.  Someone NEEDS to stay with you for the first 24 hours after anesthesia.   Special Instructions: Bring a copy of your healthcare power of attorney and living will documents         the day of surgery if you haven't scanned them before.              Please read over the following fact sheets you were given: IF YOU HAVE QUESTIONS ABOUT YOUR PRE-OP INSTRUCTIONS PLEASE CALL [phone]     [facility] - Preparing for Surgery Before surgery, you can play an important role.  Because skin is not sterile, your skin needs to be as free of germs as possible.  You can reduce the number of germs on your skin by washing with CHG (chlorahexidine gluconate) soap before surgery.  CHG is an antiseptic cleaner which kills germs and bonds with the skin to continue killing germs even after washing. Please DO NOT use  if you have an allergy to CHG or antibacterial soaps.  If your skin becomes reddened/irritated stop using the CHG and inform your nurse when you arrive at Short Stay. Do not shave (including legs and underarms) for at least 48 hours prior to the first CHG shower.  You may shave your face/neck. Please follow these instructions carefully:  1.  Shower with CHG Soap the night before surgery and the  morning of Surgery.  2.  If you choose to wash your hair, wash your hair first as usual with your  normal  shampoo.  3.  After you shampoo, rinse your hair and body thoroughly to remove the  shampoo.                           4.  Use CHG as you would any other liquid soap.  You can apply chg directly  to the skin and wash                       Gently with a scrungie or clean washcloth.  5.  Apply the CHG Soap to your body ONLY FROM THE NECK DOWN.   Do not use on face/ open  Wound or open sores. Avoid contact with eyes, ears mouth and genitals (private parts).                       Wash face,  Genitals (private parts) with your normal soap.             6.  Wash thoroughly, paying special attention to the area where your surgery  will be performed.  7.  Thoroughly rinse your body with warm water from the neck down.  8.  DO NOT shower/wash with your normal soap after using and rinsing off  the CHG Soap.                9.  Pat yourself dry with a clean towel.            10.  Wear clean pajamas.            11.  Place clean sheets on your bed the night of your first shower and do not  sleep with pets. Day of Surgery : Do not apply any lotions/deodorants the morning of surgery.  Please wear clean clothes to the hospital/surgery center.  FAILURE TO FOLLOW THESE INSTRUCTIONS MAY RESULT IN THE CANCELLATION OF YOUR SURGERY PATIENT SIGNATURE_________________________________  NURSE  SIGNATURE__________________________________  ________________________________________________________________________ [facility]- Preparing for Total Shoulder Arthroplasty    Before surgery, you can play an important role. Because skin is not sterile, your skin needs to be as free of germs as possible. You can reduce the number of germs on your skin by using the following products. Benzoyl Peroxide Gel Reduces the number of germs present on the skin Applied twice a day to shoulder area starting two days before surgery    ==================================================================  Please follow these instructions carefully:  BENZOYL PEROXIDE 5% GEL  Please do not use if you have an allergy to benzoyl peroxide.   If your skin becomes reddened/irritated stop using the benzoyl peroxide.  Starting two days before surgery, apply as follows: Apply benzoyl peroxide in the morning and at night. Apply after taking a shower. If you are not taking a shower clean entire shoulder front, back, and side along with the armpit with a clean wet washcloth.  Place a quarter-sized dollop on your shoulder and rub in thoroughly, making sure to cover the front, back, and side of your shoulder, along with the armpit.   2 days before ____ AM   ____ PM              1 day before ____ AM   ____ PM                         Do this twice a day for two days.  (Last application is the night before surgery, AFTER using the CHG soap as described below).  Do NOT apply benzoyl peroxide gel on the day of surgery.   Incentive Spirometer  An incentive spirometer is a tool that can help keep your lungs clear and active. This tool measures how well you are filling your lungs with each breath. Taking long deep breaths may help reverse or decrease the chance of developing breathing (pulmonary) problems (especially infection) following: A long period of time when you are unable to move or be active. BEFORE THE PROCEDURE   If the spirometer includes an indicator to show your best effort, your nurse or respiratory therapist will set it to a desired goal. If possible,  sit up straight or lean slightly forward. Try not to slouch. Hold the incentive spirometer in an upright position. INSTRUCTIONS FOR USE  Sit on the edge of your bed if possible, or sit up as far as you can in bed or on a chair. Hold the incentive spirometer in an upright position. Breathe out normally. Place the mouthpiece in your mouth and seal your lips tightly around it. Breathe in slowly and as deeply as possible, raising the piston or the ball toward the top of the column. Hold your breath for 3-5 seconds or for as long as possible. Allow the piston or ball to fall to the bottom of the column. Remove the mouthpiece from your mouth and breathe out normally. Rest for a few seconds and repeat Steps 1 through 7 at least 10 times every 1-2 hours when you are awake. Take your time and take a few normal breaths between deep breaths. The spirometer may include an indicator to show your best effort. Use the indicator as a goal to work toward during each repetition. After each set of 10 deep breaths, practice coughing to be sure your lungs are clear. If you have an incision (the cut made at the time of surgery), support your incision when coughing by placing a pillow or rolled up towels firmly against it. Once you are able to get out of bed, walk around indoors and cough well. You may stop using the incentive spirometer when instructed by your caregiver.  RISKS AND COMPLICATIONS Take your time so you do not get dizzy or light-headed. If you are in pain, you may need to take or ask for pain medication before doing incentive spirometry. It is harder to take a deep breath if you are having pain. AFTER USE Rest and breathe slowly and easily. It can be helpful to keep track of a log of your progress. Your caregiver can provide you with a simple table to help  with this. If you are using the spirometer at home, follow these instructions: [place] IF:  You are having difficultly using the spirometer. You have trouble using the spirometer as often as instructed. Your pain medication is not giving enough relief while using the spirometer. You develop fever of 100.5 F (38.1 C) or higher. SEEK IMMEDIATE MEDICAL CARE IF:  You cough up bloody sputum that had not been present before. You develop fever of 102 F (38.9 C) or greater. You develop worsening pain at or near the incision site. MAKE SURE YOU:  Understand these instructions. Will watch your condition. Will get help right away if you are not doing well or get worse. Document Released: 01/01/2007 Document Revised: 11/13/2011 Document Reviewed: 03/04/2007 [facility] Patient Information 2014 [place], [place].   ________________________________________________________________________

## 2022-03-27 ENCOUNTER — Encounter (HOSPITAL_COMMUNITY): Payer: Self-pay

## 2022-03-27 ENCOUNTER — Other Ambulatory Visit: Payer: Self-pay

## 2022-03-27 ENCOUNTER — Encounter (HOSPITAL_COMMUNITY)
Admission: RE | Admit: 2022-03-27 | Discharge: 2022-03-27 | Disposition: A | Discharge status: Home / Self Care | Payer: Medicare Other | Source: Ambulatory Visit | Attending: Orthopedic Surgery | Admitting: Orthopedic Surgery

## 2022-03-27 VITALS — BP 133/85 | HR 65 | Temp 97.9°F | Ht 69.0 in | Wt 161.0 lb

## 2022-03-27 DIAGNOSIS — I10 Essential (primary) hypertension: Secondary | ICD-10-CM | POA: Insufficient documentation

## 2022-03-27 DIAGNOSIS — Z01818 Encounter for other preprocedural examination: Secondary | ICD-10-CM

## 2022-03-27 DIAGNOSIS — Z01812 Encounter for preprocedural laboratory examination: Secondary | ICD-10-CM | POA: Insufficient documentation

## 2022-03-27 HISTORY — DX: Hypothyroidism, unspecified: E03.9

## 2022-03-27 LAB — BASIC METABOLIC PANEL
Anion gap: 6 (ref 5–15)
BUN: 19 mg/dL (ref 6–20)
CO2: 27 mmol/L (ref 22–32)
Calcium: 9 mg/dL (ref 8.9–10.3)
Chloride: 107 mmol/L (ref 98–111)
Creatinine, Ser: 0.8 mg/dL (ref 0.61–1.24)
GFR, Estimated: >60 mL/min (ref >60)
Glucose, Bld: 116 mg/dL — ABNORMAL HIGH (ref 70–99)
Potassium: 4.2 mmol/L (ref 3.5–5.1)
Sodium: 140 mmol/L (ref 135–145)

## 2022-03-27 LAB — CBC
HCT: 38.6 % — ABNORMAL LOW (ref 39.0–52.0)
Hemoglobin: 12.8 g/dL — ABNORMAL LOW (ref 13.0–17.0)
MCH: 29.8 pg (ref 26.0–34.0)
MCHC: 33.2 g/dL (ref 30.0–36.0)
MCV: 90 fL (ref 80.0–100.0)
Platelets: 233 10*3/uL (ref 150–400)
RBC: 4.29 MIL/uL (ref 4.22–5.81)
RDW: 12.6 % (ref 11.5–15.5)
WBC: 9.6 10*3/uL (ref 4.0–10.5)
nRBC: 0 % (ref 0.0–0.2)

## 2022-03-27 LAB — SURGICAL PCR SCREEN
MRSA, PCR: NEGATIVE
Staphylococcus aureus: POSITIVE — AB

## 2022-03-27 NOTE — Progress Notes (Signed)
For Short Stay: COVID SWAB appointment date: Date of COVID positive in last 90 days:  Bowel Prep reminder:   For Anesthesia: PCP - Dr. Merlene Laughter Cardiologist - Dr. Verdis Prime Clearance: Edd Fabian NP: 01/26/22: EPIC Chest x-ray - 07/04/21 EKG - 05/17/21 Stress Test -  ECHO - 01/02/18 Cardiac Cath - 03/12/18 Pacemaker/ICD device last checked: Pacemaker orders received: Device Rep notified:  Spinal Cord Stimulator:  Sleep Study -  CPAP -   Fasting Blood Sugar -  Checks Blood Sugar _____ times a day Date and result of last Hgb A1c-  Blood Thinner Instructions: Aspirin Instructions:Will be held 5 days before surgery as per Dr. Ranell Patrick instructions. Last Dose:  Activity level: Can go up a flight of stairs and activities of daily living without stopping and without chest pain and/or shortness of breath   Able to exercise without chest pain and/or shortness of breath   Unable to go up a flight of stairs without chest pain and/or shortness of breath     Anesthesia review: Hx: HTN,MI,CAD,PVD  Patient denies shortness of breath, fever, cough and chest pain at PAT appointment   Patient verbalized understanding of instructions that were given to them at the PAT appointment. Patient was also instructed that they will need to review over the PAT instructions again at home before surgery.

## 2022-03-27 NOTE — Progress Notes (Signed)
PCR: + STAPH °

## 2022-03-29 NOTE — Anesthesia Preprocedure Evaluation (Addendum)
Anesthesia Evaluation  Patient identified by MRN, date of birth, ID band Patient awake    Reviewed: Allergy & Precautions, NPO status , Patient's Chart, lab work & pertinent test results  Airway Mallampati: II  TM Distance: <3 FB Neck ROM: Full    Dental no notable dental hx.    Pulmonary neg pulmonary ROS, former smoker,    Pulmonary exam normal breath sounds clear to auscultation       Cardiovascular hypertension, + CAD, + Past MI and + Peripheral Vascular Disease  Normal cardiovascular exam Rhythm:Regular Rate:Normal     Neuro/Psych Bipolar Disorder negative neurological ROS     GI/Hepatic Neg liver ROS, GERD  Medicated,  Endo/Other  Hypothyroidism   Renal/GU negative Renal ROS  negative genitourinary   Musculoskeletal  (+) Arthritis , Osteoarthritis,    Abdominal   Peds negative pediatric ROS (+)  Hematology negative hematology ROS (+)   Anesthesia Other Findings   Reproductive/Obstetrics negative OB ROS                            Anesthesia Physical Anesthesia Plan  ASA: 3  Anesthesia Plan: General   Post-op Pain Management: Regional block*   Induction: Intravenous  PONV Risk Score and Plan: 2 and Ondansetron, Dexamethasone and Treatment may vary due to age or medical condition  Airway Management Planned: Oral ETT  Additional Equipment:   Intra-op Plan:   Post-operative Plan: Extubation in OR  Informed Consent: I have reviewed the patients History and Physical, chart, labs and discussed the procedure including the risks, benefits and alternatives for the proposed anesthesia with the patient or authorized representative who has indicated his/her understanding and acceptance.     Dental advisory given  Plan Discussed with: CRNA and Surgeon  Anesthesia Plan Comments: (See PAT note 03/27/2022)       Anesthesia Quick Evaluation

## 2022-03-29 NOTE — Progress Notes (Signed)
Anesthesia Chart Review   Case: [id number] Date/Time: 04/07/22 1152   Procedure: REVERSE SHOULDER ARTHROPLASTY (Left: Shoulder) - with ISB   Anesthesia type: General   Pre-op diagnosis: left shoulder osteoarthritis   Location: [facility] ROOM 06 / [facility]   Surgeons: Beverely Low, MD       DISCUSSION:61 y.o. former smoker with h/o HTN, Grave's disease, hypothyroidism, CAD, left shoulder OA scheduled for above procedure 04/07/2022 with Dr. Beverely Low.  Pt seen by cardiology 01/26/2022 for preoperative evaluation.  Per OV note, "Chart reviewed as part of pre-operative protocol coverage. Given past medical history and time since last visit, based on ACC/AHA guidelines, ANOTHONY BURSCH would be at acceptable risk for the planned procedure without further cardiovascular testing.    Patient was advised that if he develops new symptoms prior to surgery to contact our office to arrange a follow-up appointment.  He verbalized understanding.   His RCRI is a class II risk, 0.9% risk of major cardiac event.  He is able to complete greater than 4 METS of physical activity."  Anticipate pt can proceed with planned procedure barring acute status change.   VS: BP 133/85   Pulse 65   Temp 36.6 C (Oral)   Ht 5\' 9"  (1.753 m)   Wt 73 kg   SpO2 98%   BMI 23.78 kg/m   PROVIDERS: , MD is PCP   Cardiologist - Dr. Emilio Aspen LABS: Labs reviewed: Acceptable for surgery. (all labs ordered are listed, but only abnormal results are displayed)  Labs Reviewed  SURGICAL PCR SCREEN - Abnormal; Notable for the following components:      Result Value   Staphylococcus aureus POSITIVE (*)    All other components within normal limits  BASIC METABOLIC PANEL - Abnormal; Notable for the following components:   Glucose, Bld 116 (*)    All other components within normal limits  CBC - Abnormal; Notable for the following components:   Hemoglobin 12.8 (*)    HCT 38.6 (*)    All other components within  normal limits     IMAGES:   EKG:   CV: Echo 01/02/2018 - Left ventricle: The cavity size was normal. Wall thickness was    normal. Systolic function was normal. The estimated ejection    fraction was in the range of 55% to 60%. Wall motion was normal;    there were no regional wall motion abnormalities. Left    ventricular diastolic function parameters were normal.  - Aortic valve: Trileaflet; mildly calcified leaflets. Sclerosis    without stenosis. Mean gradient (S): 9 mm Hg.  - Mitral valve: There was trivial regurgitation.  - Left atrium: The atrium was mildly dilated.  - Right ventricle: The cavity size was normal. Systolic function    was normal.  - Tricuspid valve: Peak RV-RA gradient (S): 30 mm Hg.  - Pulmonary arteries: PA peak pressure: 33 mm Hg (S).  - Inferior vena cava: The vessel was normal in size. The    respirophasic diameter changes were in the normal range (>= 50%),    consistent with normal central venous pressure.  Past Medical History:  Diagnosis Date   Anxiety    Arthritis    neck and knee   Barrett's esophagus    Bipolar 1 disorder (HCC)    CAD in native artery    Cervical disc syndrome    Depression    GERD (gastroesophageal reflux disease)    Grave's disease    Graves'  disease 10/04/2011   H/O prostatitis    Headache    Hiatal hernia    Hypertension    Hypothyroidism    Lumbar degenerative disc disease    Mild carotid artery disease (HCC)    Mixed hyperlipidemia 12/27/2017   Myocardial infarction (HCC) 2000   Mild heart attack   Stenosis of left vertebrobasilar artery    Stenosis of right subclavian artery (HCC)    Tobacco abuse 12/27/2017    Past Surgical History:  Procedure Laterality Date   ANTERIOR CERVICAL DECOMP/DISCECTOMY FUSION N/A 07/13/2021   Procedure: ANTERIOR CERVICAL DECOMPRESSION/DISCECTOMY FUSION CERVICAL THREE-FOUR, CERVICAL FOUR -FIVE;  Surgeon: Venita Lick, MD;  Location: [facility];  Service: Orthopedics;  Laterality:  N/A;  ANTERIOR CERVICAL DECOMPRESSION/DISCECTOMY FUSION CERVICAL THREE-FOUR, CERVICAL FOUR -FIVE   CARDIAC CATHETERIZATION  2000   CHOLECYSTECTOMY  12/11/2013   DR Andrey Campanile    CHOLECYSTECTOMY N/A 12/11/2013   Procedure: LAPAROSCOPIC CHOLECYSTECTOMY WITH INTRAOPERATIVE CHOLANGIOGRAM;  Surgeon: Atilano Ina, MD;  Location: [facility];  Service: General;  Laterality: N/A;   IR ANGIO INTRA EXTRACRAN SEL COM CAROTID INNOMINATE BILAT MOD SED  05/02/2018   IR ANGIO VERTEBRAL SEL VERTEBRAL BILAT MOD SED  05/02/2018   KNEE SURGERY     LEFT HEART CATH AND CORONARY ANGIOGRAPHY N/A 03/12/2018   Procedure: LEFT HEART CATH AND CORONARY ANGIOGRAPHY;  Surgeon: Lyn Records, MD;  Location: [facility];  Service: Cardiovascular;  Laterality: N/A;   testicular torsion surgery     THYROIDECTOMY      MEDICATIONS:  amLODipine (NORVASC) 5 MG tablet   aspirin EC 81 MG tablet   atorvastatin (LIPITOR) 40 MG tablet   buPROPion (WELLBUTRIN XL) 150 MG 24 hr tablet   Cholecalciferol (VITAMIN D) 50 MCG (2000 UT) tablet   Cyanocobalamin (B-12 PO)   diclofenac Sodium (VOLTAREN) 1 % GEL   doxazosin (CARDURA) 4 MG tablet   EPINEPHrine (EPIPEN 2-PAK) 0.3 mg/0.3 mL IJ SOAJ injection   labetalol (NORMODYNE) 300 MG tablet   levothyroxine (SYNTHROID) 137 MCG tablet   lisinopril (PRINIVIL,ZESTRIL) 10 MG tablet   nitroGLYCERIN (NITROSTAT) 0.4 MG SL tablet   omeprazole (PRILOSEC) 20 MG capsule   oxycodone (ROXICODONE) 30 MG immediate release tablet   QUEtiapine (SEROQUEL) 100 MG tablet   No current facility-administered medications for this encounter.    Jodell Cipro Ward, PA-C [facility] [phone]

## 2022-04-07 ENCOUNTER — Ambulatory Visit (HOSPITAL_BASED_OUTPATIENT_CLINIC_OR_DEPARTMENT_OTHER): Payer: Medicare Other | Admitting: Certified Registered"

## 2022-04-07 ENCOUNTER — Encounter (HOSPITAL_COMMUNITY): Payer: Self-pay | Admitting: Orthopedic Surgery

## 2022-04-07 ENCOUNTER — Observation Stay (HOSPITAL_COMMUNITY)
Admission: RE | Admit: 2022-04-07 | Discharge: 2022-04-08 | Disposition: A | Discharge status: Home / Self Care | Payer: Medicare Other | Source: Ambulatory Visit | Attending: Orthopedic Surgery | Admitting: Orthopedic Surgery

## 2022-04-07 ENCOUNTER — Encounter (HOSPITAL_COMMUNITY)
Admission: RE | Disposition: A | Discharge status: Home / Self Care | Payer: Self-pay | Source: Ambulatory Visit | Attending: Orthopedic Surgery

## 2022-04-07 ENCOUNTER — Other Ambulatory Visit: Payer: Self-pay

## 2022-04-07 ENCOUNTER — Ambulatory Visit (HOSPITAL_COMMUNITY): Payer: Medicare Other | Admitting: Physician Assistant

## 2022-04-07 ENCOUNTER — Observation Stay (HOSPITAL_COMMUNITY): Payer: Medicare Other

## 2022-04-07 DIAGNOSIS — M75102 Unspecified rotator cuff tear or rupture of left shoulder, not specified as traumatic: Secondary | ICD-10-CM

## 2022-04-07 DIAGNOSIS — I2510 Atherosclerotic heart disease of native coronary artery without angina pectoris: Secondary | ICD-10-CM | POA: Insufficient documentation

## 2022-04-07 DIAGNOSIS — M19012 Primary osteoarthritis, left shoulder: Principal | ICD-10-CM | POA: Insufficient documentation

## 2022-04-07 DIAGNOSIS — M75122 Complete rotator cuff tear or rupture of left shoulder, not specified as traumatic: Secondary | ICD-10-CM | POA: Diagnosis not present

## 2022-04-07 DIAGNOSIS — Z87891 Personal history of nicotine dependence: Secondary | ICD-10-CM

## 2022-04-07 DIAGNOSIS — E039 Hypothyroidism, unspecified: Secondary | ICD-10-CM | POA: Diagnosis not present

## 2022-04-07 DIAGNOSIS — Z79899 Other long term (current) drug therapy: Secondary | ICD-10-CM | POA: Diagnosis not present

## 2022-04-07 DIAGNOSIS — Z96612 Presence of left artificial shoulder joint: Secondary | ICD-10-CM

## 2022-04-07 DIAGNOSIS — Z7982 Long term (current) use of aspirin: Secondary | ICD-10-CM | POA: Diagnosis not present

## 2022-04-07 DIAGNOSIS — Z9889 Other specified postprocedural states: Secondary | ICD-10-CM | POA: Diagnosis not present

## 2022-04-07 DIAGNOSIS — G8918 Other acute postprocedural pain: Secondary | ICD-10-CM | POA: Diagnosis not present

## 2022-04-07 DIAGNOSIS — Z471 Aftercare following joint replacement surgery: Secondary | ICD-10-CM | POA: Diagnosis not present

## 2022-04-07 DIAGNOSIS — I10 Essential (primary) hypertension: Secondary | ICD-10-CM | POA: Insufficient documentation

## 2022-04-07 DIAGNOSIS — Z96642 Presence of left artificial hip joint: Secondary | ICD-10-CM | POA: Diagnosis not present

## 2022-04-07 HISTORY — PX: REVERSE SHOULDER ARTHROPLASTY: SHX5054

## 2022-04-07 SURGERY — ARTHROPLASTY, SHOULDER, TOTAL, REVERSE
Anesthesia: General | Site: Shoulder | Laterality: Left

## 2022-04-07 MED ORDER — BUPROPION HCL ER (XL) 300 MG PO TB24
450.0000 mg | ORAL_TABLET | Freq: Every day | ORAL | Status: DC
  Administered 2022-04-08: 450 mg via ORAL
  Filled 2022-04-07 (×2): qty 1

## 2022-04-07 MED ORDER — PANTOPRAZOLE SODIUM 40 MG PO TBEC
40.0000 mg | DELAYED_RELEASE_TABLET | Freq: Every day | ORAL | Status: DC
  Administered 2022-04-08: 40 mg via ORAL
  Filled 2022-04-07 (×2): qty 1

## 2022-04-07 MED ORDER — QUETIAPINE FUMARATE 50 MG PO TABS
300.0000 mg | ORAL_TABLET | Freq: Every day | ORAL | Status: DC
  Administered 2022-04-07: 300 mg via ORAL
  Filled 2022-04-07: qty 6

## 2022-04-07 MED ORDER — ONDANSETRON HCL 4 MG/2ML IJ SOLN
INTRAMUSCULAR | Status: DC | PRN
  Administered 2022-04-07: 4 mg via INTRAVENOUS

## 2022-04-07 MED ORDER — OXYCODONE HCL 5 MG PO TABS: 30.0000 mg | ORAL_TABLET | Freq: Three times a day (TID) | ORAL | Status: DC | PRN

## 2022-04-07 MED ORDER — KETOROLAC TROMETHAMINE 15 MG/ML IJ SOLN
INTRAMUSCULAR | Status: AC
  Filled 2022-04-07: qty 1

## 2022-04-07 MED ORDER — EPHEDRINE 5 MG/ML INJ
INTRAVENOUS | Status: AC
  Filled 2022-04-07: qty 5

## 2022-04-07 MED ORDER — PHENYLEPHRINE 80 MCG/ML (10ML) SYRINGE FOR IV PUSH (FOR BLOOD PRESSURE SUPPORT)
PREFILLED_SYRINGE | INTRAVENOUS | Status: AC
Start: 2022-04-07 — End: ?
  Filled 2022-04-07: qty 10

## 2022-04-07 MED ORDER — LISINOPRIL 10 MG PO TABS
10.0000 mg | ORAL_TABLET | Freq: Every day | ORAL | Status: DC
  Administered 2022-04-07: 10 mg via ORAL
  Filled 2022-04-07: qty 1

## 2022-04-07 MED ORDER — LIDOCAINE HCL (CARDIAC) PF 100 MG/5ML IV SOSY
PREFILLED_SYRINGE | INTRAVENOUS | Status: DC | PRN
  Administered 2022-04-07: 100 mg via INTRAVENOUS

## 2022-04-07 MED ORDER — SUCCINYLCHOLINE CHLORIDE 200 MG/10ML IV SOSY
PREFILLED_SYRINGE | INTRAVENOUS | Status: AC
  Filled 2022-04-07: qty 20

## 2022-04-07 MED ORDER — VITAMIN B-12 100 MCG PO TABS
100.0000 ug | ORAL_TABLET | Freq: Every day | ORAL | Status: DC
  Administered 2022-04-08: 100 ug via ORAL
  Filled 2022-04-07: qty 1

## 2022-04-07 MED ORDER — LEVOTHYROXINE SODIUM 25 MCG PO TABS
137.0000 ug | ORAL_TABLET | Freq: Every day | ORAL | Status: DC
  Administered 2022-04-08: 137 ug via ORAL
  Filled 2022-04-07: qty 1

## 2022-04-07 MED ORDER — ACETAMINOPHEN 325 MG PO TABS
325.0000 mg | ORAL_TABLET | Freq: Four times a day (QID) | ORAL | Status: DC | PRN
  Administered 2022-04-08: 650 mg via ORAL
  Filled 2022-04-07: qty 2

## 2022-04-07 MED ORDER — BUPIVACAINE HCL (PF) 0.5 % IJ SOLN
INTRAMUSCULAR | Status: DC | PRN
  Administered 2022-04-07: 15 mL via PERINEURAL

## 2022-04-07 MED ORDER — CEFAZOLIN SODIUM-DEXTROSE 2-4 GM/100ML-% IV SOLN
2.0000 g | INTRAVENOUS | Status: AC
  Administered 2022-04-07: 2 g via INTRAVENOUS
  Filled 2022-04-07: qty 100

## 2022-04-07 MED ORDER — CHOLECALCIFEROL 10 MCG (400 UNIT) PO TABS
2000.0000 [IU] | ORAL_TABLET | Freq: Every day | ORAL | Status: DC
  Administered 2022-04-07 – 2022-04-08 (×2): 2000 [IU] via ORAL
  Filled 2022-04-07 (×2): qty 5

## 2022-04-07 MED ORDER — FENTANYL CITRATE PF 50 MCG/ML IJ SOSY
PREFILLED_SYRINGE | INTRAMUSCULAR | Status: AC
  Administered 2022-04-07: 50 ug via INTRAVENOUS
  Filled 2022-04-07: qty 2

## 2022-04-07 MED ORDER — DEXAMETHASONE SODIUM PHOSPHATE 10 MG/ML IJ SOLN
INTRAMUSCULAR | Status: DC | PRN
  Administered 2022-04-07: 5 mg via INTRAVENOUS

## 2022-04-07 MED ORDER — NITROGLYCERIN 0.4 MG SL SUBL: 0.4000 mg | SUBLINGUAL_TABLET | SUBLINGUAL | Status: DC | PRN

## 2022-04-07 MED ORDER — METHOCARBAMOL 500 MG PO TABS
500.0000 mg | ORAL_TABLET | Freq: Four times a day (QID) | ORAL | Status: DC | PRN
  Administered 2022-04-08: 500 mg via ORAL
  Filled 2022-04-07: qty 1

## 2022-04-07 MED ORDER — METOCLOPRAMIDE HCL 5 MG/ML IJ SOLN: 5.0000 mg | Freq: Three times a day (TID) | INTRAMUSCULAR | Status: DC | PRN

## 2022-04-07 MED ORDER — BUPIVACAINE-EPINEPHRINE (PF) 0.25% -1:200000 IJ SOLN
INTRAMUSCULAR | Status: DC | PRN
  Administered 2022-04-07: 12 mL

## 2022-04-07 MED ORDER — BUPIVACAINE-EPINEPHRINE (PF) 0.25% -1:200000 IJ SOLN
INTRAMUSCULAR | Status: AC
Start: 2022-04-07 — End: ?
  Filled 2022-04-07: qty 30

## 2022-04-07 MED ORDER — ONDANSETRON HCL 4 MG/2ML IJ SOLN: 4.0000 mg | Freq: Once | INTRAMUSCULAR | Status: DC | PRN

## 2022-04-07 MED ORDER — FENTANYL CITRATE PF 50 MCG/ML IJ SOSY
PREFILLED_SYRINGE | INTRAMUSCULAR | Status: AC
  Filled 2022-04-07: qty 1

## 2022-04-07 MED ORDER — ONDANSETRON HCL 4 MG/2ML IJ SOLN
4.0000 mg | Freq: Four times a day (QID) | INTRAMUSCULAR | Status: DC | PRN
  Administered 2022-04-07: 4 mg via INTRAVENOUS
  Filled 2022-04-07: qty 2

## 2022-04-07 MED ORDER — SODIUM CHLORIDE 0.9 % IV SOLN: INTRAVENOUS | Status: DC

## 2022-04-07 MED ORDER — SUCCINYLCHOLINE CHLORIDE 200 MG/10ML IV SOSY
PREFILLED_SYRINGE | INTRAVENOUS | Status: DC | PRN
  Administered 2022-04-07: 120 mg via INTRAVENOUS

## 2022-04-07 MED ORDER — ONDANSETRON HCL 4 MG PO TABS: 4.0000 mg | ORAL_TABLET | Freq: Four times a day (QID) | ORAL | Status: DC | PRN

## 2022-04-07 MED ORDER — MIDAZOLAM HCL 2 MG/2ML IJ SOLN
2.0000 mg | INTRAMUSCULAR | Status: DC
  Administered 2022-04-07: 1 mg via INTRAVENOUS
  Filled 2022-04-07: qty 2

## 2022-04-07 MED ORDER — DOXAZOSIN MESYLATE 4 MG PO TABS
4.0000 mg | ORAL_TABLET | Freq: Two times a day (BID) | ORAL | Status: DC
  Administered 2022-04-07 – 2022-04-08 (×2): 4 mg via ORAL
  Filled 2022-04-07 (×3): qty 1

## 2022-04-07 MED ORDER — OXYCODONE HCL 5 MG/5ML PO SOLN: 5.0000 mg | Freq: Once | ORAL | Status: AC | PRN

## 2022-04-07 MED ORDER — PROPOFOL 10 MG/ML IV BOLUS
INTRAVENOUS | Status: DC | PRN
  Administered 2022-04-07: 40 mg via INTRAVENOUS
  Administered 2022-04-07: 160 mg via INTRAVENOUS

## 2022-04-07 MED ORDER — METHOCARBAMOL 500 MG PO TABS: 500.0000 mg | ORAL_TABLET | Freq: Four times a day (QID) | ORAL | 1 refills | Status: AC | PRN

## 2022-04-07 MED ORDER — PHENOL 1.4 % MT LIQD: 1.0000 | OROMUCOSAL | Status: DC | PRN

## 2022-04-07 MED ORDER — DOCUSATE SODIUM 100 MG PO CAPS
100.0000 mg | ORAL_CAPSULE | Freq: Two times a day (BID) | ORAL | Status: DC
  Administered 2022-04-07 – 2022-04-08 (×2): 100 mg via ORAL
  Filled 2022-04-07 (×2): qty 1

## 2022-04-07 MED ORDER — DICLOFENAC SODIUM 1 % EX GEL
1.0000 | Freq: Four times a day (QID) | CUTANEOUS | Status: DC | PRN
Start: 2022-04-07 — End: 2022-04-08
  Filled 2022-04-07: qty 100

## 2022-04-07 MED ORDER — KETOROLAC TROMETHAMINE 30 MG/ML IJ SOLN
30.0000 mg | Freq: Once | INTRAMUSCULAR | Status: AC | PRN
  Administered 2022-04-07: 30 mg via INTRAVENOUS

## 2022-04-07 MED ORDER — ORAL CARE MOUTH RINSE: 15.0000 mL | Freq: Once | OROMUCOSAL | Status: AC

## 2022-04-07 MED ORDER — ATORVASTATIN CALCIUM 40 MG PO TABS
40.0000 mg | ORAL_TABLET | Freq: Every day | ORAL | Status: DC
  Administered 2022-04-08: 40 mg via ORAL
  Filled 2022-04-07 (×2): qty 1

## 2022-04-07 MED ORDER — OXYCODONE HCL 5 MG PO TABS
ORAL_TABLET | ORAL | Status: AC
  Filled 2022-04-07: qty 1

## 2022-04-07 MED ORDER — BUPIVACAINE LIPOSOME 1.3 % IJ SUSP
INTRAMUSCULAR | Status: DC | PRN
  Administered 2022-04-07: 10 mL via PERINEURAL

## 2022-04-07 MED ORDER — EPHEDRINE SULFATE-NACL 50-0.9 MG/10ML-% IV SOSY
PREFILLED_SYRINGE | INTRAVENOUS | Status: DC | PRN
  Administered 2022-04-07: 10 mg via INTRAVENOUS
  Administered 2022-04-07 (×3): 5 mg via INTRAVENOUS

## 2022-04-07 MED ORDER — LIDOCAINE HCL (PF) 2 % IJ SOLN
INTRAMUSCULAR | Status: AC
  Filled 2022-04-07: qty 10

## 2022-04-07 MED ORDER — METOCLOPRAMIDE HCL 5 MG PO TABS: 5.0000 mg | ORAL_TABLET | Freq: Three times a day (TID) | ORAL | Status: DC | PRN

## 2022-04-07 MED ORDER — HYDROMORPHONE HCL 1 MG/ML IJ SOLN
0.5000 mg | INTRAMUSCULAR | Status: DC | PRN
  Administered 2022-04-07: 1 mg via INTRAVENOUS
  Filled 2022-04-07: qty 1

## 2022-04-07 MED ORDER — PHENYLEPHRINE 80 MCG/ML (10ML) SYRINGE FOR IV PUSH (FOR BLOOD PRESSURE SUPPORT)
PREFILLED_SYRINGE | INTRAVENOUS | Status: DC | PRN
  Administered 2022-04-07: 160 ug via INTRAVENOUS
  Administered 2022-04-07: 80 ug via INTRAVENOUS
  Administered 2022-04-07 (×2): 120 ug via INTRAVENOUS
  Administered 2022-04-07: 160 ug via INTRAVENOUS

## 2022-04-07 MED ORDER — LACTATED RINGERS IV SOLN: INTRAVENOUS | Status: DC

## 2022-04-07 MED ORDER — OXYCODONE HCL 5 MG PO TABS: 5.0000 mg | ORAL_TABLET | ORAL | Status: DC | PRN

## 2022-04-07 MED ORDER — FENTANYL CITRATE PF 50 MCG/ML IJ SOSY
25.0000 ug | PREFILLED_SYRINGE | INTRAMUSCULAR | Status: DC | PRN
  Administered 2022-04-07 (×2): 50 ug via INTRAVENOUS

## 2022-04-07 MED ORDER — AMLODIPINE BESYLATE 5 MG PO TABS
5.0000 mg | ORAL_TABLET | Freq: Every day | ORAL | Status: DC
  Administered 2022-04-08: 5 mg via ORAL
  Filled 2022-04-07 (×2): qty 1

## 2022-04-07 MED ORDER — OXYCODONE HCL 5 MG PO TABS
5.0000 mg | ORAL_TABLET | Freq: Once | ORAL | Status: AC | PRN
  Administered 2022-04-07: 5 mg via ORAL

## 2022-04-07 MED ORDER — SODIUM CHLORIDE 0.9 % IR SOLN
Status: DC | PRN
  Administered 2022-04-07: 1000 mL

## 2022-04-07 MED ORDER — MENTHOL 3 MG MT LOZG: 1.0000 | LOZENGE | OROMUCOSAL | Status: DC | PRN

## 2022-04-07 MED ORDER — FENTANYL CITRATE (PF) 100 MCG/2ML IJ SOLN
INTRAMUSCULAR | Status: DC | PRN
  Administered 2022-04-07: 50 ug via INTRAVENOUS

## 2022-04-07 MED ORDER — PROPOFOL 10 MG/ML IV BOLUS
INTRAVENOUS | Status: AC
  Filled 2022-04-07: qty 20

## 2022-04-07 MED ORDER — LABETALOL HCL 100 MG PO TABS
300.0000 mg | ORAL_TABLET | Freq: Two times a day (BID) | ORAL | Status: DC
  Administered 2022-04-07 – 2022-04-08 (×2): 300 mg via ORAL
  Filled 2022-04-07 (×2): qty 3

## 2022-04-07 MED ORDER — FENTANYL CITRATE (PF) 100 MCG/2ML IJ SOLN
INTRAMUSCULAR | Status: AC
  Filled 2022-04-07: qty 2

## 2022-04-07 MED ORDER — METHOCARBAMOL 1000 MG/10ML IJ SOLN: 500.0000 mg | Freq: Four times a day (QID) | INTRAVENOUS | Status: DC | PRN

## 2022-04-07 MED ORDER — FENTANYL CITRATE PF 50 MCG/ML IJ SOSY
100.0000 ug | PREFILLED_SYRINGE | INTRAMUSCULAR | Status: DC
  Administered 2022-04-07: 50 ug via INTRAVENOUS
  Filled 2022-04-07: qty 2

## 2022-04-07 MED ORDER — CEFAZOLIN SODIUM-DEXTROSE 2-4 GM/100ML-% IV SOLN
2.0000 g | Freq: Four times a day (QID) | INTRAVENOUS | Status: AC
  Administered 2022-04-07 – 2022-04-08 (×3): 2 g via INTRAVENOUS
  Filled 2022-04-07 (×3): qty 100

## 2022-04-07 MED ORDER — KETOROLAC TROMETHAMINE 30 MG/ML IJ SOLN
INTRAMUSCULAR | Status: AC
  Filled 2022-04-07: qty 1

## 2022-04-07 MED ORDER — CHLORHEXIDINE GLUCONATE 0.12 % MT SOLN
15.0000 mL | Freq: Once | OROMUCOSAL | Status: AC
  Administered 2022-04-07: 15 mL via OROMUCOSAL

## 2022-04-07 MED ORDER — PHENYLEPHRINE HCL-NACL 20-0.9 MG/250ML-% IV SOLN
INTRAVENOUS | Status: DC | PRN
  Administered 2022-04-07: 25 ug/min via INTRAVENOUS

## 2022-04-07 MED ORDER — ASPIRIN 81 MG PO TBEC
81.0000 mg | DELAYED_RELEASE_TABLET | Freq: Every day | ORAL | Status: DC
  Administered 2022-04-08: 81 mg via ORAL
  Filled 2022-04-07: qty 1

## 2022-04-07 SURGICAL SUPPLY — 76 items
AID PSTN UNV HD RSTRNT DISP (MISCELLANEOUS) ×1
BAG COUNTER SPONGE SURGICOUNT (BAG) IMPLANT
BAG SPEC THK2 15X12 ZIP CLS (MISCELLANEOUS) ×1
BAG SPNG CNTER NS LX DISP (BAG)
BAG ZIPLOCK 12X15 (MISCELLANEOUS) ×1 IMPLANT
BIT DRILL 1.6MX128 (BIT) IMPLANT
BIT DRILL 170X2.5X (BIT) IMPLANT
BIT DRL 170X2.5X (BIT) ×1
BLADE SAG 18X100X1.27 (BLADE) ×2 IMPLANT
COOLER ICEMAN CLASSIC (MISCELLANEOUS) ×1 IMPLANT
COVER BACK TABLE 60X90IN (DRAPES) ×2 IMPLANT
COVER SURGICAL LIGHT HANDLE (MISCELLANEOUS) ×2 IMPLANT
CUP HUMERAL 42 PLUS 3 (Orthopedic Implant) ×1 IMPLANT
DRAPE INCISE IOBAN 66X45 STRL (DRAPES) ×2 IMPLANT
DRAPE ORTHO SPLIT 77X108 STRL (DRAPES) ×4
DRAPE SHEET LG 3/4 BI-LAMINATE (DRAPES) ×2 IMPLANT
DRAPE SURG ORHT 6 SPLT 77X108 (DRAPES) ×2 IMPLANT
DRAPE TOP 10253 STERILE (DRAPES) ×2 IMPLANT
DRAPE U-SHAPE 47X51 STRL (DRAPES) ×2 IMPLANT
DRILL 2.5 (BIT) ×2
DRSG ADAPTIC 3X8 NADH LF (GAUZE/BANDAGES/DRESSINGS) ×2 IMPLANT
DRSG PAD ABDOMINAL 8X10 ST (GAUZE/BANDAGES/DRESSINGS) ×2 IMPLANT
DURAPREP 26ML APPLICATOR (WOUND CARE) ×2 IMPLANT
ELECT BLADE TIP CTD 4 INCH (ELECTRODE) ×2 IMPLANT
ELECT NDL TIP 2.8 STRL (NEEDLE) ×1 IMPLANT
ELECT NEEDLE TIP 2.8 STRL (NEEDLE) ×2 IMPLANT
ELECT REM PT RETURN 15FT ADLT (MISCELLANEOUS) ×2 IMPLANT
EPIPHSYSI CENTER SZ 2 LT (Shoulder) ×2 IMPLANT
EPIPHYSIS CENTER SZ 2 LT (Shoulder) IMPLANT
FACESHIELD WRAPAROUND (MASK) ×2 IMPLANT
FACESHIELD WRAPAROUND OR TEAM (MASK) ×1 IMPLANT
GAUZE SPONGE 4X4 12PLY STRL (GAUZE/BANDAGES/DRESSINGS) ×2 IMPLANT
GLENOSPHERE XTEND LAT 42+0 STD (Miscellaneous) ×1 IMPLANT
GLOVE BIOGEL PI IND STRL 7.5 (GLOVE) ×1 IMPLANT
GLOVE BIOGEL PI IND STRL 8.5 (GLOVE) ×1 IMPLANT
GLOVE BIOGEL PI INDICATOR 7.5 (GLOVE) ×1
GLOVE BIOGEL PI INDICATOR 8.5 (GLOVE) ×1
GLOVE ORTHO TXT STRL SZ7.5 (GLOVE) ×2 IMPLANT
GLOVE SURG ORTHO 8.5 STRL (GLOVE) ×2 IMPLANT
GOWN STRL REUS W/ TWL XL LVL3 (GOWN DISPOSABLE) ×2 IMPLANT
GOWN STRL REUS W/TWL XL LVL3 (GOWN DISPOSABLE) ×4
KIT BASIN OR (CUSTOM PROCEDURE TRAY) ×2 IMPLANT
KIT TURNOVER KIT A (KITS) ×1 IMPLANT
MANIFOLD NEPTUNE II (INSTRUMENTS) ×2 IMPLANT
METAGLENE DELTA EXTEND (Trauma) IMPLANT
METAGLENE DXTEND (Trauma) ×2 IMPLANT
NDL MAYO CATGUT SZ4 TPR NDL (NEEDLE) ×1 IMPLANT
NEEDLE MAYO CATGUT SZ4 (NEEDLE) ×2 IMPLANT
NS IRRIG 1000ML POUR BTL (IV SOLUTION) ×2 IMPLANT
PACK SHOULDER (CUSTOM PROCEDURE TRAY) ×2 IMPLANT
PAD COLD SHLDR WRAP-ON (PAD) ×1 IMPLANT
PIN GUIDE 1.2 (PIN) ×1 IMPLANT
PIN GUIDE GLENOPHERE 1.5MX300M (PIN) ×1 IMPLANT
PIN METAGLENE 2.5 (PIN) ×1 IMPLANT
PROTECTOR NERVE ULNAR (MISCELLANEOUS) ×2 IMPLANT
RESTRAINT HEAD UNIVERSAL NS (MISCELLANEOUS) ×2 IMPLANT
SCREW 4.5X18MM (Screw) ×2 IMPLANT
SCREW BN 18X4.5XSTRL SHLDR (Screw) IMPLANT
SCREW LOCK 42 (Screw) ×2 IMPLANT
SLING ARM FOAM STRAP LRG (SOFTGOODS) ×1 IMPLANT
SMARTMIX MINI TOWER (MISCELLANEOUS)
SPIKE FLUID TRANSFER (MISCELLANEOUS) ×2 IMPLANT
SPONGE T-LAP 4X18 ~~LOC~~+RFID (SPONGE) ×2 IMPLANT
STEM 12 HA (Stem) ×1 IMPLANT
STRIP CLOSURE SKIN 1/2X4 (GAUZE/BANDAGES/DRESSINGS) ×2 IMPLANT
SUCTION FRAZIER HANDLE 10FR (MISCELLANEOUS) ×2
SUCTION TUBE FRAZIER 10FR DISP (MISCELLANEOUS) ×1 IMPLANT
SUT FIBERWIRE #2 38 T-5 BLUE (SUTURE) ×4
SUT MNCRL AB 4-0 PS2 18 (SUTURE) ×2 IMPLANT
SUT VIC AB 0 CT1 36 (SUTURE) ×4 IMPLANT
SUT VIC AB 0 CT2 27 (SUTURE) ×2 IMPLANT
SUT VIC AB 2-0 CT1 27 (SUTURE) ×2
SUT VIC AB 2-0 CT1 TAPERPNT 27 (SUTURE) ×1 IMPLANT
SUTURE FIBERWR #2 38 T-5 BLUE (SUTURE) ×2 IMPLANT
TOWEL OR 17X26 10 PK STRL BLUE (TOWEL DISPOSABLE) ×2 IMPLANT
TOWER SMARTMIX MINI (MISCELLANEOUS) IMPLANT

## 2022-04-07 NOTE — Transfer of Care (Signed)
Immediate Anesthesia Transfer of Care Note  Patient: [name]  Procedure(s) Performed: REVERSE SHOULDER ARTHROPLASTY (Left: Shoulder)  Patient Location: PACU  Anesthesia Type:GA combined with regional for post-op pain  Level of Consciousness: awake, alert , oriented and patient cooperative  Airway & Oxygen Therapy: Patient Spontanous Breathing and Patient connected to face mask oxygen  Post-op Assessment: Report given to RN and Post -op Vital signs reviewed and stable  Post vital signs: Reviewed and stable  Last Vitals:  Vitals Value Taken Time  BP 137/94 1323  Temp    Pulse 85 1323  Resp 15 1323  SpO2 100% 1323    Last Pain:  Vitals:   04/07/22 1049  TempSrc: Tympanic  PainSc: 0-No pain         Complications: No notable events documented.

## 2022-04-07 NOTE — Progress Notes (Signed)
Dr.Norris to bedside re dressed left shoulder due to bleeding from top of shoulder site on dressing. No further changes noted.

## 2022-04-07 NOTE — Anesthesia Procedure Notes (Signed)
Procedure Name: Intubation Date/Time: 04/07/2022 11:58 AM  Performed by: Niel Hummer, CRNAPre-anesthesia Checklist: Patient identified, Emergency Drugs available, Suction available and Patient being monitored Patient Re-evaluated:Patient Re-evaluated prior to induction Oxygen Delivery Method: Circle system utilized Preoxygenation: Pre-oxygenation with 100% oxygen Induction Type: IV induction Ventilation: Mask ventilation without difficulty Laryngoscope Size: Mac and 4 Grade View: Grade I Tube type: Oral Tube size: 7.0 mm Number of attempts: 1 Airway Equipment and Method: Stylet Placement Confirmation: ETT inserted through vocal cords under direct vision, positive ETCO2 and breath sounds checked- equal and bilateral Secured at: 22 cm Tube secured with: Tape Dental Injury: Teeth and Oropharynx as per pre-operative assessment

## 2022-04-07 NOTE — Anesthesia Procedure Notes (Signed)
Anesthesia Procedure Image    

## 2022-04-07 NOTE — Anesthesia Procedure Notes (Signed)
Anesthesia Regional Block: Interscalene brachial plexus block   Pre-Anesthetic Checklist: , timeout performed,  Correct Patient, Correct Site, Correct Laterality,  Correct Procedure, Correct Position, site marked,  Risks and benefits discussed,  Surgical consent,  Pre-op evaluation,  At surgeon's request and post-op pain management  Laterality: Left  Prep: chloraprep       Needles:  Injection technique: Single-shot  Needle Type: Echogenic Stimulator Needle     Needle Length: 9cm      Additional Needles:   Procedures:,,,, ultrasound used (permanent image in chart),,     Nerve Stimulator or Paresthesia:  Response: 0.55 mA  Additional Responses:   Narrative:  Start time: 04/07/2022 10:49 AM End time: 04/07/2022 10:59 AM Injection made incrementally with aspirations every 5 mL.  Performed by: Personally  Anesthesiologist: Eilene Ghazi, MD  Additional Notes: Patient tolerated the procedure well without complications

## 2022-04-07 NOTE — Anesthesia Postprocedure Evaluation (Signed)
Anesthesia Post Note  Patient: [name]  Procedure(s) Performed: REVERSE SHOULDER ARTHROPLASTY (Left: Shoulder)     Patient location during evaluation: PACU Anesthesia Type: General Level of consciousness: awake and alert Pain management: pain level controlled Vital Signs Assessment: post-procedure vital signs reviewed and stable Respiratory status: spontaneous breathing, nonlabored ventilation, respiratory function stable and patient connected to nasal cannula oxygen Cardiovascular status: blood pressure returned to baseline and stable Postop Assessment: no apparent nausea or vomiting Anesthetic complications: no   No notable events documented.  Last Vitals:  Vitals:   04/07/22 1500 04/07/22 1515  BP: (!) 142/83   Pulse: 63 63  Resp: 17 15  Temp: 36.7 C   SpO2: 95% 94%    Last Pain:  Vitals:   04/07/22 1515  TempSrc:   PainSc: 0-No pain                 Sachit Gilman S

## 2022-04-07 NOTE — Brief Op Note (Signed)
04/07/2022  1:22 PM  PATIENT:  [name]  61 y.o. male  PRE-OPERATIVE DIAGNOSIS:  left shoulder osteoarthritis, end stage, rotator cuff tear  POST-OPERATIVE DIAGNOSIS:  left shoulder osteoarthritis, end stage, rotator cuff tear  PROCEDURE:  Procedure(s) with comments: REVERSE SHOULDER ARTHROPLASTY (Left) - with ISB DePuy Delta Xtend with NO subscap repair  SURGEON:  Surgeon(s) and Role:    Beverely Low, MD - Primary  PHYSICIAN ASSISTANT:   ASSISTANTS: Thea Gist, PA-C   ANESTHESIA:   regional and general  EBL:  100 mL   BLOOD ADMINISTERED:none  DRAINS: none   LOCAL MEDICATIONS USED:  MARCAINE     SPECIMEN:  No Specimen  DISPOSITION OF SPECIMEN:  N/A  COUNTS:  YES  TOURNIQUET:  * No tourniquets in log *  DICTATION: .Other Dictation: Dictation Number [id number]  PLAN OF CARE: Admit for overnight observation  PATIENT DISPOSITION:  PACU - hemodynamically stable.   Delay start of Pharmacological VTE agent (>24hrs) due to surgical blood loss or risk of bleeding: not applicable

## 2022-04-07 NOTE — Interval H&P Note (Signed)
History and Physical Interval Note:  04/07/2022 10:54 AM  [name]  has presented today for surgery, with the diagnosis of left shoulder osteoarthritis.  The various methods of treatment have been discussed with the patient and family. After consideration of risks, benefits and other options for treatment, the patient has consented to  Procedure(s) with comments: REVERSE SHOULDER ARTHROPLASTY (Left) - with ISB as a surgical intervention.  The patient's history has been reviewed, patient examined, no change in status, stable for surgery.  I have reviewed the patient's chart and labs.  Questions were answered to the patient's satisfaction.     Verlee Rossetti

## 2022-04-07 NOTE — Discharge Instructions (Signed)
Ice to the shoulder constantly.  Keep the incision covered and clean and dry for one week, then ok to get it wet in the shower. ° °Do exercise as instructed several times per day. ° °DO NOT reach behind your back or push up out of a chair with the operative arm. ° °Use a sling while you are up and around for comfort, may remove while seated.  Keep pillow propped behind the operative elbow. ° °Follow up with Dr Sharai Overbay in two weeks in the office, call [phone] for appt °

## 2022-04-07 NOTE — Op Note (Unsigned)
NAMEGIROLAMO, [name] MEDICAL RECORD NO: [id number] ACCOUNT NO: [id number] DATE OF BIRTH: 1960-12-27 FACILITY: [facility] LOCATION: [id number] PHYSICIAN: Almedia Balls. Ranell Patrick, MD  Operative Report   DATE OF PROCEDURE: 04/07/2022  PREOPERATIVE DIAGNOSIS:  Left shoulder end-stage arthritis with rotator cuff insufficiency.  POSTOPERATIVE DIAGNOSIS:  Left shoulder end-stage arthritis with rotator cuff insufficiency.  PROCEDURE PERFORMED:  Left reverse shoulder replacement using DePuy Delta Xtend prosthesis with no subscap repair.  ATTENDING SURGEON:  Almedia Balls. Ranell Patrick, MD  ASSISTANT:  Modesto Charon, MD, PA-C, who was scrubbed during the entire procedure, and necessary for satisfactory completion of surgery.  ANESTHESIA:  General anesthesia was used plus interscalene block.  ESTIMATED BLOOD LOSS:  100 mL  FLUID REPLACEMENT:  1500 mL crystalloid.  INSTRUMENT COUNTS:  Correct.  COMPLICATIONS:  No complications.  ANTIBIOTICS:  Perioperative antibiotics were given.  INDICATIONS:  The patient is a 61 year old male with worsening left shoulder pain and dysfunction secondary to end-stage arthritis and rotator cuff tear arthropathy.  The patient has failed an extended period of conservative management, presents for  operative treatment to relieve pain and restore function.  Informed consent obtained.  DESCRIPTION OF PROCEDURE:  After an adequate level of anesthesia was achieved, the patient was positioned in modified beach chair position.  Left shoulder correctly identified and sterilely prepped and draped in the usual manner.  Timeout called,  verifying correct patient, correct site. We began on the patient's shoulder using a standard deltopectoral approach, starting at the coracoid process extending down to the anterior humerus.  Dissection down through subcutaneous tissues using Bovie.  We  identified the cephalic vein and took that laterally with the deltoid, pectoralis taken medially.  Conjoined  tendon identified and retracted medially.  Deep retractors were placed.  We then tenodesed the biceps in situ with 0 Vicryl figure-of-eight  suture x 2.  Next, we released the subscap remnant, which was in poor shape and not repairable.  We did tag the tendon for protection of the axillary nerve.  We also released the inferior capsule actually rotating the humerus.  We then extended the  shoulder and delivered the humerus out of the wound.  There was extensive tearing of the rotator cuff back to the teres minor, which was preserved.  We entered the proximal humerus, which had arthritis on it with some loss of cartilage.  We entered with  a 6 mm reamer, we reamed up to a size 10 mm reamer.  We then placed the T-handled 10 mm guide and resected the head at 20 degrees of retroversion with the oscillating saw.  Next, we removed the head at the back table and used it for bone graft for later.   We then removed excess osteophytes medially with a rongeur.  Next, we subluxed the humerus posteriorly getting good exposure of the glenoid.  We removed the glenoid labrum and also the capsule.  We were careful to protect the axillary nerve throughout  this portion of the procedure.  Once we had good exposure of the face.  We removed the remaining cartilage with a Cobb elevator and rongeur, getting down to the bone.  We used the guide and placed our guide pin for the metaglene baseplate reamer.  Once  we had our guide placed low on the glenoid, we reamed down to subchondral bone.  We went ahead and did our peripheral hand reaming and then removed the guide pin and we also drilled our central peg hole.  Next, we impacted the metaglene  baseplate into  position, referencing off the 12 and 6 o'clock position on the scapula.  We placed a 42 screw inferiorly, a 42 at the base of the coracoid and then 18 nonlocked posteriorly.  We locked the superior and inferior 42 screws.  We had excellent baseplate  stability and support and  with the screws in place, we selected a real 42 +0 glenosphere and we attached that to the baseplate using the screwdriver. With the glenosphere firmly attached, I did a finger sweep to make sure we had no soft tissue caught up  in that bearing.  At this point, we went back to the humeral side and reamed for the 2 left metaphysis.  Once we did that, we trialled with the 10 body 2 left metaphysis set on the 0 setting and placed in 20 degrees of retroversion.  With the stem in  place, we selected the trial 42+3 poly and placed it on the humeral tray.  Reducing the shoulder, we had excellent soft tissue balancing.  No gapping with inferior pole or external rotation and appropriate conjoined tensioning.  We removed the trial  components, irrigating thoroughly.  We then used available bone graft from the humeral head and using impaction grafting technique with the HA coated press-fit 10 stem and 2 right metaphysis, we impacted the humeral component in place.  Again, we had  very good support for the stem and it was at the appropriate height, so we selected the real 42+3 polyethylene and impacted that on the humeral tray, reducing the shoulder, had nice little pop as it reduced and we had a stable shoulder throughout a full  range of motion with no impingement.  At this point, we irrigated thoroughly, resected the subscap remnant and then repaired the deltopectoral interval with 0 Vicryl suture followed by 2-0 Vicryl for subcutaneous closure and 4-0 Monocryl for skin.   Steri-Strips applied followed by sterile dressing.  The patient tolerated surgery well.   MUK D: 04/07/2022 1:29:20 pm T: 04/07/2022 9:48:00 pm  JOB: [id number]/ [id number]

## 2022-04-08 DIAGNOSIS — Z87891 Personal history of nicotine dependence: Secondary | ICD-10-CM | POA: Diagnosis not present

## 2022-04-08 DIAGNOSIS — I10 Essential (primary) hypertension: Secondary | ICD-10-CM | POA: Diagnosis not present

## 2022-04-08 DIAGNOSIS — M19012 Primary osteoarthritis, left shoulder: Secondary | ICD-10-CM | POA: Diagnosis not present

## 2022-04-08 DIAGNOSIS — Z79899 Other long term (current) drug therapy: Secondary | ICD-10-CM | POA: Diagnosis not present

## 2022-04-08 DIAGNOSIS — I2510 Atherosclerotic heart disease of native coronary artery without angina pectoris: Secondary | ICD-10-CM | POA: Diagnosis not present

## 2022-04-08 DIAGNOSIS — E039 Hypothyroidism, unspecified: Secondary | ICD-10-CM | POA: Diagnosis not present

## 2022-04-08 DIAGNOSIS — Z7982 Long term (current) use of aspirin: Secondary | ICD-10-CM | POA: Diagnosis not present

## 2022-04-08 NOTE — Discharge Summary (Signed)
In most cases prophylactic antibiotics for Dental procdeures after total joint surgery are not necessary.  Exceptions are as follows:  1. History of prior total joint infection  2. Severely immunocompromised (Organ Transplant, cancer chemotherapy, Rheumatoid biologic meds such as Humera)  3. Poorly controlled diabetes (A1C &gt; 8.0, blood glucose over 200)  If you have one of these conditions, contact your surgeon for an antibiotic prescription, prior to your dental procedure. Orthopedic Discharge Summary        Physician Discharge Summary  Patient ID: [name] MRN: [id number] DOB/AGE: 06/04/1961 61 y.o.  Admit date: 04/07/2022 Discharge date: 04/08/2022   Procedures:  Procedure(s) (LRB): REVERSE SHOULDER ARTHROPLASTY (Left)  Attending Physician:  Dr. Malon Kindle  Admission Diagnoses:   left shoulder OA, end stage  Discharge Diagnoses:  same   Past Medical History:  Diagnosis Date   Anxiety    Arthritis    neck and knee   Barrett's esophagus    Bipolar 1 disorder (HCC)    CAD in native artery    Cervical disc syndrome    Depression    GERD (gastroesophageal reflux disease)    Grave's disease    Graves' disease 10/04/2011   H/O prostatitis    Headache    Hiatal hernia    Hypertension    Hypothyroidism    Lumbar degenerative disc disease    Mild carotid artery disease (HCC)    Mixed hyperlipidemia 12/27/2017   Myocardial infarction (HCC) 2000   Mild heart attack   Stenosis of left vertebrobasilar artery    Stenosis of right subclavian artery (HCC)    Tobacco abuse 12/27/2017    PCP: Emilio Aspen, MD   Discharged Condition: good  Hospital Course:  Patient underwent the above stated procedure on 04/07/2022. Patient tolerated the procedure well and brought to the recovery room in good condition and subsequently to the floor. Patient had an uncomplicated hospital course and was stable for discharge.   Disposition: Discharge  disposition: 01-Home or Self Care      with follow up in 2 weeks    Follow-up Information     Beverely Low, MD. Call in 2 week(s).   Specialty: Orthopedic Surgery Why: call [phone] for follow up appt Contact information: [address] [place] [address] [phone]                 Dental Antibiotics:  In most cases prophylactic antibiotics for Dental procdeures after total joint surgery are not necessary.  Exceptions are as follows:  1. History of prior total joint infection  2. Severely immunocompromised (Organ Transplant, cancer chemotherapy, Rheumatoid biologic meds such as Humera)  3. Poorly controlled diabetes (A1C &gt; 8.0, blood glucose over 200)  If you have one of these conditions, contact your surgeon for an antibiotic prescription, prior to your dental procedure.  Discharge Instructions     Call MD / Call 911   Complete by: As directed    If you experience chest pain or shortness of breath, CALL 911 and be transported to the hospital emergency room.  If you develope a fever above 101 F, pus (white drainage) or increased drainage or redness at the wound, or calf pain, call your surgeon's office.   Constipation Prevention   Complete by: As directed    Drink plenty of fluids.  Prune juice may be helpful.  You may use a stool softener, such as Colace (over the counter) 100 mg twice a day.  Use MiraLax (over the counter) for constipation as needed.   Diet - low sodium heart healthy   Complete by: As directed    Increase activity slowly as tolerated   Complete by: As directed    Post-operative opioid taper instructions:   Complete by: As directed    POST-OPERATIVE OPIOID TAPER INSTRUCTIONS: It is important to wean off of your opioid medication as soon as possible. If you do not need pain medication after your surgery it is ok to stop day one. Opioids include: Codeine, Hydrocodone(Norco, Vicodin), Oxycodone(Percocet, oxycontin)  and hydromorphone amongst others.  Long term and even short term use of opiods can cause: Increased pain response Dependence Constipation Depression Respiratory depression And more.  Withdrawal symptoms can include Flu like symptoms Nausea, vomiting And more Techniques to manage these symptoms Hydrate well Eat regular healthy meals Stay active Use relaxation techniques(deep breathing, meditating, yoga) Do Not substitute Alcohol to help with tapering If you have been on opioids for less than two weeks and do not have pain than it is ok to stop all together.  Plan to wean off of opioids This plan should start within one week post op of your joint replacement. Maintain the same interval or time between taking each dose and first decrease the dose.  Cut the total daily intake of opioids by one tablet each day Next start to increase the time between doses. The last dose that should be eliminated is the evening dose.          Allergies as of 04/08/2022       Reactions   Bee Venom Anaphylaxis   Cyclobenzaprine    hypotension        Medication List     TAKE these medications    amLODipine 5 MG tablet Commonly known as: NORVASC Take 5 mg by mouth daily.   aspirin EC 81 MG tablet Take 81 mg by mouth daily. Swallow whole.   atorvastatin 40 MG tablet Commonly known as: LIPITOR Take 1 tablet (40 mg total) by mouth daily. TAKE 1 TABLET BY MOUTH ONCE DAILY What changed:  when to take this additional instructions   B-12 PO Take 1 capsule by mouth daily.   buPROPion 150 MG 24 hr tablet Commonly known as: WELLBUTRIN XL Take 450 mg by mouth daily.   diclofenac Sodium 1 % Gel Commonly known as: VOLTAREN Apply 1 Application topically 4 (four) times daily as needed (pain).   doxazosin 4 MG tablet Commonly known as: CARDURA Take 4 mg by mouth 2 (two) times daily.   EpiPen 2-Pak 0.3 mg/0.3 mL Soaj injection Generic drug: EPINEPHrine Inject 0.3 mg into the muscle as  needed for anaphylaxis.   labetalol 300 MG tablet Commonly known as: NORMODYNE Take 300 mg by mouth 2 (two) times daily.   levothyroxine 137 MCG tablet Commonly known as: SYNTHROID Take 137 mcg by mouth daily before breakfast.   lisinopril 10 MG tablet Commonly known as: ZESTRIL Take 10 mg by mouth at bedtime.   methocarbamol 500 MG tablet Commonly known as: ROBAXIN Take 1 tablet (500 mg total) by mouth every 6 (six) hours as needed for muscle spasms.   nitroGLYCERIN 0.4 MG SL tablet Commonly known as: NITROSTAT Place 0.4 mg under the tongue every 5 (five) minutes as needed for chest pain.   omeprazole 20 MG capsule Commonly known as: PRILOSEC Take 20 mg by mouth daily.   oxycodone 30 MG immediate release tablet Commonly known as: ROXICODONE Take 30 mg by mouth  3 (three) times daily as needed for pain.   QUEtiapine 100 MG tablet Commonly known as: SEROQUEL Take 300 mg by mouth at bedtime.   Vitamin D 50 MCG (2000 UT) tablet Take 2,000 Units by mouth daily.          Signed: Verlee Rossetti 04/08/2022, 8:14 AM  [facility] is now [facility] [address], [place], [place] [address] Phone: [phone] Facebook  Instagram  [employer]

## 2022-04-08 NOTE — Progress Notes (Signed)
Pt stable at time of d/c instructions and education. No needs at this time. Pt dressing clean, dry, and intact.  

## 2022-04-08 NOTE — Plan of Care (Signed)
  Problem: Clinical Measurements: Goal: Diagnostic test results will improve Outcome: Progressing   Problem: Clinical Measurements: Goal: Respiratory complications will improve Outcome: Progressing   Problem: Clinical Measurements: Goal: Cardiovascular complication will be avoided Outcome: Progressing   Problem: Nutrition: Goal: Adequate nutrition will be maintained Outcome: Progressing   Problem: Coping: Goal: Level of anxiety will decrease Outcome: Progressing   Problem: Pain Managment: Goal: General experience of comfort will improve Outcome: Progressing   Problem: Safety: Goal: Ability to remain free from injury will improve Outcome: Progressing   

## 2022-04-08 NOTE — Evaluation (Signed)
Occupational Therapy Evaluation Patient Details Name: [name] MRN: [id number] DOB: 1961-08-08 Today's Date: 04/08/2022   History of Present Illness Patient is a 61 year old male who was admitted with a left shoulder end stage arthritis with rotator cuff insufficiency. patient underwent left reverse shoulder replacement on 04/07/22. PMH: graves disease, cervical disc syndrome, bipolar disorder, stenosis of left vertebrobasilar artery, stenosis of right subclavian artery   Clinical Impression   s/p shoulder replacement without functional use of left non dominant upper extremity secondary to effects of surgery and interscalene block and shoulder precautions. Therapist provided education and instruction to patient in regards to exercises, precautions, positioning, donning upper extremity clothing and bathing while maintaining shoulder precautions, ice and edema management and donning/doffing sling. Patient verbalized understanding and demonstrated as needed. Patient needed assistance to donn shirt, underwear, pants, socks and shoes and provided with instruction on compensatory strategies to perform ADLs. Patient to follow up with MD for further therapy needs.        Recommendations for follow up therapy are one component of a multi-disciplinary discharge planning process, led by the attending physician.  Recommendations may be updated based on patient status, additional functional criteria and insurance authorization.   Follow Up Recommendations  Follow physician's recommendations for discharge plan and follow up therapies    Assistance Recommended at Discharge PRN  Patient can return home with the following Assistance with cooking/housework    Functional Status Assessment  Patient has had a recent decline in their functional status and demonstrates the ability to make significant improvements in function in a reasonable and predictable amount of time.  Equipment Recommendations  None  recommended by OT    Recommendations for Other Services       Precautions / Restrictions Precautions Precautions: Shoulder Type of Shoulder Precautions: No ROM shoulder, OK for hand wrist and elbow Shoulder Interventions: Shoulder sling/immobilizer;At all times;Off for dressing/bathing/exercises Precaution Booklet Issued: Yes (comment) (handout) Restrictions Weight Bearing Restrictions: Yes LUE Weight Bearing: Non weight bearing      Mobility Bed Mobility                    Transfers                          Balance                                           ADL either performed or assessed with clinical judgement   ADL                                               Vision Baseline Vision/History: 1 Wears glasses Patient Visual Report: No change from baseline       Perception     Praxis      Pertinent Vitals/Pain Pain Assessment Pain Assessment: No/denies pain (block in place)     Hand Dominance Right   Extremity/Trunk Assessment Upper Extremity Assessment Upper Extremity Assessment: LUE deficits/detail LUE Deficits / Details: shoulder surgery with block still in place. able to move digits, wrist and elbow   Lower Extremity Assessment Lower Extremity Assessment: Overall WFL for tasks assessed   Cervical / Trunk Assessment Cervical / Trunk Assessment: Normal  Communication Communication Communication: No difficulties   Cognition Arousal/Alertness: Awake/alert Behavior During Therapy: WFL for tasks assessed/performed Overall Cognitive Status: Within Functional Limits for tasks assessed                                       General Comments       Exercises     Shoulder Instructions Shoulder Instructions Donning/doffing shirt without moving shoulder: Minimal assistance (block in place) Method for sponge bathing under operated UE: Minimal assistance (block in  place) Donning/doffing sling/immobilizer: Minimal assistance (block in place) Correct positioning of sling/immobilizer: Supervision/safety ROM for elbow, wrist and digits of operated UE: Modified independent Sling wearing schedule (on at all times/off for ADL's): Modified independent Proper positioning of operated UE when showering: Modified independent Positioning of UE while sleeping: Modified independent    Home Living Family/patient expects to be discharged to:: Private residence Living Arrangements: Parent Available Help at Discharge: Family;Available 24 hours/day                                    Prior Functioning/Environment Prior Level of Function : Independent/Modified Independent                        OT Problem List: Impaired UE functional use;Pain;Decreased knowledge of precautions;Decreased activity tolerance      OT Treatment/Interventions: Self-care/ADL training;Patient/family education;Therapeutic activities    OT Goals(Current goals can be found in the care plan section) Acute Rehab OT Goals OT Goal Formulation: All assessment and education complete, DC therapy  OT Frequency:      Co-evaluation              AM-PAC OT "6 Clicks" Daily Activity     Outcome Measure Help from another person eating meals?: None Help from another person taking care of personal grooming?: None Help from another person toileting, which includes using toliet, bedpan, or urinal?: None Help from another person bathing (including washing, rinsing, drying)?: A Little Help from another person to put on and taking off regular upper body clothing?: A Little Help from another person to put on and taking off regular lower body clothing?: None 6 Click Score: 22   End of Session Nurse Communication: Other (comment) (ok to participate)  Activity Tolerance: Patient tolerated treatment well Patient left: in chair;with call bell/phone within reach  OT Visit  Diagnosis: Pain Pain - Right/Left: Left Pain - part of body: Shoulder                Time: 7673-4193 OT Time Calculation (min): 35 min Charges:  OT General Charges $OT Visit: 1 Visit OT Evaluation $OT Eval Low Complexity: 1 Low OT Treatments $Self Care/Home Management : 8-22 mins  Sharyn Blitz OTR/L, MS Acute Rehabilitation Department Office# [phone] Pager# [phone]   Ardyth Harps 04/08/2022, 8:29 AM

## 2022-04-08 NOTE — Progress Notes (Signed)
Subjective: Patient without complaints. Block controlling pain well   Objective: Vital signs in last 24 hours: Temp:  [97.7 F (36.5 C)-99.1 F (37.3 C)] 98.3 F (36.8 C) (08/05 0627) Pulse Rate:  [58-87] 87 (08/05 0627) Resp:  [7-35] 18 (08/05 0627) BP: (115-174)/(70-134) 135/82 (08/05 0627) SpO2:  [93 %-100 %] 96 % (08/05 0627) Weight:  [73 kg] 73 kg (08/04 1600)  Intake/Output from previous day: 08/04 0701 - 08/05 0700 In: 4165.5 [P.O.:1050; I.V.:2815.5; IV Piggyback:300.1] Out: 1900 [Urine:1795; Blood:105] Intake/Output this shift: Total I/O In: 386.3 [P.O.:360; I.V.:26.3] Out: -   No results for input(s): "HGB" in the last 72 hours. No results for input(s): "WBC", "RBC", "HCT", "PLT" in the last 72 hours. No results for input(s): "NA", "K", "CL", "CO2", "BUN", "CREATININE", "GLUCOSE", "CALCIUM" in the last 72 hours. No results for input(s): "LABPT", "INR" in the last 72 hours.  Neurologically intact Neurovascular intact No cellulitis present Dressing changed- Incision clean with no erythema or exudate    Assessment/Plan: S/P Left RSA- Doing well. Discharge today after OT    Ollen Gross 04/08/2022, 7:48 AM

## 2022-04-08 NOTE — TOC CM/SW Note (Signed)
  Transition of Care [facility]) Screening Note   Patient Details  Name: [name] Date of Birth: [birth year]   [facility]) CM/SW Contact:    Darleene Cleaver, LCSW Phone Number: 04/08/2022, 12:20 PM    Transition of Care Department [facility]) has reviewed patient and no TOC needs have been identified at this time. We will continue to monitor patient advancement through interdisciplinary progression rounds. If new patient transition needs arise, please place a TOC consult.

## 2022-04-08 NOTE — Plan of Care (Signed)
Pt stable condition at this time. Pt to d/c home with family. No needs at this time.

## 2022-04-10 ENCOUNTER — Encounter (HOSPITAL_COMMUNITY): Payer: Self-pay | Admitting: Orthopedic Surgery

## 2022-04-20 DIAGNOSIS — I10 Essential (primary) hypertension: Secondary | ICD-10-CM | POA: Diagnosis not present

## 2022-04-20 DIAGNOSIS — E781 Pure hyperglyceridemia: Secondary | ICD-10-CM | POA: Diagnosis not present

## 2022-04-20 DIAGNOSIS — Z4789 Encounter for other orthopedic aftercare: Secondary | ICD-10-CM | POA: Diagnosis not present

## 2022-04-20 DIAGNOSIS — M4722 Other spondylosis with radiculopathy, cervical region: Secondary | ICD-10-CM | POA: Diagnosis not present

## 2022-04-20 DIAGNOSIS — E039 Hypothyroidism, unspecified: Secondary | ICD-10-CM | POA: Diagnosis not present

## 2022-05-18 DIAGNOSIS — Z4789 Encounter for other orthopedic aftercare: Secondary | ICD-10-CM | POA: Diagnosis not present

## 2022-06-03 DIAGNOSIS — Z23 Encounter for immunization: Secondary | ICD-10-CM | POA: Diagnosis not present

## 2022-06-14 ENCOUNTER — Telehealth (HOSPITAL_COMMUNITY): Payer: Self-pay

## 2022-06-14 NOTE — Telephone Encounter (Signed)
Called to schedule cta head/neck, no answer, left vm. AW 

## 2022-07-06 IMAGING — RF DG CERVICAL SPINE 2 OR 3 VIEWS
1 series · 2 of 2 positions shown · non-contrast
Comparison: 03/06/2018

CLINICAL DATA: Anterior cervical decompression

EXAM:
CERVICAL SPINE - 2-3 VIEW

[Series 1: run · 2 of 2 slices shown]
[im 1/2]
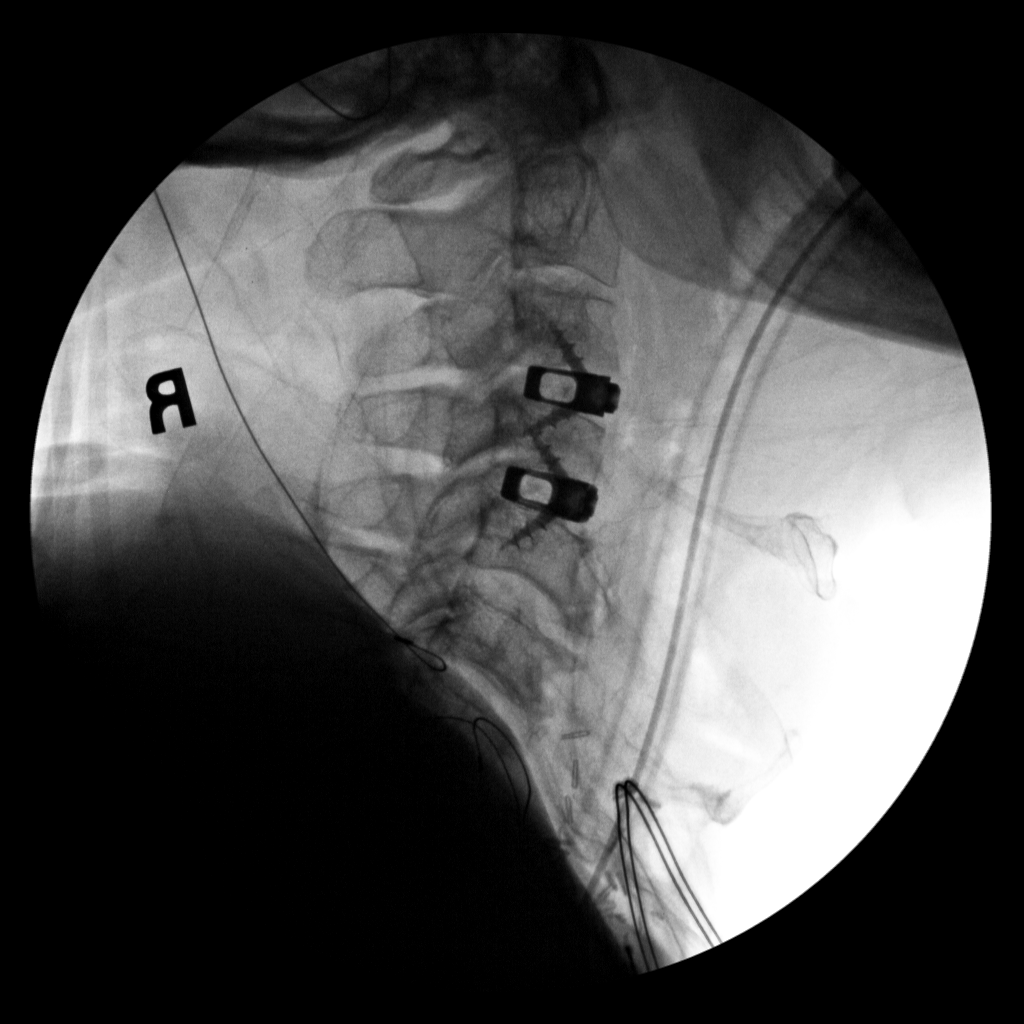
[im 2/2]
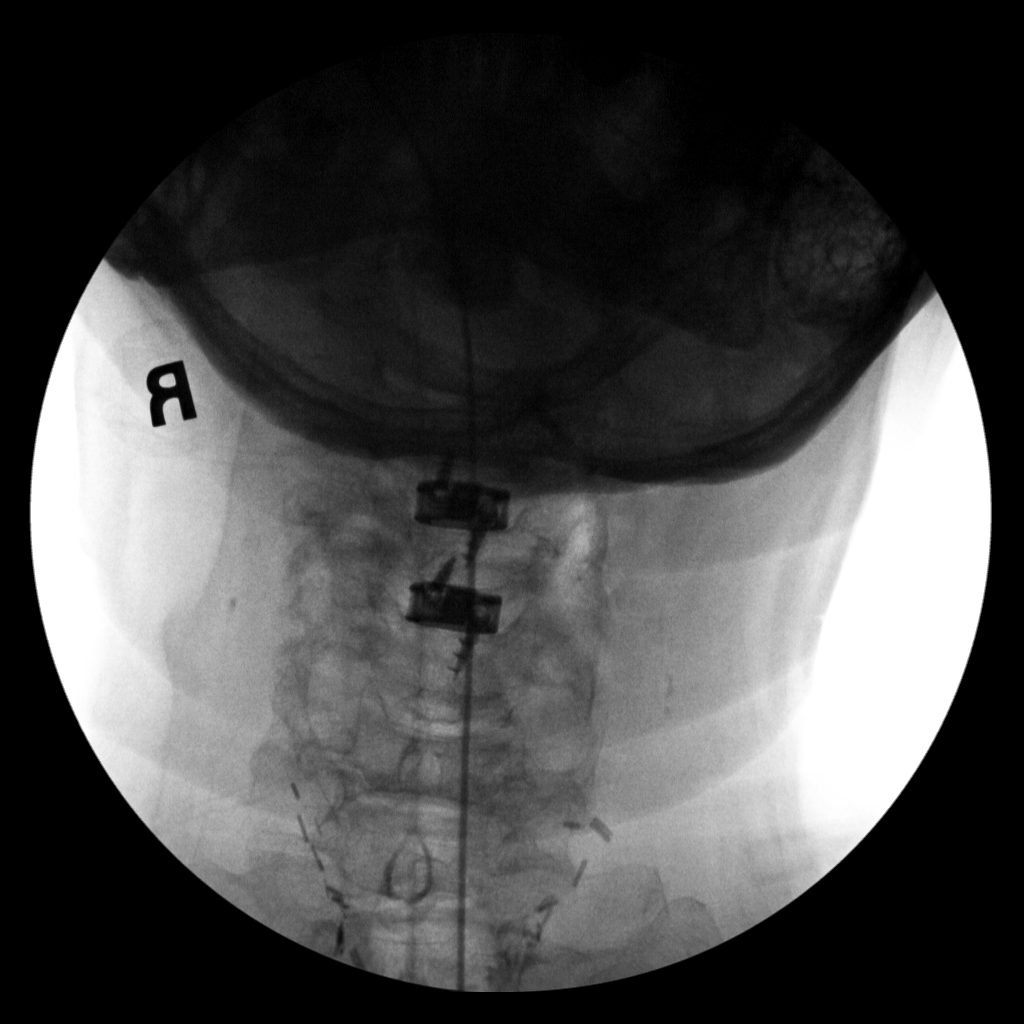

[2 of 2 positions shown; findings below may reference images not displayed]

FINDINGS: Two low resolution intraoperative spot views of the cervical spine.
Total fluoroscopy time was 52 seconds. The images demonstrate post
fusion changes at C3-C4 and C4-C5.
IMPRESSION: Intraoperative fluoroscopic assistance provided during cervical
spine surgery

## 2022-07-16 ENCOUNTER — Other Ambulatory Visit: Payer: Self-pay | Admitting: Physician Assistant

## 2022-07-25 DIAGNOSIS — Z4889 Encounter for other specified surgical aftercare: Secondary | ICD-10-CM | POA: Diagnosis not present

## 2022-08-02 ENCOUNTER — Encounter: Payer: Self-pay | Admitting: Interventional Cardiology

## 2022-08-02 ENCOUNTER — Ambulatory Visit: Payer: Medicare Other | Attending: Physician Assistant | Admitting: Interventional Cardiology

## 2022-08-02 VITALS — BP 130/82 | HR 62 | Ht 69.0 in | Wt 162.0 lb

## 2022-08-02 DIAGNOSIS — I6523 Occlusion and stenosis of bilateral carotid arteries: Secondary | ICD-10-CM | POA: Diagnosis not present

## 2022-08-02 DIAGNOSIS — I358 Other nonrheumatic aortic valve disorders: Secondary | ICD-10-CM

## 2022-08-02 DIAGNOSIS — I2510 Atherosclerotic heart disease of native coronary artery without angina pectoris: Secondary | ICD-10-CM | POA: Diagnosis not present

## 2022-08-02 DIAGNOSIS — E785 Hyperlipidemia, unspecified: Secondary | ICD-10-CM | POA: Diagnosis not present

## 2022-08-02 DIAGNOSIS — I10 Essential (primary) hypertension: Secondary | ICD-10-CM | POA: Diagnosis not present

## 2022-08-02 NOTE — Progress Notes (Signed)
Cardiology Office Note:    Date:  08/02/2022   ID:  [name], DOB [birth year], MRN [id number]  PCP:  Emilio Aspen, MD  Cardiologist:  Lesleigh Noe, MD   Referring MD: Emilio Aspen, *   Chief Complaint  Patient presents with   Coronary Artery Disease    History of Present Illness:    [name] is a 61 y.o. male with a hx of  moderate nonobstructive CAD by cath 2019, hyperlipidemia, hypertension, tobacco abuse, bipolar disorder, and Barrett's esophagus.    Has undergone left shoulder reconstruction and cervical spine fixation over the past 18 months without cardiac complications.  He is now recuperated from both operations.  Had no cardiac complaints in the interval since the operations when he was involved in rehab.  He has not used nitroglycerin in greater than 6 months.  Not getting in much walking.  I encouraged 30 minutes of moderate activity 5 out of 7 days of the week.  Past Medical History:  Diagnosis Date   Anxiety    Arthritis    neck and knee   Barrett's esophagus    Bipolar 1 disorder (HCC)    CAD in native artery    Cervical disc syndrome    Depression    GERD (gastroesophageal reflux disease)    Grave's disease    Graves' disease 10/04/2011   H/O prostatitis    Headache    Hiatal hernia    Hypertension    Hypothyroidism    Lumbar degenerative disc disease    Mild carotid artery disease (HCC)    Mixed hyperlipidemia 12/27/2017   Myocardial infarction (HCC) 2000   Mild heart attack   Stenosis of left vertebrobasilar artery    Stenosis of right subclavian artery (HCC)    Tobacco abuse 12/27/2017    Past Surgical History:  Procedure Laterality Date   ANTERIOR CERVICAL DECOMP/DISCECTOMY FUSION N/A 07/13/2021   Procedure: ANTERIOR CERVICAL DECOMPRESSION/DISCECTOMY FUSION CERVICAL THREE-FOUR, CERVICAL FOUR -FIVE;  Surgeon: Venita Lick, MD;  Location: [facility] OR;  Service: Orthopedics;  Laterality: N/A;  ANTERIOR CERVICAL  DECOMPRESSION/DISCECTOMY FUSION CERVICAL THREE-FOUR, CERVICAL FOUR -FIVE   CARDIAC CATHETERIZATION  2000   CHOLECYSTECTOMY  12/11/2013   DR Andrey Campanile    CHOLECYSTECTOMY N/A 12/11/2013   Procedure: LAPAROSCOPIC CHOLECYSTECTOMY WITH INTRAOPERATIVE CHOLANGIOGRAM;  Surgeon: Atilano Ina, MD;  Location: [facility] OR;  Service: General;  Laterality: N/A;   IR ANGIO INTRA EXTRACRAN SEL COM CAROTID INNOMINATE BILAT MOD SED  05/02/2018   IR ANGIO VERTEBRAL SEL VERTEBRAL BILAT MOD SED  05/02/2018   KNEE SURGERY     LEFT HEART CATH AND CORONARY ANGIOGRAPHY N/A 03/12/2018   Procedure: LEFT HEART CATH AND CORONARY ANGIOGRAPHY;  Surgeon: Lyn Records, MD;  Location: [facility] INVASIVE CV LAB;  Service: Cardiovascular;  Laterality: N/A;   REVERSE SHOULDER ARTHROPLASTY Left 04/07/2022   Procedure: REVERSE SHOULDER ARTHROPLASTY;  Surgeon: Beverely Low, MD;  Location: [facility] ORS;  Service: Orthopedics;  Laterality: Left;  with ISB   testicular torsion surgery     THYROIDECTOMY      Current Medications: Current Meds  Medication Sig   amLODipine (NORVASC) 5 MG tablet Take 5 mg by mouth daily.   aspirin EC 81 MG tablet Take 81 mg by mouth daily. Swallow whole.   atorvastatin (LIPITOR) 40 MG tablet Take 1 tablet by mouth once daily   buPROPion (WELLBUTRIN XL) 150 MG 24 hr tablet Take 450 mg by mouth daily.   Cholecalciferol (VITAMIN D)  50 MCG (2000 UT) tablet Take 2,000 Units by mouth daily.   Cyanocobalamin (B-12 PO) Take 1 capsule by mouth daily.   diclofenac Sodium (VOLTAREN) 1 % GEL Apply 1 Application topically 4 (four) times daily as needed (pain).   doxazosin (CARDURA) 4 MG tablet Take 4 mg by mouth 2 (two) times daily.   EPINEPHrine (EPIPEN 2-PAK) 0.3 mg/0.3 mL IJ SOAJ injection Inject 0.3 mg into the muscle as needed for anaphylaxis.   labetalol (NORMODYNE) 300 MG tablet Take 300 mg by mouth 2 (two) times daily.   levothyroxine (SYNTHROID) 137 MCG tablet Take 137 mcg by mouth daily before breakfast.   lisinopril  (PRINIVIL,ZESTRIL) 10 MG tablet Take 10 mg by mouth at bedtime.    methocarbamol (ROBAXIN) 500 MG tablet Take 1 tablet (500 mg total) by mouth every 6 (six) hours as needed for muscle spasms.   nitroGLYCERIN (NITROSTAT) 0.4 MG SL tablet Place 0.4 mg under the tongue every 5 (five) minutes as needed for chest pain.   omeprazole (PRILOSEC) 20 MG capsule Take 20 mg by mouth daily.   oxycodone (ROXICODONE) 30 MG immediate release tablet Take 30 mg by mouth 3 (three) times daily as needed for pain.   QUEtiapine (SEROQUEL) 100 MG tablet Take 300 mg by mouth at bedtime.     Allergies:   Bee venom and Cyclobenzaprine   Social History   Socioeconomic History   Marital status: Divorced    Spouse name: Not on file   Number of children: 1   Years of education: Not on file   Highest education level: Some college, no degree  Occupational History   Not on file  Tobacco Use   Smoking status: Former    Packs/day: 0.25    Years: 35.00    Total pack years: 8.75    Types: Cigarettes    Quit date: 05/05/2021    Years since quitting: 1.2   Smokeless tobacco: Never  Vaping Use   Vaping Use: Never used  Substance and Sexual Activity   Alcohol use: No   Drug use: No   Sexual activity: Never  Other Topics Concern   Not on file  Social History Narrative   Lives at home with his parents    Right handed   Caffeine: about 36 oz of soda daily   Social Determinants of Health   Financial Resource Strain: Not on file  Food Insecurity: Not on file  Transportation Needs: Not on file  Physical Activity: Not on file  Stress: Not on file  Social Connections: Not on file     Family History: The patient's family history includes Cancer in an other family member; Hypertension in his father, mother, and another family member; Valvular heart disease in his father.  ROS:   Please see the history of present illness.    Neck and shoulder are doing very well.  All other systems reviewed and are  negative.  EKGs/Labs/Other Studies Reviewed:    The following studies were reviewed today: Urinary CTA 02/2018   IMPRESSION: 1. Coronary artery calcium score 463 Agatston units. This places the patient in the 95th percentile for age and gender, suggesting high risk for future cardiac events.   2. Possible moderate distal RCA stenosis, moderate mid LAD stenosis, moderate D1 stenosis, and moderate OM1 stenosis. 3.  FFR was technically impossible to complete.  Coronary angiography 2019: Diagnostic Dominance: Right   EKG:  EKG sinus rhythm, left ventricular hypertrophy voltage.  Otherwise normal EKG.  When compared to  May 19, 2021, no significant changes noted  Recent Labs: 03/27/2022: BUN 19; Creatinine, Ser 0.80; Hemoglobin 12.8; Platelets 233; Potassium 4.2; Sodium 140  Recent Lipid Panel    Component Value Date/Time   CHOL 134 04/10/2018 0907   TRIG 111 04/10/2018 0907   HDL 53 04/10/2018 0907   CHOLHDL 2.5 04/10/2018 0907   CHOLHDL 7.1 10/04/2011 0545   VLDL 74 (H) 10/04/2011 0545   LDLCALC 59 04/10/2018 0907    Physical Exam:    VS:  BP 130/82   Pulse 62   Ht 5\' 9"  (1.753 m)   Wt 162 lb (73.5 kg)   SpO2 96%   BMI 23.92 kg/m     Wt Readings from Last 3 Encounters:  08/02/22 162 lb (73.5 kg)  04/07/22 161 lb (73 kg)  03/27/22 161 lb (73 kg)     GEN: Slender. No acute distress HEENT: Normal NECK: No JVD. LYMPHATICS: No lymphadenopathy CARDIAC: No murmur. RRR no gallop, or edema. VASCULAR:  Normal Pulses. No bruits. RESPIRATORY:  Clear to auscultation without rales, wheezing or rhonchi  ABDOMEN: Soft, non-tender, non-distended, No pulsatile mass, MUSCULOSKELETAL: No deformity  SKIN: Warm and dry NEUROLOGIC:  Alert and oriented x 3 PSYCHIATRIC:  Normal affect   ASSESSMENT:    1. CAD in native artery   2. Essential hypertension   3. Hyperlipidemia LDL goal <70   4. Bilateral carotid artery stenosis   5. Aortic valve sclerosis    PLAN:    In  order of problems listed above:  Continue aggressive risk factor modification including 150 minutes of moderate activity per week. Excellent blood pressure control on current management strategy including Normodyne, Cardura, and Zestril. Continue high intensity statin therapy with Lipitor 40 mg/day.  Most recent LDL cholesterol was 45. Secondary prevention May need an echo done at some point downstream.  Recommended Dr. 03/29/22 as his primary cardiologist.   Medication Adjustments/Labs and Tests Ordered: Current medicines are reviewed at length with the patient today.  Concerns regarding medicines are outlined above.  Orders Placed This Encounter  Procedures   EKG 12-Lead   No orders of the defined types were placed in this encounter.   Patient Instructions  Medication Instructions:  Your physician recommends that you continue on your current medications as directed. Please refer to the Current Medication list given to you today.  *If you need a refill on your cardiac medications before your next appointment, please call your pharmacy*  Follow-Up: At [facility], you and your health needs are our priority.  As part of our continuing mission to provide you with exceptional heart care, we have created designated Provider Care Teams.  These Care Teams include your primary Cardiologist (physician) and Advanced Practice Providers (APPs -  Physician Assistants and Nurse Practitioners) who all work together to provide you with the care you need, when you need it.  Your next appointment:   1 year(s)  The format for your next appointment:   In Person  Provider:   [facility], MD  Important Information About Sugar         Signed, Donato Schultz, MD  08/02/2022 10:11 AM    [facility]

## 2022-08-02 NOTE — Patient Instructions (Signed)
Medication Instructions:  Your physician recommends that you continue on your current medications as directed. Please refer to the Current Medication list given to you today.  *If you need a refill on your cardiac medications before your next appointment, please call your pharmacy*  Follow-Up: At [facility], you and your health needs are our priority.  As part of our continuing mission to provide you with exceptional heart care, we have created designated Provider Care Teams.  These Care Teams include your primary Cardiologist (physician) and Advanced Practice Providers (APPs -  Physician Assistants and Nurse Practitioners) who all work together to provide you with the care you need, when you need it.  Your next appointment:   1 year(s)  The format for your next appointment:   In Person  Provider:   Mark Skains, MD  Important Information About Sugar       

## 2022-09-11 ENCOUNTER — Ambulatory Visit: Payer: Medicare Other | Admitting: Physician Assistant

## 2022-10-02 DIAGNOSIS — I209 Angina pectoris, unspecified: Secondary | ICD-10-CM | POA: Diagnosis not present

## 2022-10-02 DIAGNOSIS — E781 Pure hyperglyceridemia: Secondary | ICD-10-CM | POA: Diagnosis not present

## 2022-10-02 DIAGNOSIS — I10 Essential (primary) hypertension: Secondary | ICD-10-CM | POA: Diagnosis not present

## 2022-10-02 DIAGNOSIS — E039 Hypothyroidism, unspecified: Secondary | ICD-10-CM | POA: Diagnosis not present

## 2022-10-03 DIAGNOSIS — E039 Hypothyroidism, unspecified: Secondary | ICD-10-CM | POA: Diagnosis not present

## 2022-10-26 DIAGNOSIS — G894 Chronic pain syndrome: Secondary | ICD-10-CM | POA: Diagnosis not present

## 2022-10-26 DIAGNOSIS — M5136 Other intervertebral disc degeneration, lumbar region: Secondary | ICD-10-CM | POA: Diagnosis not present

## 2022-12-04 DIAGNOSIS — M5416 Radiculopathy, lumbar region: Secondary | ICD-10-CM | POA: Diagnosis not present

## 2022-12-04 DIAGNOSIS — M5030 Other cervical disc degeneration, unspecified cervical region: Secondary | ICD-10-CM | POA: Diagnosis not present

## 2022-12-04 DIAGNOSIS — M5412 Radiculopathy, cervical region: Secondary | ICD-10-CM | POA: Diagnosis not present

## 2022-12-04 DIAGNOSIS — M5136 Other intervertebral disc degeneration, lumbar region: Secondary | ICD-10-CM | POA: Diagnosis not present

## 2023-02-06 ENCOUNTER — Other Ambulatory Visit: Payer: Self-pay | Admitting: Internal Medicine

## 2023-02-06 DIAGNOSIS — E781 Pure hyperglyceridemia: Secondary | ICD-10-CM | POA: Diagnosis not present

## 2023-02-06 DIAGNOSIS — Z0000 Encounter for general adult medical examination without abnormal findings: Secondary | ICD-10-CM | POA: Diagnosis not present

## 2023-02-06 DIAGNOSIS — I700 Atherosclerosis of aorta: Secondary | ICD-10-CM | POA: Diagnosis not present

## 2023-02-06 DIAGNOSIS — M4712 Other spondylosis with myelopathy, cervical region: Secondary | ICD-10-CM | POA: Diagnosis not present

## 2023-02-06 DIAGNOSIS — K760 Fatty (change of) liver, not elsewhere classified: Secondary | ICD-10-CM | POA: Diagnosis not present

## 2023-02-06 DIAGNOSIS — I10 Essential (primary) hypertension: Secondary | ICD-10-CM | POA: Diagnosis not present

## 2023-02-06 DIAGNOSIS — D649 Anemia, unspecified: Secondary | ICD-10-CM | POA: Diagnosis not present

## 2023-02-06 DIAGNOSIS — R739 Hyperglycemia, unspecified: Secondary | ICD-10-CM | POA: Diagnosis not present

## 2023-02-06 DIAGNOSIS — Z122 Encounter for screening for malignant neoplasm of respiratory organs: Secondary | ICD-10-CM

## 2023-02-06 DIAGNOSIS — Z79899 Other long term (current) drug therapy: Secondary | ICD-10-CM | POA: Diagnosis not present

## 2023-02-06 DIAGNOSIS — Z23 Encounter for immunization: Secondary | ICD-10-CM | POA: Diagnosis not present

## 2023-02-06 DIAGNOSIS — I6523 Occlusion and stenosis of bilateral carotid arteries: Secondary | ICD-10-CM | POA: Diagnosis not present

## 2023-02-06 DIAGNOSIS — E039 Hypothyroidism, unspecified: Secondary | ICD-10-CM | POA: Diagnosis not present

## 2023-02-06 DIAGNOSIS — J432 Centrilobular emphysema: Secondary | ICD-10-CM | POA: Diagnosis not present

## 2023-03-07 DIAGNOSIS — M542 Cervicalgia: Secondary | ICD-10-CM | POA: Diagnosis not present

## 2023-03-09 ENCOUNTER — Inpatient Hospital Stay: Admission: RE | Admit: 2023-03-09 | Payer: Medicare Other | Source: Ambulatory Visit

## 2023-03-20 DIAGNOSIS — M5481 Occipital neuralgia: Secondary | ICD-10-CM | POA: Diagnosis not present

## 2023-04-02 DIAGNOSIS — M4712 Other spondylosis with myelopathy, cervical region: Secondary | ICD-10-CM | POA: Diagnosis not present

## 2023-04-02 DIAGNOSIS — G894 Chronic pain syndrome: Secondary | ICD-10-CM | POA: Diagnosis not present

## 2023-04-02 DIAGNOSIS — M5416 Radiculopathy, lumbar region: Secondary | ICD-10-CM | POA: Diagnosis not present

## 2023-04-02 DIAGNOSIS — M5136 Other intervertebral disc degeneration, lumbar region: Secondary | ICD-10-CM | POA: Diagnosis not present

## 2023-04-02 DIAGNOSIS — M5412 Radiculopathy, cervical region: Secondary | ICD-10-CM | POA: Diagnosis not present

## 2023-04-02 DIAGNOSIS — M5481 Occipital neuralgia: Secondary | ICD-10-CM | POA: Diagnosis not present

## 2023-04-02 DIAGNOSIS — M5030 Other cervical disc degeneration, unspecified cervical region: Secondary | ICD-10-CM | POA: Diagnosis not present

## 2023-06-15 ENCOUNTER — Telehealth: Payer: Self-pay | Admitting: Cardiology

## 2023-06-15 ENCOUNTER — Other Ambulatory Visit: Payer: Self-pay | Admitting: *Deleted

## 2023-06-15 MED ORDER — AMLODIPINE BESYLATE 5 MG PO TABS: 5.0000 mg | ORAL_TABLET | Freq: Every day | ORAL | 0 refills | Status: DC

## 2023-06-15 NOTE — Telephone Encounter (Signed)
*  STAT* If patient is at the pharmacy, call can be transferred to refill team.   1. Which medications need to be refilled? (please list name of each medication and dose if known)   amLODipine (NORVASC) 5 MG tablet   2. Would you like to learn more about the convenience, safety, & potential cost savings by using the [facility]?   3. Are you open to using the [facility] (Type [facility]. ).  4. Which pharmacy/location (including street and city if local pharmacy) is medication to be sent to?  [id number] - [place] ([place]), [place] - [address]   5. Do they need a 30 day or 90 day supply?   90 day  Patient stated he is completely out of this medication.

## 2023-07-17 DIAGNOSIS — Z9181 History of falling: Secondary | ICD-10-CM | POA: Diagnosis not present

## 2023-07-17 DIAGNOSIS — I10 Essential (primary) hypertension: Secondary | ICD-10-CM | POA: Diagnosis not present

## 2023-07-17 DIAGNOSIS — R55 Syncope and collapse: Secondary | ICD-10-CM | POA: Diagnosis not present

## 2023-07-17 DIAGNOSIS — Z23 Encounter for immunization: Secondary | ICD-10-CM | POA: Diagnosis not present

## 2023-07-18 ENCOUNTER — Other Ambulatory Visit (HOSPITAL_COMMUNITY): Payer: Self-pay | Admitting: Internal Medicine

## 2023-07-18 DIAGNOSIS — R55 Syncope and collapse: Secondary | ICD-10-CM

## 2023-07-21 ENCOUNTER — Other Ambulatory Visit: Payer: Self-pay | Admitting: Cardiovascular Disease

## 2023-07-23 NOTE — Telephone Encounter (Signed)
Refill Request.  

## 2023-08-01 DIAGNOSIS — M5412 Radiculopathy, cervical region: Secondary | ICD-10-CM | POA: Diagnosis not present

## 2023-08-01 DIAGNOSIS — M51362 Other intervertebral disc degeneration, lumbar region with discogenic back pain and lower extremity pain: Secondary | ICD-10-CM | POA: Diagnosis not present

## 2023-08-01 DIAGNOSIS — G894 Chronic pain syndrome: Secondary | ICD-10-CM | POA: Diagnosis not present

## 2023-08-01 DIAGNOSIS — Z5181 Encounter for therapeutic drug level monitoring: Secondary | ICD-10-CM | POA: Diagnosis not present

## 2023-08-01 DIAGNOSIS — M5030 Other cervical disc degeneration, unspecified cervical region: Secondary | ICD-10-CM | POA: Diagnosis not present

## 2023-08-01 DIAGNOSIS — R296 Repeated falls: Secondary | ICD-10-CM | POA: Diagnosis not present

## 2023-08-01 DIAGNOSIS — M4712 Other spondylosis with myelopathy, cervical region: Secondary | ICD-10-CM | POA: Diagnosis not present

## 2023-08-01 DIAGNOSIS — Z79899 Other long term (current) drug therapy: Secondary | ICD-10-CM | POA: Diagnosis not present

## 2023-08-01 DIAGNOSIS — M5416 Radiculopathy, lumbar region: Secondary | ICD-10-CM | POA: Diagnosis not present

## 2023-08-01 DIAGNOSIS — M5481 Occipital neuralgia: Secondary | ICD-10-CM | POA: Diagnosis not present

## 2023-08-06 DIAGNOSIS — R55 Syncope and collapse: Secondary | ICD-10-CM | POA: Diagnosis not present

## 2023-08-10 ENCOUNTER — Other Ambulatory Visit: Payer: Self-pay | Admitting: General Practice

## 2023-08-15 ENCOUNTER — Ambulatory Visit (HOSPITAL_COMMUNITY): Payer: Medicare Other | Attending: Internal Medicine

## 2023-08-15 DIAGNOSIS — R55 Syncope and collapse: Secondary | ICD-10-CM | POA: Insufficient documentation

## 2023-08-15 LAB — ECHOCARDIOGRAM COMPLETE: S' Lateral: 3.3 cm

## 2023-08-22 ENCOUNTER — Other Ambulatory Visit: Payer: Self-pay | Admitting: Internal Medicine

## 2023-08-22 DIAGNOSIS — R42 Dizziness and giddiness: Secondary | ICD-10-CM

## 2023-08-22 DIAGNOSIS — I6523 Occlusion and stenosis of bilateral carotid arteries: Secondary | ICD-10-CM | POA: Diagnosis not present

## 2023-08-22 DIAGNOSIS — I6503 Occlusion and stenosis of bilateral vertebral arteries: Secondary | ICD-10-CM | POA: Diagnosis not present

## 2023-08-22 DIAGNOSIS — R55 Syncope and collapse: Secondary | ICD-10-CM | POA: Diagnosis not present

## 2023-09-06 ENCOUNTER — Ambulatory Visit: Payer: Medicare Other | Admitting: Cardiology

## 2023-09-10 ENCOUNTER — Other Ambulatory Visit: Payer: Medicare Other

## 2023-09-12 DIAGNOSIS — K2270 Barrett's esophagus without dysplasia: Secondary | ICD-10-CM | POA: Diagnosis not present

## 2023-09-17 ENCOUNTER — Other Ambulatory Visit: Payer: Self-pay | Admitting: General Practice

## 2023-09-19 ENCOUNTER — Ambulatory Visit: Payer: Medicare Other

## 2023-09-19 ENCOUNTER — Ambulatory Visit
Admission: RE | Admit: 2023-09-19 | Discharge: 2023-09-19 | Disposition: A | Discharge status: Home / Self Care | Payer: Medicare Other | Source: Ambulatory Visit | Attending: Internal Medicine | Admitting: Internal Medicine

## 2023-09-19 DIAGNOSIS — I6603 Occlusion and stenosis of bilateral middle cerebral arteries: Secondary | ICD-10-CM | POA: Diagnosis not present

## 2023-09-19 DIAGNOSIS — R55 Syncope and collapse: Secondary | ICD-10-CM

## 2023-09-19 DIAGNOSIS — I6523 Occlusion and stenosis of bilateral carotid arteries: Secondary | ICD-10-CM

## 2023-09-19 DIAGNOSIS — R42 Dizziness and giddiness: Secondary | ICD-10-CM | POA: Diagnosis not present

## 2023-09-19 DIAGNOSIS — I6503 Occlusion and stenosis of bilateral vertebral arteries: Secondary | ICD-10-CM

## 2023-09-19 MED ORDER — IOPAMIDOL (ISOVUE-370) INJECTION 76%
75.0000 mL | Freq: Once | INTRAVENOUS | Status: AC | PRN
  Administered 2023-09-19: 75 mL via INTRAVENOUS

## 2023-09-25 ENCOUNTER — Institutional Professional Consult (permissible substitution): Payer: Medicare Other | Admitting: Neurology

## 2023-09-26 ENCOUNTER — Ambulatory Visit: Payer: Medicare Other | Admitting: Cardiology

## 2023-09-26 ENCOUNTER — Other Ambulatory Visit: Payer: Self-pay | Admitting: Cardiology

## 2023-09-26 NOTE — Telephone Encounter (Signed)
*  STAT* If patient is at the pharmacy, call can be transferred to refill team.   1. Which medications need to be refilled? (please list name of each medication and dose if known) NITROGLYCERIN   2. Would you like to learn more about the convenience, safety, & potential cost savings by using the [facility]?    3. Are you open to using the [facility] ([facility].    4. Which pharmacy/location (including street and city if local pharmacy) is medication to be sent to?[facility]   5. Do they need a 30 day or 90 day supply? 30

## 2023-09-27 ENCOUNTER — Telehealth: Payer: Self-pay | Admitting: Cardiology

## 2023-09-27 ENCOUNTER — Telehealth: Payer: Self-pay | Admitting: General Practice

## 2023-09-27 MED ORDER — NITROGLYCERIN 0.4 MG SL SUBL: 0.4000 mg | SUBLINGUAL_TABLET | SUBLINGUAL | 4 refills | Status: DC | PRN

## 2023-09-27 NOTE — Telephone Encounter (Signed)
Refill sent into pharmacy of choice.

## 2023-09-27 NOTE — Telephone Encounter (Signed)
*  STAT* If patient is at the pharmacy, call can be transferred to refill team.   1. Which medications need to be refilled? (please list name of each medication and dose if known)   nitroGLYCERIN (NITROSTAT) 0.4 MG SL tablet    2. Which pharmacy/location (including street and city if local pharmacy) is medication to be sent to? [facility] [address] [place]), [place] - [id number] Phone: [phone]  Fax: [phone]     3. Do they need a 30 day or 90 day supply? 30

## 2023-09-27 NOTE — Telephone Encounter (Signed)
Patient wouild like to switch from Dr. Anne Fu to Dr. Excell Seltzer because that is who his mom see's. He states that Dr. Excell Seltzer has already agreed to this but sending to both providers for documentation.

## 2023-10-05 ENCOUNTER — Telehealth: Payer: Self-pay | Admitting: Neurology

## 2023-10-05 ENCOUNTER — Ambulatory Visit (INDEPENDENT_AMBULATORY_CARE_PROVIDER_SITE_OTHER): Payer: Medicare Other | Admitting: Neurology

## 2023-10-05 DIAGNOSIS — G450 Vertebro-basilar artery syndrome: Secondary | ICD-10-CM

## 2023-10-05 NOTE — Telephone Encounter (Signed)
Patient was late for his appointment today. Per our office policy and to the degree he was late, we rescheduled him. He was upset but we apologized and explained why this office policy exists.  Patient left and he came back into the office and said that if anything happens to him this week because of what he has going on that he was going to sue [place]   I will not see this patient, this is inappropriate, it's threatening and we were following our guideline per office policy. He was supposed to be here at 745 for an 815 appointment and was 35 minutes late.  I would like either a letter or staff to call him, this is not acceptable.   I called Dr. Orson Aloe at [facility] and tried to get through but went to a voice mail  The doctor-patient relationship has been compromised, I am not comfortable seeing this patient.

## 2023-10-05 NOTE — Progress Notes (Unsigned)
PATIENT CAME LATE TO APPOINTMENT WAS UNABLE TO BE SEEN HAD TO RESCHEDULE, PRECHARTING AS BELOW     [facility]    Provider:  Dr Lucia Gaskins Referring Provider: Emilio Aspen, * Primary Care Physician:  Emilio Aspen, MD  CC:  Vertebral artery occlusion and syncope  10/05/2023:    PATIENT CAME LATE TO APPOINTMENT WAS UNABLE TO BE SEEN HAD TO RESCHEDULE, PRECHARTING AS BELOW    Here for cerebrovacular disease. We saw patient in the past for same and CTA showed chronic multivessel stenosis, he had a lot of risk factors including smoking and medical management was recommended. Here agin for the same. has Chest pain; Bipolar 1 disorder (HCC); Graves' disease; HTN (hypertension); Mixed hyperlipidemia; Tobacco abuse; Bilateral carotid bruits; Coronary artery calcification seen on CAT scan; Stenosis of right subclavian artery (HCC); CAD (coronary artery disease); Syncope; Vertebrobasilar insufficiency; Cervical myelopathy (HCC); and H/O total shoulder replacement, left on their problem list.   CTA H&N 09/19/2023: 1. No evidence of an acute intracranial abnormality. 2. Small mucous retention cyst within the left sphenoid sinus.   CTA neck:   1. The common carotid and internal carotid arteries are patent within the neck without stenosis. Mild atherosclerotic plaque bilaterally, as described. 2. The vertebral arteries are patent within the neck. Atherosclerotic plaque within the right V1 segment with mild stenosis. Cervical spondylosis and post-operative changes. 3. Prior thyroidectomy. 4. Aortic Atherosclerosis (ICD10-I70.0) and Emphysema (ICD10-J43.9).   CTA head:   1. Intracranial atherosclerotic disease with multifocal stenoses, most notably as follows. 2. Moderate stenosis of the left internal carotid artery at the petrous-cavernous junction, progressed from the prior CTA of 06/03/2021. 3. Severe stenosis within a right middle cerebral artery  distal M2/proximal M3 branch, new from the prior CTA. 4. Moderate stenosis of the left vertebral artery V4 segment, unchanged.     Electronically Signed   By: Jackey Loge D.O.   On: 09/19/2023 19:39  HPI:  [name] is a 63 y.o. male here as a referral from Dr. Orson Aloe for vertebral artery occlusion. PMHx tobacco abuse, HLD, HTN, bipolar. He has had syncope for years, he has had a "bad neck" and multiple accidents affecting his neck. He has episodes of syncope, sometimes he can "catch himself, if he is extending his neck he can pass out. He feels pins and needles in is head, whole head feels loopy, dizzy, lightheaded, just goes black. If he quits looking up he can stop it. No palpitations, no vertigo, but very lightheaded, +vison changes. If he moves his neck he can avoid it. Loss of consciousness is brief. No confusion afterwards.  He smoked for 35 years. Takes 81 mg lipitor every day and statin. He has stopped smoking. No other focal neurologic deficits, associated symptoms, inciting events or modifiable factors.  Reviewed notes, labs and imaging from outside physicians, which showed:   LDL 95  Personally reviewed images and agree with the following:  1. Positive for moderate to severe stenosis of both distal vertebral arteries in the posterior fossa related to soft and calcified plaque. The vertebrobasilar junction remains patent. There is mild to moderate plaque and stenosis in the proximal basilar artery. 2. No significant subclavian or cervical carotid artery stenosis despite intermittent soft and calcified plaque. Note that there is at least moderate calcified atherosclerosis of both ICA siphons at the skull base. 3.  Aortic Atherosclerosis (ICD10-I70.0). 4. Evidence of degenerative cervical spinal stenosis at C3-C4.   Reviewed report VAS US carotids  Right Carotid: Velocities in the right ICA are consistent with a 1-39% stenosis.                The RICA velocities  remain within normal range and stable                compared to the prior exam.  Left Carotid: Velocities in the left ICA are consistent with a 1-39% stenosis.               The LICA velocities remain within normal range and stable compared               to the prior exam.  Vertebrals:  Bilateral vertebral arteries demonstrate antegrade flow. Subclavians: Right subclavian artery flow was disturbed. Normal flow              hemodynamics were seen in the left subclavian artery.     Assessment/Plan:     PATIENT CAME LATE TO APPOINTMENT WAS UNABLE TO BE SEEN HAD TO RESCHEDULE, PRECHARTING AS BELOW    Here for cerebrovacular disease. We saw patient in the past for same and CTA showed chronic multivessel stenosis, he had a lot of risk factors including smoking and medical management was recommended. Here agin for the same. has Chest pain; Bipolar 1 disorder (HCC); Graves' disease; HTN (hypertension); Mixed hyperlipidemia; Tobacco abuse; Bilateral carotid bruits; Coronary artery calcification seen on CAT scan; Stenosis of right subclavian artery (HCC); CAD (coronary artery disease); Syncope; Vertebrobasilar insufficiency; Cervical myelopathy (HCC); and H/O total shoulder replacement, left on their problem list.  The latest CTA neck in 2015 shows that the vertebral arteries are patent/ mild stenosis and CTA head showed Moderate stenosis of the left vertebral artery V4 segment, unchanged ; and so would be unlikely vertebrobasilar stenosis as a reason for his syncope with head extension.  CTA shows advancement of atherosclerosis in particular intracranial atherosclerotic disease with multifocal stenoses: Moderate stenosis of the left internal artery at the petrous cavernous junction progressed from CTA in 2022, and severe stenosis within the right middle cerebral artery distal M2/proximal M3 branch new from prior CTA.  At this point medical management is likely again the key but may consider cerebral  angiogram and in particular to examine evere stenosis within the right middle cerebral artery distal M2/proximal M3 branch new from prior CTA.    CTA neck:   1. The common carotid and internal carotid arteries are patent within the neck without stenosis. Mild atherosclerotic plaque bilaterally, as described. 2. The vertebral arteries are patent within the neck. Atherosclerotic plaque within the right V1 segment with mild stenosis. Cervical spondylosis and post-operative changes. 3. Prior thyroidectomy. 4. Aortic Atherosclerosis (ICD10-I70.0) and Emphysema (ICD10-J43.9).   CTA head:   1. Intracranial atherosclerotic disease with multifocal stenoses, most notably as follows. 2. Moderate stenosis of the left internal carotid artery at the petrous-cavernous junction, progressed from the prior CTA of 06/03/2021. 3. Severe stenosis within a right middle cerebral artery distal M2/proximal M3 branch, new from the prior CTA. 4. Moderate stenosis of the left vertebral artery V4 segment, unchanged.     Electronically Signed   By: Jackey Loge D.O.   On: 09/19/2023 19:39  - Very nice gentleman here for syncopal episodes on neck extension likely due to vertebrobasilar insufficiency need to check for strokes and other etiologies such as schwannoma or masses given positional syncope - Needs control of vascular risk factors, he has done well and stopped smoking and started HLD management. Continie  asa - will check hgbaic and bmp for labs - ?to Dr. Corliss Skains  No orders of the defined types were placed in this encounter.       Naomie Dean, MD  [facility] [address] [place], [place] [address]  Phone [phone] Fax [phone]

## 2023-10-07 ENCOUNTER — Other Ambulatory Visit: Payer: Self-pay | Admitting: General Practice

## 2023-10-08 ENCOUNTER — Encounter: Payer: Self-pay | Admitting: Neurology

## 2023-10-08 NOTE — Telephone Encounter (Signed)
Patient also used verbally abusive language at our front test and swore using a word starting with "F" thank you

## 2023-10-12 DIAGNOSIS — Z7902 Long term (current) use of antithrombotics/antiplatelets: Secondary | ICD-10-CM | POA: Diagnosis not present

## 2023-10-12 DIAGNOSIS — I6522 Occlusion and stenosis of left carotid artery: Secondary | ICD-10-CM | POA: Diagnosis not present

## 2023-10-12 DIAGNOSIS — I669 Occlusion and stenosis of unspecified cerebral artery: Secondary | ICD-10-CM | POA: Diagnosis not present

## 2023-10-12 DIAGNOSIS — I6502 Occlusion and stenosis of left vertebral artery: Secondary | ICD-10-CM | POA: Diagnosis not present

## 2023-10-12 DIAGNOSIS — R42 Dizziness and giddiness: Secondary | ICD-10-CM | POA: Diagnosis not present

## 2023-10-12 DIAGNOSIS — R55 Syncope and collapse: Secondary | ICD-10-CM | POA: Diagnosis not present

## 2023-11-13 ENCOUNTER — Ambulatory Visit: Payer: Medicare Other | Attending: Cardiology | Admitting: Cardiology

## 2023-11-13 VITALS — BP 148/84 | HR 61 | Ht 68.0 in | Wt 154.0 lb

## 2023-11-13 DIAGNOSIS — R931 Abnormal findings on diagnostic imaging of heart and coronary circulation: Secondary | ICD-10-CM | POA: Diagnosis not present

## 2023-11-13 DIAGNOSIS — I2510 Atherosclerotic heart disease of native coronary artery without angina pectoris: Secondary | ICD-10-CM

## 2023-11-13 DIAGNOSIS — E785 Hyperlipidemia, unspecified: Secondary | ICD-10-CM

## 2023-11-13 DIAGNOSIS — I6523 Occlusion and stenosis of bilateral carotid arteries: Secondary | ICD-10-CM

## 2023-11-13 DIAGNOSIS — I10 Essential (primary) hypertension: Secondary | ICD-10-CM | POA: Diagnosis not present

## 2023-11-13 DIAGNOSIS — R55 Syncope and collapse: Secondary | ICD-10-CM

## 2023-11-13 DIAGNOSIS — E782 Mixed hyperlipidemia: Secondary | ICD-10-CM

## 2023-11-13 NOTE — Progress Notes (Unsigned)
 Cardiology Office Note:  .   Date:  11/13/2023  ID:  [name], DOB [birth year], MRN [id number] PCP:  Emilio Aspen, MD  Former Cardiology Providers: Dr. [facility] Providers Cardiologist:  Tessa Lerner, DO , [facility] (established care 11/13/23) Electrophysiologist:  None  Click to update primary MD,subspecialty MD or APP then REFRESH:1}    No chief complaint on file.   History of Present Illness: Marland Kitchen   [name] is a 63 y.o. Caucasian male whose past medical history and cardiovascular risk factors includes: moderate nonobstructive CAD by cath 2019, hyperlipidemia, hypertension, Aortic atherosclerosis, former tobacco abuse, bipolar disorder, and Barrett's esophagus.   Formally under the care of Dr. Katrinka Blazing who last saw [name] back in November 2023. I am seeing him for the first time to re-establishing care.   Patient is being followed by the practice given his history of moderate nonobstructive CAD as per heart catheterization from 2019.  He was last seen by Dr. Katrinka Blazing back in 2023 and is here to reestablish care with myself as mentioned above.   *** Syncope  6 months ago  No recurrance  ?myleopathy per pateint Look up and side gets light dizzzy  Unwitnessed  LOC not to long  No loss of B and B  No prodormal symptoms   Review of Systems: .   Review of Systems  Cardiovascular:  Positive for syncope (see HPI). Negative for chest pain, claudication, irregular heartbeat, leg swelling, near-syncope, orthopnea, palpitations and paroxysmal nocturnal dyspnea.  Respiratory:  Negative for shortness of breath.   Hematologic/Lymphatic: Negative for bleeding problem.    Studies Reviewed:   EKG: EKG Interpretation Date/Time:  Tuesday November 13 2023 08:34:09 EDT Ventricular Rate:  61 PR Interval:  174 QRS Duration:  102 QT Interval:  410 QTC Calculation: 412 R Axis:   76  Text Interpretation: Normal sinus rhythm Normal ECG When compared with ECG of  12-Mar-2018 06:21, No significant change was found Confirmed by Tessa Lerner [phone]) on 11/13/2023 8:56:52 AM  Echocardiogram: 08/15/2023  1. Left ventricular ejection fraction, by estimation, is 55 to 60%. The  left ventricle has normal function. The left ventricle has no regional  wall motion abnormalities. Left ventricular diastolic parameters are  consistent with Grade I diastolic  dysfunction (impaired relaxation).   2. Right ventricular systolic function is normal. The right ventricular  size is normal. There is normal pulmonary artery systolic pressure. The  estimated right ventricular systolic pressure is 20.8 mmHg.   3. The mitral valve is normal in structure. Trivial mitral valve  regurgitation. No evidence of mitral stenosis.   4. The aortic valve is tricuspid. There is mild calcification of the  aortic valve. Aortic valve regurgitation is trivial. No aortic stenosis is  present.   5. The inferior vena cava is normal in size with greater than 50%  respiratory variability, suggesting right atrial pressure of 3 mmHg.   Heart catheterization: 03/2018 Three-vessel mild to moderate nonobstructive coronary disease with 50 to 60% distal circumflex, 50 to 60% proximal first diagonal, 20 to 30% proximal to distal RCA, and luminal irregularities in the proximal to distal LAD. Normal left ventricular size and function.  Estimated ejection fraction 65%. Acticin coronary calcium score greater than 400, documented by previous coronary CTA   RECOMMENDATIONS:   Aggressive risk factor modification: LDL less than 70 and perhaps pushing to 50, < blood pressure 130/80 mmHg, monitor hemoglobin A1c and maintain less than 7, aspirin 81 mg/day, and  150 minutes of moderate aerobic activity per week  Carotid Duplex 05/2021 Right Carotid: Velocities in the right ICA are consistent with a 1-39% stenosis.   Left Carotid: Velocities in the left ICA are consistent with a 1-39% stenosis.   Vertebrals:  Bilateral vertebral arteries demonstrate antegrade flow.  Subclavians: Normal flow hemodynamics were seen in bilateral subclavian arteries.   RADIOLOGY: CTA head and neck with and without contrast: January 2025 Non-contrast head CT:   1. No evidence of an acute intracranial abnormality. 2. Small mucous retention cyst within the left sphenoid sinus.   CTA neck:   1. The common carotid and internal carotid arteries are patent within the neck without stenosis. Mild atherosclerotic plaque bilaterally, as described.  2. The vertebral arteries are patent within the neck. Atherosclerotic plaque within the right V1 segment with mild stenosis. Cervical spondylosis and post-operative changes. 3. Prior thyroidectomy. 4. Aortic Atherosclerosis (ICD10-I70.0) and Emphysema (ICD10-J43.9).   CTA head:   1. Intracranial atherosclerotic disease with multifocal stenoses, most notably as follows. 2. Moderate stenosis of the left internal carotid artery at the petrous-cavernous junction, progressed from the prior CTA of 06/03/2021. 3. Severe stenosis within a right middle cerebral artery distal M2/proximal M3 branch, new from the prior CTA. 4. Moderate stenosis of the left vertebral artery V4 segment,unchanged.  Risk Assessment/Calculations:   NA  Labs:       Latest Ref Rng & Units 03/27/2022    9:01 AM 07/04/2021    9:51 AM 05/02/2018    8:39 AM  CBC  WBC 4.0 - 10.5 K/uL 9.6  7.4  11.0   Hemoglobin 13.0 - 17.0 g/dL 16.1  09.6  04.5   Hematocrit 39.0 - 52.0 % 38.6  38.2  40.6   Platelets 150 - 400 K/uL 233  251  237        Latest Ref Rng & Units 03/27/2022    9:01 AM 07/04/2021    9:51 AM 06/03/2021    5:06 PM  BMP  Glucose 70 - 99 mg/dL 409  811    BUN 6 - 20 mg/dL 19  20    Creatinine 9.14 - 1.24 mg/dL 7.82  9.56  2.13   BUN/Creat Ratio 9 - 20  22    Sodium 135 - 145 mmol/L 140  142    Potassium 3.5 - 5.1 mmol/L 4.2  4.2    Chloride 98 - 111 mmol/L 107  102    CO2 22 - 32 mmol/L 27   24    Calcium 8.9 - 10.3 mg/dL 9.0  9.3        Latest Ref Rng & Units 03/27/2022    9:01 AM 07/04/2021    9:51 AM 06/03/2021    5:06 PM  CMP  Glucose 70 - 99 mg/dL 086  578    BUN 6 - 20 mg/dL 19  20    Creatinine 4.69 - 1.24 mg/dL 6.29  5.28  4.13   Sodium 135 - 145 mmol/L 140  142    Potassium 3.5 - 5.1 mmol/L 4.2  4.2    Chloride 98 - 111 mmol/L 107  102    CO2 22 - 32 mmol/L 27  24    Calcium 8.9 - 10.3 mg/dL 9.0  9.3      Lab Results  Component Value Date   CHOL 134 04/10/2018   HDL 53 04/10/2018   LDLCALC 59 04/10/2018   TRIG 111 04/10/2018   CHOLHDL 2.5 04/10/2018   No results for  input(s): "LIPOA" in the last 8760 hours. No components found for: "NTPROBNP" No results for input(s): "PROBNP" in the last 8760 hours. No results for input(s): "TSH" in the last 8760 hours.  External Labs: Collected: 6//2024 [facility] database. Total cholesterol 129, triglycerides 66, HDL 71, LDL 44. TSH 4.09  Physical Exam:    Today's Vitals   11/13/23 0838  BP: (!) 148/84  Pulse: 61  SpO2: 97%  Weight: 154 lb (69.9 kg)  Height: 5\' 8"  (1.727 m)   Body mass index is 23.42 kg/m. Wt Readings from Last 3 Encounters:  11/13/23 154 lb (69.9 kg)  08/02/22 162 lb (73.5 kg)  04/07/22 161 lb (73 kg)    Physical Exam  Constitutional: No distress.  hemodynamically stable  Neck: No JVD present.  Cardiovascular: Normal rate, regular rhythm, S1 normal and S2 normal. Exam reveals no gallop, no S3 and no S4.  No murmur heard. Pulmonary/Chest: Effort normal and breath sounds normal. No stridor. He has no wheezes. He has no rales.  Musculoskeletal:        General: No edema.     Cervical back: Neck supple.  Skin: Skin is warm.     Impression & Recommendation(s):  Impression:   ICD-10-CM   1. Nonobstructive atherosclerosis of coronary artery  I25.10 EKG 12-Lead    2. Agatston CAC score, >400  R93.1     3. Syncope, unspecified syncope type  R55     4. Essential hypertension  I10      5. Hyperlipidemia LDL goal <70  E78.5     6. Bilateral carotid artery stenosis  I65.23        Recommendation(s):  ***  Orders Placed:  Orders Placed This Encounter  Procedures   EKG 12-Lead    As part of medical decision making ***  Final Medication List:   No orders of the defined types were placed in this encounter.   Medications Discontinued During This Encounter  Medication Reason   oxycodone (ROXICODONE) 30 MG immediate release tablet    atorvastatin (LIPITOR) 40 MG tablet      Current Outpatient Medications:    atorvastatin (LIPITOR) 80 MG tablet, Take 1 tablet by mouth at bedtime., Disp: , Rfl:    clopidogrel (PLAVIX) 75 MG tablet, Take 1 tablet by mouth daily., Disp: , Rfl:    naloxone (NARCAN) nasal spray 4 mg/0.1 mL, Take by nasal route every 3 minutes until patient awakes or EMS arrives., Disp: , Rfl:    oxyCODONE (ROXICODONE) 15 MG immediate release tablet, 2 tablets by mouth in the morning as needed; 2 tablets at 1200 as needed, and 1 tablet by mouth at bedtime; as needed for pain., Disp: , Rfl:    temazepam (RESTORIL) 15 MG capsule, Take by mouth., Disp: , Rfl:    amLODipine (NORVASC) 5 MG tablet, Take 1 tablet by mouth once daily, Disp: 90 tablet, Rfl: 0   aspirin EC 81 MG tablet, Take 81 mg by mouth daily. Swallow whole., Disp: , Rfl:    buPROPion (WELLBUTRIN XL) 150 MG 24 hr tablet, Take 450 mg by mouth daily., Disp: , Rfl:    Cholecalciferol (VITAMIN D) 50 MCG (2000 UT) tablet, Take 2,000 Units by mouth daily., Disp: , Rfl:    Cyanocobalamin (B-12 PO), Take 1 capsule by mouth daily., Disp: , Rfl:    diclofenac Sodium (VOLTAREN) 1 % GEL, Apply 1 Application topically 4 (four) times daily as needed (pain)., Disp: , Rfl:    doxazosin (CARDURA) 4 MG tablet,  Take 4 mg by mouth 2 (two) times daily., Disp: , Rfl:    EPINEPHrine (EPIPEN 2-PAK) 0.3 mg/0.3 mL IJ SOAJ injection, Inject 0.3 mg into the muscle as needed for anaphylaxis., Disp: , Rfl:    labetalol  (NORMODYNE) 300 MG tablet, Take 300 mg by mouth 2 (two) times daily., Disp: , Rfl:    levothyroxine (SYNTHROID) 137 MCG tablet, Take 137 mcg by mouth daily before breakfast., Disp: , Rfl:    lisinopril (PRINIVIL,ZESTRIL) 10 MG tablet, Take 10 mg by mouth at bedtime. , Disp: , Rfl:    methocarbamol (ROBAXIN) 500 MG tablet, Take 1 tablet (500 mg total) by mouth every 6 (six) hours as needed for muscle spasms., Disp: 40 tablet, Rfl: 1   nitroGLYCERIN (NITROSTAT) 0.4 MG SL tablet, Place 1 tablet (0.4 mg total) under the tongue every 5 (five) minutes as needed for chest pain., Disp: 25 tablet, Rfl: 4   omeprazole (PRILOSEC) 20 MG capsule, Take 20 mg by mouth daily., Disp: , Rfl:    QUEtiapine (SEROQUEL) 100 MG tablet, Take 300 mg by mouth at bedtime., Disp: , Rfl:   Consent:   ***  Disposition:   *** Patient may be asked to follow-up sooner based on the results of the above-mentioned testing.  His questions and concerns were addressed to his satisfaction. He voices understanding of the recommendations provided during this encounter.    Signed, Tessa Lerner, DO, [facility]  [facility]  [address] [place], [place] [address] 11/13/2023 9:05 AM

## 2023-11-13 NOTE — Patient Instructions (Signed)
 Medication Instructions:  Your physician recommends that you continue on your current medications as directed. Please refer to the Current Medication list given to you today.  *If you need a refill on your cardiac medications before your next appointment, please call your pharmacy*  Lab Work: None ordered today. If you have labs (blood work) drawn today and your tests are completely normal, you will receive your results only by: MyChart Message (if you have MyChart) OR A paper copy in the mail If you have any lab test that is abnormal or we need to change your treatment, we will call you to review the results.  Testing/Procedures: None ordered today.  Follow-Up: At [facility], you and your health needs are our priority.  As part of our continuing mission to provide you with exceptional heart care, we have created designated Provider Care Teams.  These Care Teams include your primary Cardiologist (physician) and Advanced Practice Providers (APPs -  Physician Assistants and Nurse Practitioners) who all work together to provide you with the care you need, when you need it.  We recommend signing up for the patient portal called "MyChart".  Sign up information is provided on this After Visit Summary.  MyChart is used to connect with patients for Virtual Visits (Telemedicine).  Patients are able to view lab/test results, encounter notes, upcoming appointments, etc.  Non-urgent messages can be sent to your provider as well.   To learn more about what you can do with MyChart, go to [email or web address].    Your next appointment:   1 year(s)  The format for your next appointment:   In Person  Provider:   Tessa Lerner, DO {

## 2023-11-15 ENCOUNTER — Encounter: Payer: Self-pay | Admitting: Cardiology

## 2023-11-19 ENCOUNTER — Telehealth: Payer: Self-pay

## 2023-11-19 NOTE — Telephone Encounter (Signed)
 Spoke with [name] in medical records at [facility]. Explained that pt has a monitor completed at their office previously and we need results faxed to [place] for Dr. Odis Hollingshead to review. [name] stated she will send over the results at the attention of Dr. Odis Hollingshead shortly.

## 2023-11-19 NOTE — Telephone Encounter (Signed)
-----   Message from [facility] sent at 11/15/2023  3:25 PM EDT ----- Regarding: Records Please see if he can get the cardiac monitor results from PCP   ST

## 2023-11-29 DIAGNOSIS — M5412 Radiculopathy, cervical region: Secondary | ICD-10-CM | POA: Diagnosis not present

## 2023-11-29 DIAGNOSIS — M5481 Occipital neuralgia: Secondary | ICD-10-CM | POA: Diagnosis not present

## 2023-11-29 DIAGNOSIS — G894 Chronic pain syndrome: Secondary | ICD-10-CM | POA: Diagnosis not present

## 2023-11-29 DIAGNOSIS — M4712 Other spondylosis with myelopathy, cervical region: Secondary | ICD-10-CM | POA: Diagnosis not present

## 2023-11-29 DIAGNOSIS — R296 Repeated falls: Secondary | ICD-10-CM | POA: Diagnosis not present

## 2023-11-29 DIAGNOSIS — Z79899 Other long term (current) drug therapy: Secondary | ICD-10-CM | POA: Diagnosis not present

## 2023-11-29 DIAGNOSIS — M5416 Radiculopathy, lumbar region: Secondary | ICD-10-CM | POA: Diagnosis not present

## 2023-11-29 DIAGNOSIS — M51362 Other intervertebral disc degeneration, lumbar region with discogenic back pain and lower extremity pain: Secondary | ICD-10-CM | POA: Diagnosis not present

## 2023-11-29 DIAGNOSIS — M5030 Other cervical disc degeneration, unspecified cervical region: Secondary | ICD-10-CM | POA: Diagnosis not present

## 2023-12-06 DIAGNOSIS — M5416 Radiculopathy, lumbar region: Secondary | ICD-10-CM | POA: Diagnosis not present

## 2024-01-05 ENCOUNTER — Other Ambulatory Visit: Payer: Self-pay | Admitting: General Practice

## 2024-01-09 DIAGNOSIS — I669 Occlusion and stenosis of unspecified cerebral artery: Secondary | ICD-10-CM | POA: Diagnosis not present

## 2024-01-21 ENCOUNTER — Emergency Department (HOSPITAL_COMMUNITY)

## 2024-01-21 ENCOUNTER — Encounter (HOSPITAL_COMMUNITY): Payer: Self-pay | Admitting: Internal Medicine

## 2024-01-21 ENCOUNTER — Observation Stay (HOSPITAL_COMMUNITY)
Admission: EM | Admit: 2024-01-21 | Discharge: 2024-01-22 | Disposition: A | Discharge status: Home / Self Care | Attending: Internal Medicine | Admitting: Internal Medicine

## 2024-01-21 ENCOUNTER — Other Ambulatory Visit: Payer: Self-pay

## 2024-01-21 ENCOUNTER — Observation Stay (HOSPITAL_COMMUNITY)

## 2024-01-21 DIAGNOSIS — R55 Syncope and collapse: Principal | ICD-10-CM | POA: Diagnosis present

## 2024-01-21 DIAGNOSIS — Z87891 Personal history of nicotine dependence: Secondary | ICD-10-CM | POA: Diagnosis not present

## 2024-01-21 DIAGNOSIS — Z7982 Long term (current) use of aspirin: Secondary | ICD-10-CM | POA: Insufficient documentation

## 2024-01-21 DIAGNOSIS — I252 Old myocardial infarction: Secondary | ICD-10-CM | POA: Diagnosis not present

## 2024-01-21 DIAGNOSIS — Z9181 History of falling: Secondary | ICD-10-CM | POA: Diagnosis not present

## 2024-01-21 DIAGNOSIS — S20222A Contusion of left back wall of thorax, initial encounter: Secondary | ICD-10-CM | POA: Diagnosis not present

## 2024-01-21 DIAGNOSIS — T148XXA Other injury of unspecified body region, initial encounter: Secondary | ICD-10-CM | POA: Diagnosis present

## 2024-01-21 DIAGNOSIS — Z79899 Other long term (current) drug therapy: Secondary | ICD-10-CM | POA: Diagnosis not present

## 2024-01-21 DIAGNOSIS — Z7902 Long term (current) use of antithrombotics/antiplatelets: Secondary | ICD-10-CM | POA: Diagnosis not present

## 2024-01-21 DIAGNOSIS — I2510 Atherosclerotic heart disease of native coronary artery without angina pectoris: Secondary | ICD-10-CM | POA: Diagnosis not present

## 2024-01-21 DIAGNOSIS — D649 Anemia, unspecified: Secondary | ICD-10-CM | POA: Insufficient documentation

## 2024-01-21 DIAGNOSIS — I6502 Occlusion and stenosis of left vertebral artery: Secondary | ICD-10-CM | POA: Insufficient documentation

## 2024-01-21 DIAGNOSIS — S0990XA Unspecified injury of head, initial encounter: Secondary | ICD-10-CM | POA: Diagnosis not present

## 2024-01-21 DIAGNOSIS — S300XXA Contusion of lower back and pelvis, initial encounter: Secondary | ICD-10-CM | POA: Diagnosis not present

## 2024-01-21 DIAGNOSIS — W19XXXA Unspecified fall, initial encounter: Secondary | ICD-10-CM | POA: Insufficient documentation

## 2024-01-21 DIAGNOSIS — R531 Weakness: Secondary | ICD-10-CM | POA: Diagnosis not present

## 2024-01-21 DIAGNOSIS — E785 Hyperlipidemia, unspecified: Secondary | ICD-10-CM | POA: Diagnosis not present

## 2024-01-21 DIAGNOSIS — R609 Edema, unspecified: Secondary | ICD-10-CM | POA: Diagnosis not present

## 2024-01-21 DIAGNOSIS — S29009A Unspecified injury of muscle and tendon of unspecified wall of thorax, initial encounter: Secondary | ICD-10-CM | POA: Diagnosis not present

## 2024-01-21 DIAGNOSIS — I10 Essential (primary) hypertension: Secondary | ICD-10-CM | POA: Diagnosis not present

## 2024-01-21 DIAGNOSIS — I700 Atherosclerosis of aorta: Secondary | ICD-10-CM | POA: Diagnosis not present

## 2024-01-21 DIAGNOSIS — I6522 Occlusion and stenosis of left carotid artery: Secondary | ICD-10-CM | POA: Insufficient documentation

## 2024-01-21 DIAGNOSIS — J439 Emphysema, unspecified: Secondary | ICD-10-CM | POA: Diagnosis not present

## 2024-01-21 DIAGNOSIS — E782 Mixed hyperlipidemia: Secondary | ICD-10-CM | POA: Insufficient documentation

## 2024-01-21 DIAGNOSIS — R42 Dizziness and giddiness: Secondary | ICD-10-CM | POA: Diagnosis not present

## 2024-01-21 DIAGNOSIS — D62 Acute posthemorrhagic anemia: Secondary | ICD-10-CM | POA: Diagnosis not present

## 2024-01-21 DIAGNOSIS — S2020XA Contusion of thorax, unspecified, initial encounter: Secondary | ICD-10-CM | POA: Diagnosis not present

## 2024-01-21 DIAGNOSIS — E890 Postprocedural hypothyroidism: Secondary | ICD-10-CM | POA: Diagnosis present

## 2024-01-21 DIAGNOSIS — E278 Other specified disorders of adrenal gland: Secondary | ICD-10-CM | POA: Diagnosis not present

## 2024-01-21 DIAGNOSIS — R222 Localized swelling, mass and lump, trunk: Secondary | ICD-10-CM | POA: Diagnosis not present

## 2024-01-21 DIAGNOSIS — I959 Hypotension, unspecified: Secondary | ICD-10-CM | POA: Diagnosis not present

## 2024-01-21 DIAGNOSIS — R112 Nausea with vomiting, unspecified: Secondary | ICD-10-CM | POA: Diagnosis not present

## 2024-01-21 LAB — CBC WITH DIFFERENTIAL/PLATELET
Abs Immature Granulocytes: 0.04 10*3/uL (ref 0.00–0.07)
Basophils Absolute: 0.1 10*3/uL (ref 0.0–0.1)
Basophils Relative: 1 %
Eosinophils Absolute: 0.1 10*3/uL (ref 0.0–0.5)
Eosinophils Relative: 1 %
HCT: 26.4 % — ABNORMAL LOW (ref 39.0–52.0)
Hemoglobin: 8.7 g/dL — ABNORMAL LOW (ref 13.0–17.0)
Immature Granulocytes: 0 %
Lymphocytes Relative: 9 %
Lymphs Abs: 0.9 10*3/uL (ref 0.7–4.0)
MCH: 30.5 pg (ref 26.0–34.0)
MCHC: 33 g/dL (ref 30.0–36.0)
MCV: 92.6 fL (ref 80.0–100.0)
Monocytes Absolute: 0.7 10*3/uL (ref 0.1–1.0)
Monocytes Relative: 6 %
Neutro Abs: 8.6 10*3/uL — ABNORMAL HIGH (ref 1.7–7.7)
Neutrophils Relative %: 83 %
Platelets: 174 10*3/uL (ref 150–400)
RBC: 2.85 MIL/uL — ABNORMAL LOW (ref 4.22–5.81)
RDW: 12.6 % (ref 11.5–15.5)
WBC: 10.3 10*3/uL (ref 4.0–10.5)
nRBC: 0 % (ref 0.0–0.2)

## 2024-01-21 LAB — BASIC METABOLIC PANEL WITH GFR
Anion gap: 8 (ref 5–15)
BUN: 18 mg/dL (ref 8–23)
CO2: 20 mmol/L — ABNORMAL LOW (ref 22–32)
Calcium: 7.3 mg/dL — ABNORMAL LOW (ref 8.9–10.3)
Chloride: 113 mmol/L — ABNORMAL HIGH (ref 98–111)
Creatinine, Ser: 0.78 mg/dL (ref 0.61–1.24)
GFR, Estimated: >60 mL/min
Glucose, Bld: 119 mg/dL — ABNORMAL HIGH (ref 70–99)
Potassium: 3.6 mmol/L (ref 3.5–5.1)
Sodium: 141 mmol/L (ref 135–145)

## 2024-01-21 LAB — TROPONIN I (HIGH SENSITIVITY)
Troponin I (High Sensitivity): 5 ng/L (ref <18)
Troponin I (High Sensitivity): 3 ng/L (ref <18)

## 2024-01-21 LAB — PREPARE RBC (CROSSMATCH)

## 2024-01-21 MED ORDER — SODIUM CHLORIDE 0.9% FLUSH
3.0000 mL | Freq: Two times a day (BID) | INTRAVENOUS | Status: DC
  Administered 2024-01-21: 3 mL via INTRAVENOUS

## 2024-01-21 MED ORDER — SODIUM CHLORIDE 0.9 % IV BOLUS
1000.0000 mL | Freq: Once | INTRAVENOUS | Status: AC
  Administered 2024-01-21: 1000 mL via INTRAVENOUS

## 2024-01-21 MED ORDER — DICLOFENAC SODIUM 1 % EX GEL
1.0000 | Freq: Four times a day (QID) | CUTANEOUS | Status: DC | PRN
  Filled 2024-01-21: qty 100

## 2024-01-21 MED ORDER — LEVOTHYROXINE SODIUM 25 MCG PO TABS
137.0000 ug | ORAL_TABLET | Freq: Every day | ORAL | Status: DC
  Administered 2024-01-22: 137 ug via ORAL

## 2024-01-21 MED ORDER — BUPROPION HCL ER (XL) 150 MG PO TB24
450.0000 mg | ORAL_TABLET | Freq: Every day | ORAL | Status: DC
  Administered 2024-01-22: 450 mg via ORAL

## 2024-01-21 MED ORDER — ALBUTEROL SULFATE (2.5 MG/3ML) 0.083% IN NEBU: 2.5000 mg | INHALATION_SOLUTION | Freq: Four times a day (QID) | RESPIRATORY_TRACT | Status: DC | PRN

## 2024-01-21 MED ORDER — TEMAZEPAM 7.5 MG PO CAPS
15.0000 mg | ORAL_CAPSULE | Freq: Every day | ORAL | Status: DC
  Administered 2024-01-21: 15 mg via ORAL

## 2024-01-21 MED ORDER — ACETAMINOPHEN 325 MG PO TABS
650.0000 mg | ORAL_TABLET | Freq: Four times a day (QID) | ORAL | Status: DC | PRN
  Administered 2024-01-21: 650 mg via ORAL

## 2024-01-21 MED ORDER — ATORVASTATIN CALCIUM 80 MG PO TABS
80.0000 mg | ORAL_TABLET | Freq: Every day | ORAL | Status: DC
  Administered 2024-01-21: 80 mg via ORAL

## 2024-01-21 MED ORDER — OXYCODONE HCL 5 MG PO TABS
30.0000 mg | ORAL_TABLET | Freq: Every day | ORAL | Status: DC | PRN
  Administered 2024-01-21 – 2024-01-22 (×2): 30 mg via ORAL

## 2024-01-21 MED ORDER — IOHEXOL 350 MG/ML SOLN
100.0000 mL | Freq: Once | INTRAVENOUS | Status: AC | PRN
  Administered 2024-01-21: 100 mL via INTRAVENOUS

## 2024-01-21 MED ORDER — ONDANSETRON HCL 4 MG PO TABS: 4.0000 mg | ORAL_TABLET | Freq: Four times a day (QID) | ORAL | Status: DC | PRN

## 2024-01-21 MED ORDER — ONDANSETRON HCL 4 MG/2ML IJ SOLN: 4.0000 mg | Freq: Four times a day (QID) | INTRAMUSCULAR | Status: DC | PRN

## 2024-01-21 MED ORDER — QUETIAPINE FUMARATE 100 MG PO TABS
300.0000 mg | ORAL_TABLET | Freq: Every day | ORAL | Status: DC
  Administered 2024-01-21: 300 mg via ORAL

## 2024-01-21 MED ORDER — DOXAZOSIN MESYLATE 4 MG PO TABS
4.0000 mg | ORAL_TABLET | Freq: Two times a day (BID) | ORAL | Status: DC
  Administered 2024-01-21 – 2024-01-22 (×2): 4 mg via ORAL
  Filled 2024-01-21 (×3): qty 1

## 2024-01-21 MED ORDER — ACETAMINOPHEN 650 MG RE SUPP: 650.0000 mg | Freq: Four times a day (QID) | RECTAL | Status: DC | PRN

## 2024-01-21 MED ORDER — SODIUM CHLORIDE 0.9% IV SOLUTION: Freq: Once | INTRAVENOUS | Status: AC

## 2024-01-21 MED ORDER — PANTOPRAZOLE SODIUM 40 MG PO TBEC
40.0000 mg | DELAYED_RELEASE_TABLET | Freq: Every day | ORAL | Status: DC
  Administered 2024-01-21 – 2024-01-22 (×2): 40 mg via ORAL

## 2024-01-21 NOTE — ED Provider Notes (Signed)
 [place] EMERGENCY DEPARTMENT AT [facility] Provider Note   CSN: [id number] Arrival date & time: 01/21/24  1116     History  Chief Complaint  Patient presents with   Fall   Nausea    [name] is a 63 y.o. male.  Patient is a 63 year old male with medical history of mixed hyperlipidemia, Graves' disease, GERD, bipolar 1 disorder, tobacco use, previous myocardial infarction's on Plavix and aspirin  presenting for 2 falls.  Patient states this morning at 2 AM he was attempting to urinate in the bathroom when he felt lightheaded and dizzy and syncopized to the floor.  He denies any known trauma.  States he is unsure if he lost consciousness or not but was able to get off the floor and go back to bed.  He states this morning he got up again and went to urinate felt lightheaded, dizzy, and like he was going to pass out.  He states he collapsed to the floor again.  He denies LOC however he states he woke up on the floor with an unknown downtime.  He denies chest pain or difficulty breathing.  He denies any infectious signs or symptoms including no fevers, chills, nausea, vomiting, diarrhea, abdominal pain, dysuria.  On note, on physical exam he has a large region of swelling with normal skin tone to the posterior ribs on the left.  Concerns for hematoma.  He states this was present prior to both of his falls.  He denies any other falls or back trauma that could have caused this swelling.  He denies difficulty breathing.  The history is provided by the patient. No language interpreter was used.  Fall Pertinent negatives include no chest pain, no abdominal pain and no shortness of breath.       Home Medications Prior to Admission medications   Medication Sig Start Date End Date Taking? Authorizing Provider  aspirin  EC 81 MG tablet Take 81 mg by mouth daily. Swallow whole.   Yes [provider]  atorvastatin  (LIPITOR) 80 MG tablet Take 1 tablet by mouth at bedtime.  10/12/23 10/11/24 Yes [provider]  buPROPion  (WELLBUTRIN  XL) 150 MG 24 hr tablet Take 450 mg by mouth daily.   Yes [provider]  Cholecalciferol  (VITAMIN D) 50 MCG (2000 UT) tablet Take 2,000 Units by mouth daily.   Yes [provider]  clopidogrel (PLAVIX) 75 MG tablet Take 1 tablet by mouth daily. 10/12/23  Yes [provider]  Cyanocobalamin  (B-12 PO) Take 1 capsule by mouth daily.   Yes [provider]  diclofenac  Sodium (VOLTAREN ) 1 % GEL Apply 1 Application topically 4 (four) times daily as needed (pain).   Yes [provider]  doxazosin  (CARDURA ) 4 MG tablet Take 4 mg by mouth 2 (two) times daily.   Yes [provider]  labetalol  (NORMODYNE ) 300 MG tablet Take 300 mg by mouth 2 (two) times daily. 12/13/15  Yes [provider]  levothyroxine  (SYNTHROID ) 137 MCG tablet Take 137 mcg by mouth daily before breakfast. 04/01/16  Yes [provider]  lisinopril  (PRINIVIL ,ZESTRIL ) 10 MG tablet Take 10 mg by mouth at bedtime.    Yes [provider]  nitroGLYCERIN  (NITROSTAT ) 0.4 MG SL tablet Place 1 tablet (0.4 mg total) under the tongue every 5 (five) minutes as needed for chest pain. 09/27/23  Yes Hugh Madura, MD  omeprazole (PRILOSEC) 20 MG capsule Take 20 mg by mouth daily.   Yes [provider]  oxyCODONE  (  ROXICODONE ) 15 MG immediate release tablet Take 30 mg by mouth daily as needed for pain. 01/25/23  Yes [provider]  QUEtiapine  (SEROQUEL ) 100 MG tablet Take 300 mg by mouth at bedtime. 06/06/21  Yes [provider]  temazepam  (RESTORIL ) 15 MG capsule Take 15 mg by mouth daily. 05/24/23  Yes [provider]  EPINEPHrine  (EPIPEN  2-PAK) 0.3 mg/0.3 mL IJ SOAJ injection Inject 0.3 mg into the muscle as needed for anaphylaxis.    [provider]  methocarbamol  (ROBAXIN ) 500 MG tablet Take 1 tablet (500 mg total) by mouth every 6 (six) hours as needed for muscle  spasms. Patient not taking: Reported on 01/21/2024 04/07/22   Winston Hawking, MD  naloxone [facility]) nasal spray 4 mg/0.1 mL Place 1 spray into the nose daily as needed (for overdose). 07/07/23   [provider]      Allergies    Bee venom and Cyclobenzaprine    Review of Systems   Review of Systems  Constitutional:  Negative for chills and fever.  HENT:  Negative for ear pain and sore throat.   Eyes:  Negative for pain and visual disturbance.  Respiratory:  Negative for cough and shortness of breath.   Cardiovascular:  Negative for chest pain and palpitations.  Gastrointestinal:  Negative for abdominal pain and vomiting.  Genitourinary:  Negative for dysuria and hematuria.  Musculoskeletal:  Positive for back pain. Negative for arthralgias.  Skin:  Negative for color change and rash.  Neurological:  Positive for syncope. Negative for seizures.  All other systems reviewed and are negative.   Physical Exam Updated Vital Signs BP (!) 156/72   Pulse 70   Temp 97.9 F (36.6 C) (Oral)   Resp 18   Ht 5\' 8"  (1.727 m)   Wt 64.4 kg   SpO2 100%   BMI 21.59 kg/m  Physical Exam Vitals and nursing note reviewed.  Constitutional:      General: He is not in acute distress.    Appearance: He is well-developed.  HENT:     Head: Normocephalic and atraumatic.  Eyes:     General: Lids are normal. Vision grossly intact.     Conjunctiva/sclera: Conjunctivae normal.     Pupils: Pupils are equal, round, and reactive to light.  Cardiovascular:     Rate and Rhythm: Normal rate and regular rhythm.     Heart sounds: No murmur heard. Pulmonary:     Effort: Pulmonary effort is normal. No respiratory distress.     Breath sounds: Normal breath sounds.  Abdominal:     Palpations: Abdomen is soft.     Tenderness: There is no abdominal tenderness.  Musculoskeletal:        General: No swelling.     Cervical back: Neck supple. No tenderness or bony tenderness.     Thoracic back: No tenderness  or bony tenderness.     Lumbar back: No tenderness or bony tenderness.       Back:  Skin:    General: Skin is warm and dry.     Capillary Refill: Capillary refill takes less than 2 seconds.  Neurological:     General: No focal deficit present.     Mental Status: He is alert and oriented to person, place, and time.     GCS: GCS eye subscore is 4. GCS verbal subscore is 5. GCS motor subscore is 6.     Cranial Nerves: Cranial nerves 2-12 are intact.     Sensory: Sensation is intact.  Motor: Motor function is intact.     Coordination: Coordination is intact.  Psychiatric:        Mood and Affect: Mood normal.     ED Results / Procedures / Treatments   Labs (all labs ordered are listed, but only abnormal results are displayed) Labs Reviewed  CBC WITH DIFFERENTIAL/PLATELET - Abnormal; Notable for the following components:      Result Value   RBC 2.85 (*)    Hemoglobin 8.7 (*)    HCT 26.4 (*)    Neutro Abs 8.6 (*)    All other components within normal limits  BASIC METABOLIC PANEL WITH GFR - Abnormal; Notable for the following components:   Chloride 113 (*)    CO2 20 (*)    Glucose, Bld 119 (*)    Calcium  7.3 (*)    All other components within normal limits  TYPE AND SCREEN  PREPARE RBC (CROSSMATCH)  TROPONIN I (HIGH SENSITIVITY)    EKG EKG Interpretation Date/Time:  Monday Jan 21 2024 11:20:52 EDT Ventricular Rate:  67 PR Interval:  153 QRS Duration:  103 QT Interval:  414 QTC Calculation: 437 R Axis:   74  Text Interpretation: Sinus rhythm Confirmed by Owen Blowers ([id number]) on 01/21/2024 3:35:32 PM  Radiology CT Angio Chest/Abd/Pel for Dissection W and/or Wo Contrast Result Date: 01/21/2024 CLINICAL DATA:  Large left posterior chest mass. Fell. On blood thinners. Chest CT dated 10/28/2020 EXAM: CT ANGIOGRAPHY CHEST, ABDOMEN AND PELVIS TECHNIQUE: Non-contrast CT of the chest was initially obtained. Multidetector CT imaging through the chest, abdomen and pelvis was  performed using the standard protocol during bolus administration of intravenous contrast. Multiplanar reconstructed images and MIPs were obtained and reviewed to evaluate the vascular anatomy. RADIATION DOSE REDUCTION: This exam was performed according to the departmental dose-optimization program which includes automated exposure control, adjustment of the mA and/or kV according to patient size and/or use of iterative reconstruction technique. CONTRAST:  OMNIPAQUE  IOHEXOL  350 MG/ML SOLN COMPARISON:  Chest CT dated 10/28/2020. FINDINGS: CTA CHEST FINDINGS Cardiovascular: Atheromatous calcifications, including the coronary arteries and aorta. Normally opacified pulmonary arteries with no pulmonary arterial filling defects seen. No thoracic aortic aneurysm or dissection. No active contrast extravasation. Small amount of pleural fluid. Mediastinum/Nodes: No enlarged mediastinal, hilar, or axillary lymph nodes. Thyroid  gland, trachea, and esophagus demonstrate no significant findings. Lungs/Pleura: Lungs are clear. No pleural effusion or pneumothorax. Musculoskeletal: Old, healed right rib fractures. No acute fractures. Left shoulder prosthesis. Mildly heterogeneous medium to higher density mass with some adjacent soft tissue stranding beneath the left serratus anterior and latissimus dorsi muscles with an appearance compatible with a hematoma. No active contrast extravasation. This measures 7.3 x 4.1 cm on image number 46/4 and 25.5 cm in length on coronal image number 49/12. Review of the MIP images confirms the above findings. CTA ABDOMEN AND PELVIS FINDINGS VASCULAR Aorta: Extensive atheromatous calcifications without aneurysm or dissection. Celiac: Patent without evidence of aneurysm, dissection, vasculitis or significant stenosis. SMA: Calcified plaque at the origin producing approximately 20% luminal narrowing. Renals: Calcified and noncalcified plaque producing approximately 30% luminal stenosis involving  the proximal left renal artery. No significant right renal artery stenosis. IMA: Patent without evidence of aneurysm, dissection, vasculitis or significant stenosis. Inflow: Calcified and noncalcified plaque without significant luminal stenosis. Veins: No obvious venous abnormality within the limitations of this arterial phase study. Review of the MIP images confirms the above findings. NON-VASCULAR Hepatobiliary: No focal liver abnormality is seen. Status post cholecystectomy. No biliary dilatation.  Pancreas: Unremarkable. No pancreatic ductal dilatation or surrounding inflammatory changes. Spleen: Normal in size without focal abnormality. Adrenals/Urinary Tract: No significant change in mildly heterogeneous, medium density left adrenal nodule measuring 1.9 x 1.2 cm on image number 147/7. On the precontrast images, this measures 19 Hounsfield units in density. Unremarkable kidneys, ureters and urinary bladder. Stomach/Bowel: Unremarkable stomach, small bowel and colon. The appendix is not visualized. No secondary signs of appendicitis. Lymphatic: No enlarged lymph nodes. Reproductive: Mildly to moderately enlarged prostate gland. Other: No abdominal wall hernia or abnormality. No abdominopelvic ascites. Musculoskeletal: Mild lumbar spine degenerative changes. No fractures, subluxations or dislocations. Review of the MIP images confirms the above findings. IMPRESSION: 1. 25.5 x 7.3 x 4.1 cm hematoma beneath the left serratus anterior and latissimus dorsi muscles. No active contrast extravasation. 2. No acute fractures. 3. Small left pleural effusion. 4. Extensive atherosclerotic calcifications, including the coronary arteries. 5. No acute abnormality in the abdomen or pelvis. 6. Mildly to moderately enlarged prostate gland. 7. Stable 1.9 x 1.2 cm left adrenal nodule, most likely an adenoma based on the long-term stability. This does not require imaging follow-up. Electronically Signed   By: Catherin Closs M.D.   On:  01/21/2024 15:21   DG Chest Portable 1 View Result Date: 01/21/2024 CLINICAL DATA:  Fall, left chest wall injury with swelling, anticoagulated EXAM: PORTABLE CHEST 1 VIEW COMPARISON:  06/24/2021 FINDINGS: Mild hyperinflation suggesting component of emphysema. Normal heart size and vascularity. No focal airspace process, collapse or consolidation. Negative for edema, large effusion or pneumothorax. Degenerative changes of the spine with associated levoscoliosis. Aorta atherosclerotic. Removed left shoulder arthroplasty changes. Left lateral chest wall soft tissue asymmetry correlates with soft tissue bruising by comparison chest CT also from today. IMPRESSION: 1. Emphysema. 2. No acute chest process. Electronically Signed   By: Melven Stable.  Shick M.D.   On: 01/21/2024 13:30    Procedures .Critical Care  Performed by: Quinn Bucco, DO Authorized by: Quinn Bucco, DO   Critical care provider statement:    Critical care time (minutes):  101   Critical care was necessary to treat or prevent imminent or life-threatening deterioration of the following conditions: blood transfusion.   Critical care was time spent personally by me on the following activities:  Development of treatment plan with patient or surrogate, discussions with consultants, evaluation of patient's response to treatment, examination of patient, ordering and review of laboratory studies, ordering and review of radiographic studies, ordering and performing treatments and interventions, pulse oximetry, re-evaluation of patient's condition and review of old charts     Medications Ordered in ED Medications  0.9 %  sodium chloride  infusion (Manually program via Guardrails IV Fluids) (has no administration in time range)  sodium chloride  0.9 % bolus 1,000 mL (0 mLs Intravenous Stopped 01/21/24 1333)  iohexol  (OMNIPAQUE ) 350 MG/ML injection 100 mL (100 mLs Intravenous Contrast Given 01/21/24 1313)    ED Course/ Medical Decision Making/ A&P                                  Medical Decision Making Amount and/or Complexity of Data Reviewed Labs: ordered. Radiology: ordered.  Risk Prescription drug management. Decision regarding hospitalization.   Patient presenting for 2 falls in the last 12 hours.  He has a large localized hematoma to his left posterior back that is nontender on exam.  He states this was present prior to his 2 falls.  He  denies any back trauma.  Concerns for expanding hematoma.  No blood thinner use however he is on Plavix and aspirin .  His hemoglobin is dropped from 8.7 today down from 12.8 that was reported in 2023.  No recent laboratory studies outside of that to compare.  CT chest abdomen pelvis demonstrates a 25.5 x 7.3 x 4.1 cm hematoma beneath the left serratus anterior and latissimus dorsi muscles.  No active contrast extravasation.  No acute fractures.  Twelve-lead EKG demonstrates normal sinus rhythm with a rate of 97 bpm.  No ST segment elevation depression.  No concerning T wave inversions.  Stable intervals.  Stable electrolytes.  Troponin pending however I doubt cardiac etiology at this time in favor syncope related to anemia.  Hold Plavix for now.  Doubt cardiac etiology.  Recommend admission for transfusion and observation.  Patient accepted by admitting physician Dr. Felipe Horton.          Final Clinical Impression(s) / ED Diagnoses Final diagnoses:  Anemia, unspecified type  Hematoma  Syncope, unspecified syncope type    Rx / DC Orders ED Discharge Orders     None         Quinn Bucco, DO 01/21/24 1601

## 2024-01-21 NOTE — ED Triage Notes (Signed)
 Pt BIB EMS from home for frequent falls. Fall this AM does not rem event. Denies injury, however, upon arrival L posterior back edema. On Plavix for blood clots in brain. Has been having NVD since yesterday.

## 2024-01-21 NOTE — H&P (Signed)
 History and Physical    Patient: [name] EAV:[id number] DOB: 21-Mar-1961 DOA: 01/21/2024 DOS: the patient was seen and examined on 01/21/2024 PCP: Benedetta Bradley, MD  Patient coming from: Home via EMS  Chief Complaint:  Chief Complaint  Patient presents with   Fall   Nausea   HPI: [name] is a 63 y.o. male with medical history significant of hypertension, hyperlipidemia, CAD hypothyroidism, GERD presents after having two falls in the past 24 hours.  One of the falls occurred this morning around 2 AM while he was attempting to go urinate.  He reported feeling lightheaded and dizzy prior to waking up on the floor with his head in a trash can.  Second episode occurred after getting up this morning against the bathroom.  He is unclear the time of loss of consciousness.  He notes having a large hematoma on the left side of his upper back, near the ribs. The hematoma appeared before the falls, possibly due to lifting heavy pots the previous day, and had been present since around 5 PM the day before the falls. He suspects he may have pulled a muscle. The area is swollen and painful, though not severely, and he can raise his arm despite the discomfort. His father had history of a similar issue on the other side of his back, which resolved with conservative management.  He is currently on aspirin  and Plavix, which he did not take today but took yesterday morning.  Denies having any black/dark or bloody stools.  In the emergency department patient was evaluated and noted to be tachycardic with heart rates 58-126, blood pressure 95/49-131/67, and oxygen saturations maintained on room air.  Labs significant for hemoglobin 8.7, CO2 20, BUN 18, creatinine 0.78, and calcium  7.3.  Chest x-ray noted emphysema without acute process.  CT angiogram had been obtained of the chest abdomen and pelvis which revealed a 25.5 x 7.3 x 4.1 cm hematoma beneath the left serratus anterior and latissimus  dorsi muscles.  Patient was typed and screened and ordered 1 unit of packed red blood cells.    Review of Systems: As mentioned in the history of present illness. All other systems reviewed and are negative. Past Medical History:  Diagnosis Date   Anxiety    Arthritis    neck and knee   Barrett's esophagus    Bipolar 1 disorder (HCC)    CAD in native artery    Cervical disc syndrome    Depression    GERD (gastroesophageal reflux disease)    Grave's disease    Graves' disease 10/04/2011   H/O prostatitis    Headache    Hiatal hernia    Hypertension    Hypothyroidism    Lumbar degenerative disc disease    Mild carotid artery disease (HCC)    Mixed hyperlipidemia 12/27/2017   Myocardial infarction (HCC) 2000   Mild heart attack   Stenosis of left vertebrobasilar artery    Stenosis of right subclavian artery (HCC)    Tobacco abuse 12/27/2017   Past Surgical History:  Procedure Laterality Date   ANTERIOR CERVICAL DECOMP/DISCECTOMY FUSION N/A 07/13/2021   Procedure: ANTERIOR CERVICAL DECOMPRESSION/DISCECTOMY FUSION CERVICAL THREE-FOUR, CERVICAL FOUR -FIVE;  Surgeon: Mort Ards, MD;  Location: [facility] OR;  Service: Orthopedics;  Laterality: N/A;  ANTERIOR CERVICAL DECOMPRESSION/DISCECTOMY FUSION CERVICAL THREE-FOUR, CERVICAL FOUR -FIVE   CARDIAC CATHETERIZATION  2000   CHOLECYSTECTOMY  12/11/2013   DR Elvan Hamel    CHOLECYSTECTOMY N/A 12/11/2013   Procedure: LAPAROSCOPIC CHOLECYSTECTOMY  WITH INTRAOPERATIVE CHOLANGIOGRAM;  Surgeon: Fran Imus, MD;  Location: [facility] OR;  Service: General;  Laterality: N/A;   IR ANGIO INTRA EXTRACRAN SEL COM CAROTID INNOMINATE BILAT MOD SED  05/02/2018   IR ANGIO VERTEBRAL SEL VERTEBRAL BILAT MOD SED  05/02/2018   KNEE SURGERY     LEFT HEART CATH AND CORONARY ANGIOGRAPHY N/A 03/12/2018   Procedure: LEFT HEART CATH AND CORONARY ANGIOGRAPHY;  Surgeon: Arty Binning, MD;  Location: [facility] INVASIVE CV LAB;  Service: Cardiovascular;  Laterality: N/A;   REVERSE  SHOULDER ARTHROPLASTY Left 04/07/2022   Procedure: REVERSE SHOULDER ARTHROPLASTY;  Surgeon: Winston Hawking, MD;  Location: [facility] ORS;  Service: Orthopedics;  Laterality: Left;  with ISB   testicular torsion surgery     THYROIDECTOMY     Social History:  reports that he quit smoking about 2 years ago. His smoking use included cigarettes. He started smoking about 37 years ago. He has a 8.8 pack-year smoking history. He has never used smokeless tobacco. He reports that he does not drink alcohol and does not use drugs.  Allergies  Allergen Reactions   Bee Venom Anaphylaxis   Cyclobenzaprine     hypotension    Family History  Problem Relation Age of Onset   Hypertension Mother    Hypertension Father    Valvular heart disease Father    Cancer Other    Hypertension Other     Prior to Admission medications   Medication Sig Start Date End Date Taking? Authorizing Provider  aspirin  EC 81 MG tablet Take 81 mg by mouth daily. Swallow whole.   Yes [provider]  atorvastatin  (LIPITOR) 80 MG tablet Take 1 tablet by mouth at bedtime. 10/12/23 10/11/24 Yes [provider]  buPROPion  (WELLBUTRIN  XL) 150 MG 24 hr tablet Take 450 mg by mouth daily.   Yes [provider]  Cholecalciferol  (VITAMIN D) 50 MCG (2000 UT) tablet Take 2,000 Units by mouth daily.   Yes [provider]  clopidogrel (PLAVIX) 75 MG tablet Take 1 tablet by mouth daily. 10/12/23  Yes [provider]  Cyanocobalamin  (B-12 PO) Take 1 capsule by mouth daily.   Yes [provider]  diclofenac  Sodium (VOLTAREN ) 1 % GEL Apply 1 Application topically 4 (four) times daily as needed (pain).   Yes [provider]  doxazosin  (CARDURA ) 4 MG tablet Take 4 mg by mouth 2 (two) times daily.   Yes [provider]  labetalol  (NORMODYNE ) 300 MG tablet Take 300 mg by mouth 2 (two) times daily. 12/13/15  Yes [provider]  levothyroxine  (SYNTHROID ) 137 MCG tablet Take 137 mcg by  mouth daily before breakfast. 04/01/16  Yes [provider]  lisinopril  (PRINIVIL ,ZESTRIL ) 10 MG tablet Take 10 mg by mouth at bedtime.    Yes [provider]  nitroGLYCERIN  (NITROSTAT ) 0.4 MG SL tablet Place 1 tablet (0.4 mg total) under the tongue every 5 (five) minutes as needed for chest pain. 09/27/23  Yes Hugh Madura, MD  omeprazole (PRILOSEC) 20 MG capsule Take 20 mg by mouth daily.   Yes [provider]  oxyCODONE  (ROXICODONE ) 15 MG immediate release tablet Take 30 mg by mouth daily as needed for pain. 01/25/23  Yes [provider]  QUEtiapine  (SEROQUEL ) 100 MG tablet Take 300 mg by mouth at bedtime. 06/06/21  Yes [provider]  temazepam  (RESTORIL ) 15 MG capsule Take 15 mg by mouth daily. 05/24/23  Yes [provider]  EPINEPHrine  (EPIPEN  2-PAK) 0.3 mg/0.3 mL IJ  SOAJ injection Inject 0.3 mg into the muscle as needed for anaphylaxis.    [provider]  methocarbamol  (ROBAXIN ) 500 MG tablet Take 1 tablet (500 mg total) by mouth every 6 (six) hours as needed for muscle spasms. Patient not taking: Reported on 01/21/2024 04/07/22   Winston Hawking, MD  naloxone [facility]) nasal spray 4 mg/0.1 mL Place 1 spray into the nose daily as needed (for overdose). 07/07/23   [provider]    Physical Exam: Vitals:   01/21/24 1315 01/21/24 1330 01/21/24 1345 01/21/24 1400  BP: (!) 95/49 98/68 126/69 124/65  Pulse: 62 67 62 (!) 59  Resp: 14 16 15 10   Temp:      TempSrc:      SpO2: 100% 100% 100% 100%  Weight:      Height:       Constitutional: Older adult male who appears to be in no acute distress at this time Eyes: PERRL, lids and conjunctivae normal ENMT: Mucous membranes are moist.  Normal dentition.  Neck: normal, supple, no masses, no thyromegaly Respiratory: clear to auscultation bilaterally, no wheezing, no crackles. Normal respiratory effort. No accessory muscle use.  Cardiovascular: Regular rate and rhythm, no murmurs  / rubs / gallops. No extremity edema. 2+ pedal pulses. No carotid bruits.  Abdomen: no tenderness, no masses palpated. No hepatosplenomegaly. Bowel sounds positive.  Musculoskeletal: no clubbing / cyanosis.  Large mass noted of the left upper back that is tender to palpation.  Mild creased range of motion of the left upper extremity. Skin: no rashes, lesions, ulcers. No visible bruising noted of the skin. Neurologic: CN 2-12 grossly intact. Sensation intact, DTR normal. Strength 5/5 in all 4.  Psychiatric: Normal judgment and insight. Alert and oriented x 3. Normal mood.   Data Reviewed:  EKG revealed a sinus rhythm at 67 bpm.  Reviewed labs, imaging, and pertinent records as documented.  Assessment and Plan: Syncope Prior to arrival.  Patient presents after having what sounds like 2 syncopal episodes this morning with suspected, but unknown loss of consciousness.  He does report likely hitting his head.  He is on blood thinners of aspirin  and Plavix and last took them yesterday.  Blood pressures were noted to be as low as 95/49.  Initial high-sensitivity troponin was negative.  Suspect orthostatic hypotension as the cause of his falls/syncopal episodes related to blood loss. - Admit to a cardiac telemetry bed - Follow-up repeat troponin - Check orthostatic vital signs - Check CT scan of the brain - Hold home blood pressure regimen.  Determine when medically appropriate to resume - Follow telemetry overnight - PT to evaluate in a.m. for safety  Hematoma of the back  Acute.  Patient reports presence of a large knot on the left-hand side of his back starting yesterday afternoon after he had been lifting heavy pots.  He had questioned if he possibly pulled a muscle.  Patient feels this was present prior to his falls this morning.  CT angiogram of the chest abdomen pelvis revealed a 25.5 x 7.3 x 4.1 cm hematoma beneath the left serratus anterior and latissimus dorsi muscles without active  extravasation. - Discussed over the phone with general surgery who agreed with conservative measures and pain control - May warrant repeat imaging if hematoma persist/enlarges as possible complications could occur such as abscess or compartment syndrome  Acute blood loss anemia Hemoglobin noted to be 8.7. Baseline hemoglobin previously noted to be 12.8 when checked back in 03/2022.  He was  typed and screened and ordered 1 unit of packed red blood cells due to his syncopal episodes. - Continue with transfusion of 1 unit of packed red blood cells - Recheck H&H in a.m. - Hold Plavix and aspirin   CAD Patient with prior history of coronary artery disease.  Noted to have mild to moderate nonobstructive coronary disease on last cath. - Held aspirin  and Plavix mix due to hematoma - Continue statin  Postsurgical hypothyroidism Patient has history of Graves' disease. - Continue levothyroxine   Hyperlipidemia - Continue atorvastatin   DVT prophylaxis: SCDs Advance Care Planning:   Code Status: Full Code   Consults: None Family Communication: Patient's daughter updated at bedside Severity of Illness: The appropriate patient status for this patient is OBSERVATION. Observation status is judged to be reasonable and necessary in order to provide the required intensity of service to ensure the patient's safety. The patient's presenting symptoms, physical exam findings, and initial radiographic and laboratory data in the context of their medical condition is felt to place them at decreased risk for further clinical deterioration. Furthermore, it is anticipated that the patient will be medically stable for discharge from the hospital within 2 midnights of admission.   Author: Lena Qualia, MD 01/21/2024 3:56 PM  For on call review [email or web address].

## 2024-01-21 NOTE — ED Notes (Signed)
 Patient transported to CT

## 2024-01-22 ENCOUNTER — Observation Stay (HOSPITAL_BASED_OUTPATIENT_CLINIC_OR_DEPARTMENT_OTHER)

## 2024-01-22 DIAGNOSIS — R55 Syncope and collapse: Secondary | ICD-10-CM

## 2024-01-22 DIAGNOSIS — I2510 Atherosclerotic heart disease of native coronary artery without angina pectoris: Secondary | ICD-10-CM | POA: Diagnosis not present

## 2024-01-22 DIAGNOSIS — I10 Essential (primary) hypertension: Secondary | ICD-10-CM | POA: Diagnosis not present

## 2024-01-22 DIAGNOSIS — E785 Hyperlipidemia, unspecified: Secondary | ICD-10-CM | POA: Diagnosis not present

## 2024-01-22 LAB — TYPE AND SCREEN
ABO/RH(D): B POS
Antibody Screen: NEGATIVE
Unit division: 0

## 2024-01-22 LAB — BPAM RBC
Blood Product Expiration Date: 202506142359
ISSUE DATE / TIME: 202505191741
Unit Type and Rh: 7300

## 2024-01-22 LAB — ECHOCARDIOGRAM COMPLETE
AR max vel: 1.2 cm2
AV Area VTI: 1.2 cm2
AV Area mean vel: 1.19 cm2
AV Mean grad: 6.5 mmHg
AV Peak grad: 11.6 mmHg
Ao pk vel: 1.7 m/s
Area-P 1/2: 3.6 cm2
Height: 68 in
S' Lateral: 3.3 cm
Weight: 2272 [oz_av]

## 2024-01-22 LAB — CBC
HCT: 27.8 % — ABNORMAL LOW (ref 39.0–52.0)
Hemoglobin: 9.7 g/dL — ABNORMAL LOW (ref 13.0–17.0)
MCH: 31.2 pg (ref 26.0–34.0)
MCHC: 34.9 g/dL (ref 30.0–36.0)
MCV: 89.4 fL (ref 80.0–100.0)
Platelets: 160 10*3/uL (ref 150–400)
RBC: 3.11 MIL/uL — ABNORMAL LOW (ref 4.22–5.81)
RDW: 13 % (ref 11.5–15.5)
WBC: 7.6 10*3/uL (ref 4.0–10.5)
nRBC: 0 % (ref 0.0–0.2)

## 2024-01-22 NOTE — Hospital Course (Addendum)
 63 y.o. male with medical history significant of hypertension, hyperlipidemia, CAD hypothyroidism, GERD presents after having two falls in the past 24 hours PTA. Around 2 am he was attempting to go urinate and he fell,feeling lightheaded and dizzy prior to waking up on the floor with his head in a trash can and second episode occurred after getting up on 5/19 morning against the bathroom.  He is unclear the time of loss of consciousness.  Patient was reporting large hematoma on the left side of his upper back before the fall, suspects he may have pulled a muscle. In the ZO:[id number], blood pressure 95/49-131/67, on RA Labs> hemoglobin 8.7, CO2 20, BUN 18, creatinine 0.78, and calcium  7.3.  CXR>emphysema without acute process. CT angiogramchest abdomen and pelvis>>revealed a 25.5 x 7.3 x 4.1 cm hematoma beneath the left serratus anterior and latissimus dorsi muscles.  Patient was typed and screened and ordered 1 unit of packed red blood cells and admitted Serial troponins negative.  Patient was admitted to monitored overnight. Repeat hemoglobin has improved Seen by cardiology and cleared for discharge home  Subjective: Seen this am Feels well Overnight patient is afebrile BP stable on room air  Discharge diagnosis Syncope Falls x 2 at home: Suspect orthostatic. CT head negative, serial troponins are negative.  Antihypertensive held on admission.tele monitored overnight. Getting a limited echo. Orthostatic vitals negative currently. Will consult cardiology, may need Op monitor. He has had previous syncope orthostatic hypotension and previous admission had Zio monitor x 13 days in 07/2023.  Cardiology advised to hold antihypertensive at blood pressure is stable currently without medication.  Cardiology arranging outpatient follow-up, encouraged to hydrate orally.  "Echocardiogram resulted stable as below eft ventricular ejection fraction, by estimation, is 55 to 60%. The left ventricle has  normal function. The left ventricle has no regional wall motion abnormalities. Left ventricular diastolic parameters were normal.  2. Right ventricular systolic function is normal. The right ventricular size is normal. Tricuspid regurgitation signal is inadequate for assessing PA pressure.  3. The mitral valve is normal in structure. No evidence of mitral valve regurgitation. No evidence of mitral stenosis.  4. The aortic valve is grossly normal. There is mild calcification of the aortic valve. There is mild thickening of the aortic valve. Aortic valve regurgitation is not visualized. Mild aortic valve stenosis.  5. The inferior vena cava is normal in size with greater than 50% respiratory variability, suggesting right atrial pressure of 3 mmHg."  Hematoma over the back, acute ABLA: CT angio chest abdomen pelvis revealed as above hematoma left serratus anterior and latissimus dorsi muscle. Received 1 unit PRBC.  Follow-up repeat hemoglobin. Asa/Plavix has been held on admission> Per Dr Arlester Ladd "he can start back on ASA anytime. I would just hold plavix-and could start back in a week, as Plavix was started for intracranial stenosis" advised to follow-up with PCP for recheck in hemoglobin Recent Labs  Lab 01/21/24 1141 01/22/24 1134  HGB 8.7* 9.7*  HCT 26.4* 27.8*    CAD: Had mild to moderate nonobstructive coronary disease on last cath 7 9/19 PTA on aspirin  and Plavix-held on admission.On statin F/B Dr Tamra Falling   Hypothyroidism History of Graves' disease: Continue home Synthroid   HLD: Continue statin

## 2024-01-22 NOTE — Consult Note (Addendum)
 Cardiology Consultation   Patient ID: [name] MRN: [id number]; DOB: 09/16/1960  Admit date: 01/21/2024 Date of Consult: 01/22/2024  PCP:  Benedetta Bradley, MD   [facility] Providers Cardiologist:  Olinda Bertrand, DO        Patient Profile:   [name] is a 63 y.o. male with a hx of moderate nonobstructive CAD on cath in 2019, hyperlipidemia, hypertension, orthostatic hypotension, aortic atherosclerosis, history of tobacco abuse, bipolar, Barrett's esophagus, Graves disease who is being seen 01/22/2024 for the evaluation of syncope at the request of Lesa Rape MD.  History of Present Illness:   [name] is a 63 y.o. male with prior cardiac history shown below. He had a LHC on 03/2018 for atypical chest pain. The LHC showed moderate nonobstructive CAD with 50 to 60% distal circumflex, 50 to 60% proximal first diagonal, 20 to 30% proximal to distal RCA, and luminal irregularities in the proximal to distal LAD. This was managed medically  The patient reportedly has an unwitnessed syncopal episode in about 05/2023. The patients primary ordered a Zio monitor and the patient wore this on 07/2023. A report was sent over and showed a very short episode of SVT but no ventricular tachycardia, pauses, atrial fibrillation, or AV block. The patient triggered the monitor 13 times. Some of those times were for lightheadedness was in normal sinus rhythm when triggering the monitor. His most recent echo was on 08/2023 and showed an LVEF of 55-60% no RWMA, GIDD, mild aortic calcification and was otherwise unremarkable. Followed up with Dr Karlos Overlie on 11/2023 and the patient was stable at that time.   The patient was [facility] for asymptomatic moderate stenosis of the left ICA and stenosis of the left vertebral artery. At that visit the patient was noted to have vital signs consistent with orthostatic hypotension but was a symptomatic. At that visit the  patient was also started on plavix 75mg  daily.  The patient presented to the ER on 01/21/24 following a syncopal collapse. The patient was reportedly got up at 5 am to go to the bathroom. He reported that he sat on the toilet and used the bathroom. After doing this he stood back up and became lightheaded and fell. He was able to get up off the floor and go to bed. After going back to bed he reported vomiting a small amount of a clear liquid. He then got up again this morning at 7 am to urinate and after standing up from his bed felt light headed and collapsed again. He does not know how long he was on the floor before getting back up.  He fell on a trash can in his room and has a hematoma on his left ribs. Denies chest pain, shortness of breath, fever, diarrhea, palpitations, melena, hematochezia, hematouria, diarrhea. Blood pressures were initially as low as 95/49 in the ER but subsequently improved. Reported using tobacco in the past.  Labs showed a hemoglobin of 8.7 and patient received a unit of blood hemoglobin this morning was 9.7. EKG showed sinus rhythm with a rate of 67 and no acute ischemic changes. CT head and neck was negative for any acute changes. CT chest, abdomen and pelvis showed 25.5 x 7.3 x 4.1 cm hematoma beneath the left serratus anterior and latissimus dorsi muscles, No active contrast extravasation, No acute fractures, Small left pleural effusion, Extensive atherosclerotic calcifications, including the coronary arteries, and no acute abnormality in the abdomen or pelvis.  Past Medical History:  Diagnosis Date   Anxiety    Arthritis    neck and knee   Barrett's esophagus    Bipolar 1 disorder (HCC)    CAD in native artery    Cervical disc syndrome    Depression    GERD (gastroesophageal reflux disease)    Grave's disease    Graves' disease 10/04/2011   H/O prostatitis    Headache    Hiatal hernia    Hypertension    Hypothyroidism    Lumbar degenerative disc disease     Mild carotid artery disease (HCC)    Mixed hyperlipidemia 12/27/2017   Myocardial infarction (HCC) 2000   Mild heart attack   Stenosis of left vertebrobasilar artery    Stenosis of right subclavian artery (HCC)    Tobacco abuse 12/27/2017    Past Surgical History:  Procedure Laterality Date   ANTERIOR CERVICAL DECOMP/DISCECTOMY FUSION N/A 07/13/2021   Procedure: ANTERIOR CERVICAL DECOMPRESSION/DISCECTOMY FUSION CERVICAL THREE-FOUR, CERVICAL FOUR -FIVE;  Surgeon: Mort Ards, MD;  Location: [facility] OR;  Service: Orthopedics;  Laterality: N/A;  ANTERIOR CERVICAL DECOMPRESSION/DISCECTOMY FUSION CERVICAL THREE-FOUR, CERVICAL FOUR -FIVE   CARDIAC CATHETERIZATION  09/04/1998   CHOLECYSTECTOMY  12/11/2013   DR Elvan Hamel    CHOLECYSTECTOMY N/A 12/11/2013   Procedure: LAPAROSCOPIC CHOLECYSTECTOMY WITH INTRAOPERATIVE CHOLANGIOGRAM;  Surgeon: Fran Imus, MD;  Location: [facility] OR;  Service: General;  Laterality: N/A;   IR ANGIO INTRA EXTRACRAN SEL COM CAROTID INNOMINATE BILAT MOD SED  05/02/2018   IR ANGIO VERTEBRAL SEL VERTEBRAL BILAT MOD SED  05/02/2018   KNEE SURGERY     LEFT HEART CATH AND CORONARY ANGIOGRAPHY N/A 03/12/2018   Procedure: LEFT HEART CATH AND CORONARY ANGIOGRAPHY;  Surgeon: Arty Binning, MD;  Location: [facility] INVASIVE CV LAB;  Service: Cardiovascular;  Laterality: N/A;   REVERSE SHOULDER ARTHROPLASTY Left 04/07/2022   Procedure: REVERSE SHOULDER ARTHROPLASTY;  Surgeon: Winston Hawking, MD;  Location: [facility] ORS;  Service: Orthopedics;  Laterality: Left;  with ISB   testicular torsion surgery     THYROIDECTOMY       Home Medications:  Prior to Admission medications   Medication Sig Start Date End Date Taking? Authorizing Provider  aspirin  EC 81 MG tablet Take 81 mg by mouth daily. Swallow whole.   Yes [provider]  atorvastatin  (LIPITOR) 80 MG tablet Take 1 tablet by mouth at bedtime. 10/12/23 10/11/24 Yes [provider]  buPROPion  (WELLBUTRIN  XL) 150 MG 24 hr tablet  Take 450 mg by mouth daily.   Yes [provider]  Cholecalciferol  (VITAMIN D) 50 MCG (2000 UT) tablet Take 2,000 Units by mouth daily.   Yes [provider]  clopidogrel (PLAVIX) 75 MG tablet Take 1 tablet by mouth daily. 10/12/23  Yes [provider]  Cyanocobalamin  (B-12 PO) Take 1 capsule by mouth daily.   Yes [provider]  diclofenac  Sodium (VOLTAREN ) 1 % GEL Apply 1 Application topically 4 (four) times daily as needed (pain).   Yes [provider]  doxazosin  (CARDURA ) 4 MG tablet Take 4 mg by mouth 2 (two) times daily.   Yes [provider]  labetalol  (NORMODYNE ) 300 MG tablet Take 300 mg by mouth 2 (two) times daily. 12/13/15  Yes [provider]  levothyroxine  (SYNTHROID ) 137 MCG tablet Take 137 mcg by mouth daily before breakfast. 04/01/16  Yes [provider]  lisinopril  (PRINIVIL ,ZESTRIL ) 10 MG tablet Take 10 mg by mouth at bedtime.    Yes [provider]  nitroGLYCERIN  (  NITROSTAT ) 0.4 MG SL tablet Place 1 tablet (0.4 mg total) under the tongue every 5 (five) minutes as needed for chest pain. 09/27/23  Yes Hugh Madura, MD  omeprazole (PRILOSEC) 20 MG capsule Take 20 mg by mouth daily.   Yes [provider]  oxyCODONE  (ROXICODONE ) 15 MG immediate release tablet Take 30 mg by mouth daily as needed for pain. 01/25/23  Yes [provider]  QUEtiapine  (SEROQUEL ) 100 MG tablet Take 300 mg by mouth at bedtime. 06/06/21  Yes [provider]  temazepam  (RESTORIL ) 15 MG capsule Take 15 mg by mouth daily. 05/24/23  Yes [provider]  EPINEPHrine  (EPIPEN  2-PAK) 0.3 mg/0.3 mL IJ SOAJ injection Inject 0.3 mg into the muscle as needed for anaphylaxis.    [provider]  methocarbamol  (ROBAXIN ) 500 MG tablet Take 1 tablet (500 mg total) by mouth every 6 (six) hours as needed for muscle spasms. Patient not taking: Reported on 01/21/2024 04/07/22   Winston Hawking, MD  naloxone [facility])  nasal spray 4 mg/0.1 mL Place 1 spray into the nose daily as needed (for overdose). 07/07/23   [provider]    Inpatient Medications: Scheduled Meds:  atorvastatin   80 mg Oral QHS   buPROPion   450 mg Oral Daily   doxazosin   4 mg Oral BID   levothyroxine   137 mcg Oral QAC breakfast   pantoprazole   40 mg Oral Daily   QUEtiapine   300 mg Oral QHS   sodium chloride  flush  3 mL Intravenous Q12H   temazepam   15 mg Oral Daily   Continuous Infusions:  PRN Meds: acetaminophen  **OR** acetaminophen , albuterol , diclofenac  Sodium, ondansetron  **OR** ondansetron  (ZOFRAN ) IV, oxyCODONE   Allergies:    Allergies  Allergen Reactions   Bee Venom Anaphylaxis   Cyclobenzaprine     hypotension    Social History:   Social History   Socioeconomic History   Marital status: Divorced    Spouse name: Not on file   Number of children: 1   Years of education: Not on file   Highest education level: Some college, no degree  Occupational History   Not on file  Tobacco Use   Smoking status: Former    Current packs/day: 0.00    Average packs/day: 0.3 packs/day for 35.0 years (8.8 ttl pk-yrs)    Types: Cigarettes    Start date: 05/05/1986    Quit date: 05/05/2021    Years since quitting: 2.7   Smokeless tobacco: Never  Vaping Use   Vaping status: Never Used  Substance and Sexual Activity   Alcohol use: No   Drug use: No   Sexual activity: Never  Other Topics Concern   Not on file  Social History Narrative   Lives at home with his parents    Right handed   Caffeine: about 36 oz of soda daily   Social Drivers of Corporate investment banker Strain: Not on file  Food Insecurity: No Food Insecurity (01/21/2024)   Hunger Vital Sign    Worried About Running Out of Food in the Last Year: Never true    Ran Out of Food in the Last Year: Never true  Transportation Needs: No Transportation Needs (01/21/2024)   PRAPARE - Administrator, Civil Service (Medical): No    Lack of  Transportation (Non-Medical): No  Physical Activity: Not on file  Stress: Not on file  Social Connections: Not on file  Intimate Partner Violence: Not At Risk (01/21/2024)   Humiliation, Afraid, Rape,  and Kick questionnaire    Fear of Current or Ex-Partner: No    Emotionally Abused: No    Physically Abused: No    Sexually Abused: No    Family History:    Family History  Problem Relation Age of Onset   Hypertension Mother    Hypertension Father    Valvular heart disease Father    Cancer Other    Hypertension Other      ROS:  Please see the history of present illness.   All other ROS reviewed and negative.     Physical Exam/Data:   Vitals:   01/21/24 1805 01/21/24 2032 01/22/24 0547 01/22/24 0758  BP: 129/71 125/69 119/77 124/73  Pulse: 72 77 70 77  Resp: 18  16 19   Temp: 98.3 F (36.8 C) 98.7 F (37.1 C) 97.9 F (36.6 C) 98.2 F (36.8 C)  TempSrc: Oral Oral Oral Oral  SpO2: 100% 98% 97% 97%  Weight:      Height:        Intake/Output Summary (Last 24 hours) at 01/22/2024 1141 Last data filed at 01/22/2024 2952 Gross per 24 hour  Intake 1357.5 ml  Output 500 ml  Net 857.5 ml      01/21/2024   11:21 AM 11/13/2023    8:38 AM 08/02/2022    9:13 AM  Last 3 Weights  Weight (lbs) 142 lb 154 lb 162 lb  Weight (kg) 64.411 kg 69.854 kg 73.483 kg     Body mass index is 21.59 kg/m.  General:  Well nourished, well developed, in no acute distress HEENT: normal Neck: no JVD Vascular: carotid bruits present; Distal pulses 2+ bilaterally Cardiac:  normal S1, S2; RRR; 2/6 systolic murmur on left sternal border Lungs:  clear to auscultation bilaterally, no wheezing, rhonchi or rales  Abd: soft, nontender, no hepatomegaly  Ext: no edema Musculoskeletal:  No deformities, hematoma on posterior left ribs Skin: warm and dry  Neuro:  no focal abnormalities noted Psych:  Normal affect   EKG:  The EKG was personally reviewed and demonstrates:  sinus rhythm with a rate of 67  and no acute ischemic changes. Telemetry:  Telemetry was personally reviewed and demonstrates:  normal sinus rhythm  Relevant CV Studies: Echo pending  Laboratory Data:  High Sensitivity Troponin:   Recent Labs  Lab 01/21/24 1535 01/21/24 1715  TROPONINIHS 5 3     Chemistry Recent Labs  Lab 01/21/24 1141  NA 141  K 3.6  CL 113*  CO2 20*  GLUCOSE 119*  BUN 18  CREATININE 0.78  CALCIUM  7.3*  GFRNONAA >60  ANIONGAP 8    No results for input(s): "PROT", "ALBUMIN", "AST", "ALT", "ALKPHOS", "BILITOT" in the last 168 hours. Lipids No results for input(s): "CHOL", "TRIG", "HDL", "LABVLDL", "LDLCALC", "CHOLHDL" in the last 168 hours.  Hematology Recent Labs  Lab 01/21/24 1141  WBC 10.3  RBC 2.85*  HGB 8.7*  HCT 26.4*  MCV 92.6  MCH 30.5  MCHC 33.0  RDW 12.6  PLT 174   Thyroid  No results for input(s): "TSH", "FREET4" in the last 168 hours.  BNPNo results for input(s): "BNP", "PROBNP" in the last 168 hours.  DDimer No results for input(s): "DDIMER" in the last 168 hours.   Radiology/Studies:  CT HEAD WO CONTRAST ( ) Result Date: 01/21/2024 CLINICAL DATA:  Initial evaluation for acute head trauma, abnormal mental status. EXAM: CT HEAD WITHOUT CONTRAST TECHNIQUE: Contiguous axial images were obtained from the base of the skull through the vertex without intravenous  contrast. RADIATION DOSE REDUCTION: This exam was performed according to the departmental dose-optimization program which includes automated exposure control, adjustment of the mA and/or kV according to patient size and/or use of iterative reconstruction technique. COMPARISON:  Prior study from 09/19/2023. FINDINGS: Brain: Cerebral volume within normal limits for patient age. No acute intracranial hemorrhage. No acute large vessel territory infarct. No mass lesion, midline shift, or mass effect. Ventricles are normal in size without hydrocephalus. No extra-axial fluid collection. Vascular: No abnormal hyperdense  vessel.Calcified atherosclerosis present about the skull base. Skull: Scalp soft tissues demonstrate no acute abnormality. Calvarium intact. Sinuses/Orbits: Globes and orbital soft tissues within normal limits. Visualized paranasal sinuses are largely clear. No significant mastoid effusion. Other: None. IMPRESSION: Normal head CT.  No acute intracranial abnormality. Electronically Signed   By: Virgia Griffins M.D.   On: 01/21/2024 22:52   CT Angio Chest/Abd/Pel for Dissection W and/or Wo Contrast Result Date: 01/21/2024 CLINICAL DATA:  Large left posterior chest mass. Fell. On blood thinners. Chest CT dated 10/28/2020 EXAM: CT ANGIOGRAPHY CHEST, ABDOMEN AND PELVIS TECHNIQUE: Non-contrast CT of the chest was initially obtained. Multidetector CT imaging through the chest, abdomen and pelvis was performed using the standard protocol during bolus administration of intravenous contrast. Multiplanar reconstructed images and MIPs were obtained and reviewed to evaluate the vascular anatomy. RADIATION DOSE REDUCTION: This exam was performed according to the departmental dose-optimization program which includes automated exposure control, adjustment of the mA and/or kV according to patient size and/or use of iterative reconstruction technique. CONTRAST:  OMNIPAQUE  IOHEXOL  350 MG/ML SOLN COMPARISON:  Chest CT dated 10/28/2020. FINDINGS: CTA CHEST FINDINGS Cardiovascular: Atheromatous calcifications, including the coronary arteries and aorta. Normally opacified pulmonary arteries with no pulmonary arterial filling defects seen. No thoracic aortic aneurysm or dissection. No active contrast extravasation. Small amount of pleural fluid. Mediastinum/Nodes: No enlarged mediastinal, hilar, or axillary lymph nodes. Thyroid  gland, trachea, and esophagus demonstrate no significant findings. Lungs/Pleura: Lungs are clear. No pleural effusion or pneumothorax. Musculoskeletal: Old, healed right rib fractures. No acute  fractures. Left shoulder prosthesis. Mildly heterogeneous medium to higher density mass with some adjacent soft tissue stranding beneath the left serratus anterior and latissimus dorsi muscles with an appearance compatible with a hematoma. No active contrast extravasation. This measures 7.3 x 4.1 cm on image number 46/4 and 25.5 cm in length on coronal image number 49/12. Review of the MIP images confirms the above findings. CTA ABDOMEN AND PELVIS FINDINGS VASCULAR Aorta: Extensive atheromatous calcifications without aneurysm or dissection. Celiac: Patent without evidence of aneurysm, dissection, vasculitis or significant stenosis. SMA: Calcified plaque at the origin producing approximately 20% luminal narrowing. Renals: Calcified and noncalcified plaque producing approximately 30% luminal stenosis involving the proximal left renal artery. No significant right renal artery stenosis. IMA: Patent without evidence of aneurysm, dissection, vasculitis or significant stenosis. Inflow: Calcified and noncalcified plaque without significant luminal stenosis. Veins: No obvious venous abnormality within the limitations of this arterial phase study. Review of the MIP images confirms the above findings. NON-VASCULAR Hepatobiliary: No focal liver abnormality is seen. Status post cholecystectomy. No biliary dilatation. Pancreas: Unremarkable. No pancreatic ductal dilatation or surrounding inflammatory changes. Spleen: Normal in size without focal abnormality. Adrenals/Urinary Tract: No significant change in mildly heterogeneous, medium density left adrenal nodule measuring 1.9 x 1.2 cm on image number 147/7. On the precontrast images, this measures 19 Hounsfield units in density. Unremarkable kidneys, ureters and urinary bladder. Stomach/Bowel: Unremarkable stomach, small bowel and colon. The appendix is not visualized. No secondary  signs of appendicitis. Lymphatic: No enlarged lymph nodes. Reproductive: Mildly to moderately  enlarged prostate gland. Other: No abdominal wall hernia or abnormality. No abdominopelvic ascites. Musculoskeletal: Mild lumbar spine degenerative changes. No fractures, subluxations or dislocations. Review of the MIP images confirms the above findings. IMPRESSION: 1. 25.5 x 7.3 x 4.1 cm hematoma beneath the left serratus anterior and latissimus dorsi muscles. No active contrast extravasation. 2. No acute fractures. 3. Small left pleural effusion. 4. Extensive atherosclerotic calcifications, including the coronary arteries. 5. No acute abnormality in the abdomen or pelvis. 6. Mildly to moderately enlarged prostate gland. 7. Stable 1.9 x 1.2 cm left adrenal nodule, most likely an adenoma based on the long-term stability. This does not require imaging follow-up. Electronically Signed   By: Catherin Closs M.D.   On: 01/21/2024 15:21   DG Chest Portable 1 View Result Date: 01/21/2024 CLINICAL DATA:  Fall, left chest wall injury with swelling, anticoagulated EXAM: PORTABLE CHEST 1 VIEW COMPARISON:  06/24/2021 FINDINGS: Mild hyperinflation suggesting component of emphysema. Normal heart size and vascularity. No focal airspace process, collapse or consolidation. Negative for edema, large effusion or pneumothorax. Degenerative changes of the spine with associated levoscoliosis. Aorta atherosclerotic. Removed left shoulder arthroplasty changes. Left lateral chest wall soft tissue asymmetry correlates with soft tissue bruising by comparison chest CT also from today. IMPRESSION: 1. Emphysema. 2. No acute chest process. Electronically Signed   By: Melven Stable.  Shick M.D.   On: 01/21/2024 13:30     Assessment and Plan:   [name] is a 63 y.o. male with a hx of moderate nonobstructive CAD on cath in 2019, hyperlipidemia, hypertension, orthostatic hypotension, aortic atherosclerosis, history of tobacco abuse, bipolar, Barrett's esophagus, Graves disease who is being seen 01/22/2024 for the evaluation of syncope at the request  of Lesa Rape MD.  Syncope Loss of consciousness The patient presented to the ER on 01/21/24 via ems services following a syncopal collapse. The patient was reportedly got up at 5 am to go to the bathroom. He reported that he sat on the toilet and used the bathroom. After doing this he stood back up and became lightheaded and fell. He was able to get up off the floor and go to bed. He then got up again this morning to urinate and after standing up from his bed felt light headed and collapsed again. Was down for an unknown period of time. He has been hospitalized for syncope in the past. Has has positive orthostatic vital signs at [facility] in the past. Last had a syncopal event on 05/2023. Wore a zio monitor for 13 days on 07/2023 during this time reported lightheadedness while in normal sinus rhythm. -- will discuss with MD if cardiac monitor is necessary with monitor done on 07/2023  -- orthostatic vital signs were negative (122/84 laying down, 115/87 standing). These were done after patient received a unit of blood. -- The patients story sounds convincing for orthostatic hypotension but orthstatic vitals are negative. polypharmacy (including temazepam , seroquel , oxycodone , and doxazosin ) and anemia may have also contributed to increased risk of fall -- Echo pending -- Head CT negative. -- encourage to increase fluid intake  moderate stenosis of the left ICA and stenosis of the left vertebral artery hematoma -- Saw neurology at [facility] on 10/2023 and was started on plavix 75mg  daily in addition to aspirin  81 mg daily the patient was previously on. -- primary held aspirin  and plavix because of the hematoma -- 25.5 x 7.3 x 4.1 cm hematoma beneath  the left serratus anterior from falling on trash can  Normocytic Anemia hemoglobin initially was 8.7 and patient received a unit of blood hemoglobin 9.7 today. Baseline hemoglobin is about 13. -- denies hematouria, melena, hematochezia. -- Anemia may be  secondary to hematoma but may consider FOBT to evaluate for GI bleed.  Moderate nonobstructive CAD HLD Aortic atherosclerosis Seen on cath in 2019 Troponin negative. Denies chest pain and shortness of breath Continue lipitor 80mg  daily. Okay to hold aspirin .  Hypertension -- PTA was on lisinopril  10mg  daily, labetalol  300mg  BID. -- Hold lisinopril  and labetalol  as blood pressure is normotensive on current medications. -- consider less aggressive blood pressure management and follow up to see if this helps symptoms.   Risk Assessment/Risk Scores:    For questions or updates, please contact [facility] Please consult [email or web address] for contact info under    Signed, Melita Springer, PA-C  01/22/2024 11:41 AM   Patient seen, examined. Available data reviewed. Agree with findings, assessment, and plan as outlined by Melita Springer, PA-C.  The patient is independently interviewed and examined.  He is alert, oriented, in no distress.  HEENT is normal, JVP is normal, lungs are clear bilaterally, heart is regular rate and rhythm 2/6 systolic murmur at the left lower sternal border, abdomen is soft and nontender, extremities have no edema, skin is warm and dry without rash.  As part of his evaluation today, I reviewed outpatient records, labs, vital signs from the hospital, and personally reviewed his echo images.  The patient has a history of syncope in the past.  He has been noted to have cerebrovascular disease and was started on clopidogrel.  The patient has lost some weight recently and states that his blood pressure has been running low at home.  He had frank syncope that occurred when getting up from bed and then had a recurrent event that same day.  Both seem to be associated with typical clinical history of postural hypotension.  I have a very low suspicion for any cardiac arrhythmia as his telemetry monitor shows normal sinus rhythm.  He has no apparent structural heart disease.  Monitoring  in the past has demonstrated benign findings with no significant arrhythmia.  The patient's echo is personally reviewed and shows normal EF of 55 to 60%.  Normal RV size and function, and mild aortic stenosis.  I think the most prudent approach is to hold his antihypertensive medications altogether.  His blood pressure is normal here in the hospital off of his medicines.  These may need to be resumed in the future at lower dose if he becomes hypertensive, but this can be followed as an outpatient.  I also emphasized the importance of adequate fluid hydration.  He has improved in that area somewhat, but is still only drinking 1 large bottle of water per day.  I do not think he requires any further cardiac workup at this time.  The patient appears stable from a cardiac perspective and we do not plan any further inpatient testing.  Will arrange outpatient cardiology follow-up.  Thank you  Arnoldo Lapping, M.D. 01/22/2024 5:28 PM

## 2024-01-22 NOTE — Progress Notes (Signed)
 Discharge instructions completed with pt and family.  20g IV removed from left antecubital with tip intact.  Telemetry monitor removed.  All questions answered.  Pt taken downstairs to private vehicle via wheelchair.  Discharged home in stable condition with all belongings.

## 2024-01-22 NOTE — Discharge Summary (Signed)
 Physician Discharge Summary  [name] ZOX:[id number] DOB: 12-11-1960 DOA: 01/21/2024  PCP: Benedetta Bradley, MD  Admit date: 01/21/2024 Discharge date: 01/22/2024 Recommendations for Outpatient Follow-up:  Follow up with PCP in 1 weeks-call for appointment Please obtain BMP/CBC in one week Please follow-up with PCP to repeat hemoglobin check  Discharge Dispo: Home Discharge Condition: Stable Code Status:   Code Status: Full Code Diet recommendation:  Diet Order             Diet Heart Room service appropriate? Yes; Fluid consistency: Thin  Diet effective now                    Brief/Interim Summary: 63 y.o. male with medical history significant of hypertension, hyperlipidemia, CAD hypothyroidism, GERD presents after having two falls in the past 24 hours PTA. Around 2 am he was attempting to go urinate and he fell,feeling lightheaded and dizzy prior to waking up on the floor with his head in a trash can and second episode occurred after getting up on 5/19 morning against the bathroom.  He is unclear the time of loss of consciousness.  Patient was reporting large hematoma on the left side of his upper back before the fall, suspects he may have pulled a muscle. In the WJ:[id number], blood pressure 95/49-131/67, on RA Labs> hemoglobin 8.7, CO2 20, BUN 18, creatinine 0.78, and calcium  7.3.  CXR>emphysema without acute process. CT angiogramchest abdomen and pelvis>>revealed a 25.5 x 7.3 x 4.1 cm hematoma beneath the left serratus anterior and latissimus dorsi muscles.  Patient was typed and screened and ordered 1 unit of packed red blood cells and admitted Serial troponins negative.  Patient was admitted to monitored overnight. Repeat hemoglobin has improved Seen by cardiology and cleared for discharge home  Subjective: Seen this am Feels well Overnight patient is afebrile BP stable on room air  Discharge diagnosis Syncope Falls x 2 at home: Suspect orthostatic. CT head  negative, serial troponins are negative.  Antihypertensive held on admission.tele monitored overnight. Getting a limited echo. Orthostatic vitals negative currently. Will consult cardiology, may need Op monitor. He has had previous syncope orthostatic hypotension and previous admission had Zio monitor x 13 days in 07/2023.  Cardiology advised to hold antihypertensive at blood pressure is stable currently without medication.  Cardiology arranging outpatient follow-up, encouraged to hydrate orally.  "Echocardiogram resulted stable as below eft ventricular ejection fraction, by estimation, is 55 to 60%. The left ventricle has normal function. The left ventricle has no regional wall motion abnormalities. Left ventricular diastolic parameters were normal.  2. Right ventricular systolic function is normal. The right ventricular size is normal. Tricuspid regurgitation signal is inadequate for assessing PA pressure.  3. The mitral valve is normal in structure. No evidence of mitral valve regurgitation. No evidence of mitral stenosis.  4. The aortic valve is grossly normal. There is mild calcification of the aortic valve. There is mild thickening of the aortic valve. Aortic valve regurgitation is not visualized. Mild aortic valve stenosis.  5. The inferior vena cava is normal in size with greater than 50% respiratory variability, suggesting right atrial pressure of 3 mmHg."  Hematoma over the back, acute ABLA: CT angio chest abdomen pelvis revealed as above hematoma left serratus anterior and latissimus dorsi muscle. Received 1 unit PRBC.  Follow-up repeat hemoglobin. Asa/Plavix has been held on admission> Per Dr Arlester Ladd "he can start back on ASA anytime. I would just hold plavix-and could start back in a week, as Plavix  was started for intracranial stenosis" advised to follow-up with PCP for recheck in hemoglobin Recent Labs  Lab 01/21/24 1141 01/22/24 1134  HGB 8.7* 9.7*  HCT 26.4* 27.8*     CAD: Had mild to moderate nonobstructive coronary disease on last cath 7 9/19 PTA on aspirin  and Plavix-held on admission.On statin F/B Dr Tamra Falling   Hypothyroidism History of Graves' disease: Continue home Synthroid   HLD: Continue statin    Discharge Exam: Vitals:   01/22/24 1222 01/22/24 1743  BP: 122/77 136/78  Pulse: 65 72  Resp:    Temp: 97.8 F (36.6 C)   SpO2: 97% 97%   General: Pt is alert, awake, not in acute distress Cardiovascular: RRR, S1/S2 +, no rubs, no gallops Respiratory: CTA bilaterally, no wheezing, no rhonchi Abdominal: Soft, NT, ND, bowel sounds + Extremities: no edema, no cyanosis  Discharge Instructions  Discharge Instructions     Discharge instructions   Complete by: As directed    Please call call MD or return to ER for similar or worsening recurring problem that brought you to hospital or if any fever,nausea/vomiting,abdominal pain, uncontrolled pain, chest pain,  shortness of breath or any other alarming symptoms.  Please follow-up your doctor as instructed in a week time and call the office for appointment.  Please avoid alcohol, smoking, or any other illicit substance and maintain healthy habits including taking your regular medications as prescribed.  You were cared for by a hospitalist during your hospital stay. If you have any questions about your discharge medications or the care you received while you were in the hospital after you are discharged, you can call the unit and ask to speak with the hospitalist on call if the hospitalist that took care of you is not available.  Once you are discharged, your primary care physician will handle any further medical issues. Please note that NO REFILLS for any discharge medications will be authorized once you are discharged, as it is imperative that you return to your primary care physician (or establish a relationship with a primary care physician if you do not have one) for your aftercare needs so  that they can reassess your need for medications and monitor your lab values   Increase activity slowly   Complete by: As directed       Allergies as of 01/22/2024       Reactions   Bee Venom Anaphylaxis   Cyclobenzaprine    hypotension        Medication List     PAUSE taking these medications    clopidogrel 75 MG tablet Wait to take this until: Jan 28, 2024 Commonly known as: PLAVIX Take 1 tablet by mouth daily.       STOP taking these medications    doxazosin  4 MG tablet Commonly known as: CARDURA    labetalol  300 MG tablet Commonly known as: NORMODYNE    lisinopril  10 MG tablet Commonly known as: ZESTRIL        TAKE these medications    aspirin  EC 81 MG tablet Take 81 mg by mouth daily. Swallow whole.   atorvastatin  80 MG tablet Commonly known as: LIPITOR Take 1 tablet by mouth at bedtime.   B-12 PO Take 1 capsule by mouth daily.   buPROPion  150 MG 24 hr tablet Commonly known as: WELLBUTRIN  XL Take 450 mg by mouth daily.   diclofenac  Sodium 1 % Gel Commonly known as: VOLTAREN  Apply 1 Application topically 4 (four) times daily as needed (pain).   EpiPen  2-Pak 0.3  mg/0.3 mL Soaj injection Generic drug: EPINEPHrine  Inject 0.3 mg into the muscle as needed for anaphylaxis.   levothyroxine  137 MCG tablet Commonly known as: SYNTHROID  Take 137 mcg by mouth daily before breakfast.   methocarbamol  500 MG tablet Commonly known as: ROBAXIN  Take 1 tablet (500 mg total) by mouth every 6 (six) hours as needed for muscle spasms.   Narcan 4 MG/0.1ML Liqd nasal spray kit Generic drug: naloxone Place 1 spray into the nose daily as needed (for overdose).   nitroGLYCERIN  0.4 MG SL tablet Commonly known as: NITROSTAT  Place 1 tablet (0.4 mg total) under the tongue every 5 (five) minutes as needed for chest pain.   omeprazole 20 MG capsule Commonly known as: PRILOSEC Take 20 mg by mouth daily.   oxyCODONE  15 MG immediate release tablet Commonly known as:  ROXICODONE  Take 30 mg by mouth daily as needed for pain.   QUEtiapine  100 MG tablet Commonly known as: SEROQUEL  Take 300 mg by mouth at bedtime.   temazepam  15 MG capsule Commonly known as: RESTORIL  Take 15 mg by mouth daily.   Vitamin D 50 MCG (2000 UT) tablet Take 2,000 Units by mouth daily.        Follow-up Information     Benedetta Bradley, MD Follow up in 1 week(s).   Specialty: Internal Medicine Contact information: [address] [place] [address] [phone]         Carie Charity, NP Follow up.   Specialty: Cardiology Why: follow up scheduled on 02/13/24 at 10:05 am please arrive 15 minutes early Contact information: [address] [place] [address] [phone]                Allergies  Allergen Reactions   Bee Venom Anaphylaxis   Cyclobenzaprine     hypotension    The results of significant diagnostics from this hospitalization (including imaging, microbiology, ancillary and laboratory) are listed below for reference.    Microbiology: No results found for this or any previous visit (from the past 240 hours).  Procedures/Studies: ECHOCARDIOGRAM COMPLETE Result Date: 01/22/2024    ECHOCARDIOGRAM REPORT   Patient Name:   ALECXIS BALTZELL Date of Exam: 01/22/2024 Medical Rec #:  [id number]       Height:       68.0 in Accession #:    [id number]      Weight:       142.0 lb Date of Birth:  12/24/1960       BSA:          1.767 m Patient Age:    62 years        BP: Patient Gender: M               HR:           62 bpm. Exam Location:  Inpatient Procedure: 2D Echo, Cardiac Doppler and Color Doppler (Both Spectral and Color            Flow Doppler were utilized during procedure). Indications:    syncope  History:        Patient has prior history of Echocardiogram examinations.  Sonographer:    Hersey Lorenzo Referring Phys: [id number] Elzina Devera IMPRESSIONS  1. Left ventricular ejection fraction, by estimation, is 55 to 60%. The  left ventricle has normal function. The left ventricle has no regional wall motion abnormalities. Left ventricular diastolic parameters were normal.  2. Right ventricular systolic function is normal. The right ventricular size is  normal. Tricuspid regurgitation signal is inadequate for assessing PA pressure.  3. The mitral valve is normal in structure. No evidence of mitral valve regurgitation. No evidence of mitral stenosis.  4. The aortic valve is grossly normal. There is mild calcification of the aortic valve. There is mild thickening of the aortic valve. Aortic valve regurgitation is not visualized. Mild aortic valve stenosis.  5. The inferior vena cava is normal in size with greater than 50% respiratory variability, suggesting right atrial pressure of 3 mmHg. FINDINGS  Left Ventricle: Left ventricular ejection fraction, by estimation, is 55 to 60%. The left ventricle has normal function. The left ventricle has no regional wall motion abnormalities. The left ventricular internal cavity size was normal in size. There is  no left ventricular hypertrophy. Left ventricular diastolic parameters were normal. Normal left ventricular filling pressure. Right Ventricle: The right ventricular size is normal. No increase in right ventricular wall thickness. Right ventricular systolic function is normal. Tricuspid regurgitation signal is inadequate for assessing PA pressure. Left Atrium: Left atrial size was normal in size. Right Atrium: Right atrial size was normal in size. Pericardium: There is no evidence of pericardial effusion. Mitral Valve: The mitral valve is normal in structure. No evidence of mitral valve regurgitation. No evidence of mitral valve stenosis. Tricuspid Valve: The tricuspid valve is normal in structure. Tricuspid valve regurgitation is not demonstrated. Aortic Valve: The aortic valve is grossly normal. There is mild calcification of the aortic valve. There is mild thickening of the aortic valve. Aortic  valve regurgitation is not visualized. Mild aortic stenosis is present. Aortic valve mean gradient measures 6.5 mmHg. Aortic valve peak gradient measures 11.6 mmHg. Aortic valve area, by VTI measures 1.20 cm. Pulmonic Valve: The pulmonic valve was not well visualized. Pulmonic valve regurgitation is not visualized. No evidence of pulmonic stenosis. Aorta: The aortic root is normal in size and structure. Venous: The inferior vena cava is normal in size with greater than 50% respiratory variability, suggesting right atrial pressure of 3 mmHg. IAS/Shunts: The interatrial septum was not well visualized.  LEFT VENTRICLE PLAX 2D LVIDd:         4.80 cm   Diastology LVIDs:         3.30 cm   LV e' medial:    9.79 cm/s LV PW:         0.90 cm   LV E/e' medial:  8.3 LV IVS:        0.90 cm   LV e' lateral:   10.90 cm/s LVOT diam:     1.70 cm   LV E/e' lateral: 7.5 LV SV:         44 LV SV Index:   25 LVOT Area:     2.27 cm  RIGHT VENTRICLE             IVC RV S prime:     13.80 cm/s  IVC diam: 1.60 cm TAPSE (M-mode): 2.1 cm LEFT ATRIUM             Index LA diam:        3.80 cm 2.15 cm/m LA Vol (A2C):   33.1 ml 18.73 ml/m LA Vol (A4C):   40.9 ml 23.15 ml/m LA Biplane Vol: 37.2 ml 21.05 ml/m  AORTIC VALVE AV Area (Vmax):    1.20 cm AV Area (Vmean):   1.19 cm AV Area (VTI):     1.20 cm AV Vmax:           170.25 cm/s  AV Vmean:          117.000 cm/s AV VTI:            0.372 m AV Peak Grad:      11.6 mmHg AV Mean Grad:      6.5 mmHg LVOT Vmax:         90.30 cm/s LVOT Vmean:        61.500 cm/s LVOT VTI:          0.196 m LVOT/AV VTI ratio: 0.53  AORTA Ao Root diam: 2.50 cm MITRAL VALVE MV Area (PHT): 3.60 cm    SHUNTS MV Decel Time: 211 msec    Systemic VTI:  0.20 m MV E velocity: 81.40 cm/s  Systemic Diam: 1.70 cm MV A velocity: 84.20 cm/s MV E/A ratio:  0.97 Mihai Croitoru MD Electronically signed by Luana Rumple MD Signature Date/Time: 01/22/2024/5:18:58 PM    Final    CT HEAD WO CONTRAST ( ) Result Date:  01/21/2024 CLINICAL DATA:  Initial evaluation for acute head trauma, abnormal mental status. EXAM: CT HEAD WITHOUT CONTRAST TECHNIQUE: Contiguous axial images were obtained from the base of the skull through the vertex without intravenous contrast. RADIATION DOSE REDUCTION: This exam was performed according to the departmental dose-optimization program which includes automated exposure control, adjustment of the mA and/or kV according to patient size and/or use of iterative reconstruction technique. COMPARISON:  Prior study from 09/19/2023. FINDINGS: Brain: Cerebral volume within normal limits for patient age. No acute intracranial hemorrhage. No acute large vessel territory infarct. No mass lesion, midline shift, or mass effect. Ventricles are normal in size without hydrocephalus. No extra-axial fluid collection. Vascular: No abnormal hyperdense vessel.Calcified atherosclerosis present about the skull base. Skull: Scalp soft tissues demonstrate no acute abnormality. Calvarium intact. Sinuses/Orbits: Globes and orbital soft tissues within normal limits. Visualized paranasal sinuses are largely clear. No significant mastoid effusion. Other: None. IMPRESSION: Normal head CT.  No acute intracranial abnormality. Electronically Signed   By: Virgia Griffins M.D.   On: 01/21/2024 22:52   CT Angio Chest/Abd/Pel for Dissection W and/or Wo Contrast Result Date: 01/21/2024 CLINICAL DATA:  Large left posterior chest mass. Fell. On blood thinners. Chest CT dated 10/28/2020 EXAM: CT ANGIOGRAPHY CHEST, ABDOMEN AND PELVIS TECHNIQUE: Non-contrast CT of the chest was initially obtained. Multidetector CT imaging through the chest, abdomen and pelvis was performed using the standard protocol during bolus administration of intravenous contrast. Multiplanar reconstructed images and MIPs were obtained and reviewed to evaluate the vascular anatomy. RADIATION DOSE REDUCTION: This exam was performed according to the departmental  dose-optimization program which includes automated exposure control, adjustment of the mA and/or kV according to patient size and/or use of iterative reconstruction technique. CONTRAST:  OMNIPAQUE  IOHEXOL  350 MG/ML SOLN COMPARISON:  Chest CT dated 10/28/2020. FINDINGS: CTA CHEST FINDINGS Cardiovascular: Atheromatous calcifications, including the coronary arteries and aorta. Normally opacified pulmonary arteries with no pulmonary arterial filling defects seen. No thoracic aortic aneurysm or dissection. No active contrast extravasation. Small amount of pleural fluid. Mediastinum/Nodes: No enlarged mediastinal, hilar, or axillary lymph nodes. Thyroid  gland, trachea, and esophagus demonstrate no significant findings. Lungs/Pleura: Lungs are clear. No pleural effusion or pneumothorax. Musculoskeletal: Old, healed right rib fractures. No acute fractures. Left shoulder prosthesis. Mildly heterogeneous medium to higher density mass with some adjacent soft tissue stranding beneath the left serratus anterior and latissimus dorsi muscles with an appearance compatible with a hematoma. No active contrast extravasation. This measures 7.3 x 4.1 cm on image number 46/4 and 25.5  cm in length on coronal image number 49/12. Review of the MIP images confirms the above findings. CTA ABDOMEN AND PELVIS FINDINGS VASCULAR Aorta: Extensive atheromatous calcifications without aneurysm or dissection. Celiac: Patent without evidence of aneurysm, dissection, vasculitis or significant stenosis. SMA: Calcified plaque at the origin producing approximately 20% luminal narrowing. Renals: Calcified and noncalcified plaque producing approximately 30% luminal stenosis involving the proximal left renal artery. No significant right renal artery stenosis. IMA: Patent without evidence of aneurysm, dissection, vasculitis or significant stenosis. Inflow: Calcified and noncalcified plaque without significant luminal stenosis. Veins: No obvious venous  abnormality within the limitations of this arterial phase study. Review of the MIP images confirms the above findings. NON-VASCULAR Hepatobiliary: No focal liver abnormality is seen. Status post cholecystectomy. No biliary dilatation. Pancreas: Unremarkable. No pancreatic ductal dilatation or surrounding inflammatory changes. Spleen: Normal in size without focal abnormality. Adrenals/Urinary Tract: No significant change in mildly heterogeneous, medium density left adrenal nodule measuring 1.9 x 1.2 cm on image number 147/7. On the precontrast images, this measures 19 Hounsfield units in density. Unremarkable kidneys, ureters and urinary bladder. Stomach/Bowel: Unremarkable stomach, small bowel and colon. The appendix is not visualized. No secondary signs of appendicitis. Lymphatic: No enlarged lymph nodes. Reproductive: Mildly to moderately enlarged prostate gland. Other: No abdominal wall hernia or abnormality. No abdominopelvic ascites. Musculoskeletal: Mild lumbar spine degenerative changes. No fractures, subluxations or dislocations. Review of the MIP images confirms the above findings. IMPRESSION: 1. 25.5 x 7.3 x 4.1 cm hematoma beneath the left serratus anterior and latissimus dorsi muscles. No active contrast extravasation. 2. No acute fractures. 3. Small left pleural effusion. 4. Extensive atherosclerotic calcifications, including the coronary arteries. 5. No acute abnormality in the abdomen or pelvis. 6. Mildly to moderately enlarged prostate gland. 7. Stable 1.9 x 1.2 cm left adrenal nodule, most likely an adenoma based on the long-term stability. This does not require imaging follow-up. Electronically Signed   By: Catherin Closs M.D.   On: 01/21/2024 15:21   DG Chest Portable 1 View Result Date: 01/21/2024 CLINICAL DATA:  Fall, left chest wall injury with swelling, anticoagulated EXAM: PORTABLE CHEST 1 VIEW COMPARISON:  06/24/2021 FINDINGS: Mild hyperinflation suggesting component of emphysema. Normal  heart size and vascularity. No focal airspace process, collapse or consolidation. Negative for edema, large effusion or pneumothorax. Degenerative changes of the spine with associated levoscoliosis. Aorta atherosclerotic. Removed left shoulder arthroplasty changes. Left lateral chest wall soft tissue asymmetry correlates with soft tissue bruising by comparison chest CT also from today. IMPRESSION: 1. Emphysema. 2. No acute chest process. Electronically Signed   By: Melven Stable.  Shick M.D.   On: 01/21/2024 13:30    Labs: BNP (last 3 results) No results for input(s): "BNP" in the last 8760 hours. Basic Metabolic Panel: Recent Labs  Lab 01/21/24 1141  NA 141  K 3.6  CL 113*  CO2 20*  GLUCOSE 119*  BUN 18  CREATININE 0.78  CALCIUM  7.3*  CBC: Recent Labs  Lab 01/21/24 1141 01/22/24 1134  WBC 10.3 7.6  NEUTROABS 8.6*  --   HGB 8.7* 9.7*  HCT 26.4* 27.8*  MCV 92.6 89.4  PLT 174 160  Anemia work up No results for input(s): "VITAMINB12", "FOLATE", "FERRITIN", "TIBC", "IRON", "RETICCTPCT" in the last 72 hours. Urinalysis    Component Value Date/Time   COLORURINE YELLOW 10/04/2011 0033   APPEARANCEUR CLEAR 10/04/2011 0033   LABSPEC 1.014 10/04/2011 0033   PHURINE 6.5 10/04/2011 0033   GLUCOSEU NEGATIVE 10/04/2011 0033   HGBUR TRACE (A)  10/04/2011 0033   BILIRUBINUR NEGATIVE 10/04/2011 0033   KETONESUR NEGATIVE 10/04/2011 0033   PROTEINUR NEGATIVE 10/04/2011 0033   UROBILINOGEN 0.2 10/04/2011 0033   NITRITE NEGATIVE 10/04/2011 0033   LEUKOCYTESUR NEGATIVE 10/04/2011 0033   Sepsis Labs Recent Labs  Lab 01/21/24 1141 01/22/24 1134  WBC 10.3 7.6   Microbiology No results found for this or any previous visit (from the past 240 hours).  Time coordinating discharge: 25 minutes  SIGNED: Lesa Rape, MD  [facility] 01/22/2024, 5:45 PM  If 7PM-7AM, please contact night-coverage [email or web address]

## 2024-01-22 NOTE — Plan of Care (Signed)

## 2024-01-22 NOTE — Evaluation (Signed)
 Physical Therapy Evaluation Patient Details Name: [name] MRN: [id number] DOB: [birth year] Today's Date: 01/22/2024  History of Present Illness  63 yo male admitted 01/21/24 with 2 falls and anemia. Pt with Left back hematoma noted prior to falls. PMhx: HTN, HLD, CAD, Lt reverse TSA, graves disease, bipolar disorder  Clinical Impression  Pt pleasant and stating he is normally independent and assists mom at home. Pt able to walk with CGA due to slight instability with gait and educated for higher level balance deficits and need for assist. Pt reports 2 falls and concerned about his neck and prior fusion. Pt will benefit from acute therapy to maximize mobility and safety, encouraged gait with assist. Pt has all needed DME.    Orthostatic BPs  Supine 122/84 (96), HR 75  Sitting 127/83 (97), HR 88     Standing 117/82 (95), HR 95  Standing after 3 min 115/87 (93), HR 97         If plan is discharge home, recommend the following:     Can travel by private vehicle        Equipment Recommendations None recommended by PT  Recommendations for Other Services       Functional Status Assessment Patient has had a recent decline in their functional status and demonstrates the ability to make significant improvements in function in a reasonable and predictable amount of time.     Precautions / Restrictions Precautions Precautions: Fall      Mobility  Bed Mobility Overal bed mobility: Modified Independent                  Transfers Overall transfer level: Modified independent                      Ambulation/Gait Ambulation/Gait assistance: Contact guard assist Gait Distance (Feet): 450 Feet Assistive device: None Gait Pattern/deviations: Step-through pattern, Decreased stride length, Decreased stance time - left   Gait velocity interpretation: 1.31 - 2.62 ft/sec, indicative of limited community ambulator   General Gait Details: pt with slightly unsteady  gait and decreased stance on LLE with CGA for safety  Stairs Stairs: Yes Stairs assistance: Modified independent (Device/Increase time) Stair Management: One rail Right, Alternating pattern, Forwards Number of Stairs: 5    Wheelchair Mobility     Tilt Bed    Modified Rankin (Stroke Patients Only)       Balance Overall balance assessment: Needs assistance   Sitting balance-Leahy Scale: Good       Standing balance-Leahy Scale: Good   Single Leg Stance - Right Leg: 8 Single Leg Stance - Left Leg: 9 Tandem Stance - Right Leg: 20 Tandem Stance - Left Leg: 20 Rhomberg - Eyes Opened: 60 Rhomberg - Eyes Closed: 20   High Level Balance Comments: pt with increased sway with rhomberg eyes closed, initial single limb stance with LOB but on 2nd attempt able to hold             Pertinent Vitals/Pain Pain Assessment Pain Assessment: No/denies pain    Home Living Family/patient expects to be discharged to:: Private residence Living Arrangements: Parent Available Help at Discharge: Family Type of Home: House Home Access: Stairs to enter Entrance Stairs-Rails: Left Entrance Stairs-Number of Steps: 4   Home Layout: One level Home Equipment: Agricultural consultant (2 wheels);Rollator (4 wheels);Shower seat;Grab bars - tub/shower;Jeananne Mighty - single point Additional Comments: enjoys fishing and disc golf    Prior Function Prior Level of Function : Independent/Modified Independent;Driving  ADLs Comments: cares for his mom     Extremity/Trunk Assessment   Upper Extremity Assessment Upper Extremity Assessment: Overall WFL for tasks assessed    Lower Extremity Assessment Lower Extremity Assessment: Overall WFL for tasks assessed    Cervical / Trunk Assessment Cervical / Trunk Assessment: Normal  Communication   Communication Communication: No apparent difficulties    Cognition Arousal: Alert Behavior During Therapy: WFL for tasks assessed/performed   PT  - Cognitive impairments: No apparent impairments                         Following commands: Intact       Cueing Cueing Techniques: Verbal cues     General Comments      Exercises     Assessment/Plan    PT Assessment Patient needs continued PT services  PT Problem List Decreased activity tolerance;Decreased balance       PT Treatment Interventions Therapeutic activities;Patient/family education;Functional mobility training;Balance training;Gait training    PT Goals (Current goals can be found in the Care Plan section)  Acute Rehab PT Goals Patient Stated Goal: return to fishing and disc golf PT Goal Formulation: With patient Time For Goal Achievement: 02/05/24 Potential to Achieve Goals: Good    Frequency Min 2X/week     Co-evaluation               AM-PAC PT "6 Clicks" Mobility  Outcome Measure Help needed turning from your back to your side while in a flat bed without using bedrails?: None Help needed moving from lying on your back to sitting on the side of a flat bed without using bedrails?: None Help needed moving to and from a bed to a chair (including a wheelchair)?: None Help needed standing up from a chair using your arms (e.g., wheelchair or bedside chair)?: A Little Help needed to walk in hospital room?: A Little Help needed climbing 3-5 steps with a railing? : None 6 Click Score: 22    End of Session   Activity Tolerance: Patient tolerated treatment well Patient left: in chair;with call bell/phone within reach;with chair alarm set Nurse Communication: Mobility status PT Visit Diagnosis: Other abnormalities of gait and mobility (R26.89)    Time: 1610-9604 PT Time Calculation (min) (ACUTE ONLY): 20 min   Charges:   PT Evaluation $PT Eval Low Complexity: 1 Low   PT General Charges $$ ACUTE PT VISIT: 1 Visit         Annis Baseman, PT Acute Rehabilitation Services Office: [phone]   Luwana Salvo 01/22/2024, 9:43 AM

## 2024-01-22 NOTE — Progress Notes (Signed)
  Echocardiogram 2D Echocardiogram has been performed.  Fain Home RDCS 01/22/2024, 3:21 PM

## 2024-01-23 LAB — MISC LABCORP TEST (SEND OUT): Labcorp test code: 83935

## 2024-01-30 ENCOUNTER — Other Ambulatory Visit: Payer: Self-pay | Admitting: Cardiology

## 2024-01-30 DIAGNOSIS — I951 Orthostatic hypotension: Secondary | ICD-10-CM | POA: Diagnosis not present

## 2024-01-30 DIAGNOSIS — T148XXA Other injury of unspecified body region, initial encounter: Secondary | ICD-10-CM | POA: Diagnosis not present

## 2024-01-30 DIAGNOSIS — R58 Hemorrhage, not elsewhere classified: Secondary | ICD-10-CM | POA: Diagnosis not present

## 2024-01-30 DIAGNOSIS — W19XXXA Unspecified fall, initial encounter: Secondary | ICD-10-CM | POA: Diagnosis not present

## 2024-01-30 DIAGNOSIS — D649 Anemia, unspecified: Secondary | ICD-10-CM | POA: Diagnosis not present

## 2024-01-30 DIAGNOSIS — I2510 Atherosclerotic heart disease of native coronary artery without angina pectoris: Secondary | ICD-10-CM | POA: Diagnosis not present

## 2024-01-30 DIAGNOSIS — I10 Essential (primary) hypertension: Secondary | ICD-10-CM | POA: Diagnosis not present

## 2024-02-01 DIAGNOSIS — R58 Hemorrhage, not elsewhere classified: Secondary | ICD-10-CM | POA: Diagnosis not present

## 2024-02-01 DIAGNOSIS — I6523 Occlusion and stenosis of bilateral carotid arteries: Secondary | ICD-10-CM | POA: Diagnosis not present

## 2024-02-01 DIAGNOSIS — I6502 Occlusion and stenosis of left vertebral artery: Secondary | ICD-10-CM | POA: Diagnosis not present

## 2024-02-01 DIAGNOSIS — T148XXA Other injury of unspecified body region, initial encounter: Secondary | ICD-10-CM | POA: Diagnosis not present

## 2024-02-01 DIAGNOSIS — I669 Occlusion and stenosis of unspecified cerebral artery: Secondary | ICD-10-CM | POA: Diagnosis not present

## 2024-02-01 DIAGNOSIS — W19XXXA Unspecified fall, initial encounter: Secondary | ICD-10-CM | POA: Diagnosis not present

## 2024-02-01 DIAGNOSIS — I10 Essential (primary) hypertension: Secondary | ICD-10-CM | POA: Diagnosis not present

## 2024-02-01 DIAGNOSIS — I951 Orthostatic hypotension: Secondary | ICD-10-CM | POA: Diagnosis not present

## 2024-02-01 DIAGNOSIS — D649 Anemia, unspecified: Secondary | ICD-10-CM | POA: Diagnosis not present

## 2024-02-02 DIAGNOSIS — J432 Centrilobular emphysema: Secondary | ICD-10-CM | POA: Diagnosis not present

## 2024-02-02 DIAGNOSIS — I2510 Atherosclerotic heart disease of native coronary artery without angina pectoris: Secondary | ICD-10-CM | POA: Diagnosis not present

## 2024-02-02 DIAGNOSIS — M4712 Other spondylosis with myelopathy, cervical region: Secondary | ICD-10-CM | POA: Diagnosis not present

## 2024-02-08 DIAGNOSIS — I6502 Occlusion and stenosis of left vertebral artery: Secondary | ICD-10-CM | POA: Diagnosis not present

## 2024-02-08 DIAGNOSIS — D500 Iron deficiency anemia secondary to blood loss (chronic): Secondary | ICD-10-CM | POA: Diagnosis not present

## 2024-02-08 DIAGNOSIS — M542 Cervicalgia: Secondary | ICD-10-CM | POA: Diagnosis not present

## 2024-02-08 DIAGNOSIS — R519 Headache, unspecified: Secondary | ICD-10-CM | POA: Diagnosis not present

## 2024-02-08 DIAGNOSIS — W19XXXS Unspecified fall, sequela: Secondary | ICD-10-CM | POA: Diagnosis not present

## 2024-02-08 DIAGNOSIS — I669 Occlusion and stenosis of unspecified cerebral artery: Secondary | ICD-10-CM | POA: Diagnosis not present

## 2024-02-10 NOTE — Progress Notes (Unsigned)
 Cardiology Clinic Note   Patient Name: [name] Date of Encounter: 02/10/2024  Primary Care Provider:  Benedetta Bradley, MD Primary Cardiologist:  Olinda Bertrand, DO  Patient Profile    [name] 63 year old male presents to the clinic today for follow-up evaluation of his syncope and collapse.  Past Medical History    Past Medical History:  Diagnosis Date   Anxiety    Arthritis    neck and knee   Barrett's esophagus    Bipolar 1 disorder (HCC)    CAD in native artery    Cervical disc syndrome    Depression    GERD (gastroesophageal reflux disease)    Grave's disease    Graves' disease 10/04/2011   H/O prostatitis    Headache    Hiatal hernia    Hypertension    Hypothyroidism    Lumbar degenerative disc disease    Mild carotid artery disease (HCC)    Mixed hyperlipidemia 12/27/2017   Myocardial infarction (HCC) 2000   Mild heart attack   Stenosis of left vertebrobasilar artery    Stenosis of right subclavian artery (HCC)    Tobacco abuse 12/27/2017   Past Surgical History:  Procedure Laterality Date   ANTERIOR CERVICAL DECOMP/DISCECTOMY FUSION N/A 07/13/2021   Procedure: ANTERIOR CERVICAL DECOMPRESSION/DISCECTOMY FUSION CERVICAL THREE-FOUR, CERVICAL FOUR -FIVE;  Surgeon: Mort Ards, MD;  Location: [facility] OR;  Service: Orthopedics;  Laterality: N/A;  ANTERIOR CERVICAL DECOMPRESSION/DISCECTOMY FUSION CERVICAL THREE-FOUR, CERVICAL FOUR -FIVE   CARDIAC CATHETERIZATION  09/04/1998   CHOLECYSTECTOMY  12/11/2013   DR Elvan Hamel    CHOLECYSTECTOMY N/A 12/11/2013   Procedure: LAPAROSCOPIC CHOLECYSTECTOMY WITH INTRAOPERATIVE CHOLANGIOGRAM;  Surgeon: Fran Imus, MD;  Location: [facility] OR;  Service: General;  Laterality: N/A;   IR ANGIO INTRA EXTRACRAN SEL COM CAROTID INNOMINATE BILAT MOD SED  05/02/2018   IR ANGIO VERTEBRAL SEL VERTEBRAL BILAT MOD SED  05/02/2018   KNEE SURGERY     LEFT HEART CATH AND CORONARY ANGIOGRAPHY N/A 03/12/2018   Procedure: LEFT HEART  CATH AND CORONARY ANGIOGRAPHY;  Surgeon: Arty Binning, MD;  Location: [facility] INVASIVE CV LAB;  Service: Cardiovascular;  Laterality: N/A;   REVERSE SHOULDER ARTHROPLASTY Left 04/07/2022   Procedure: REVERSE SHOULDER ARTHROPLASTY;  Surgeon: Winston Hawking, MD;  Location: [facility] ORS;  Service: Orthopedics;  Laterality: Left;  with ISB   testicular torsion surgery     THYROIDECTOMY      Allergies  Allergies  Allergen Reactions   Bee Venom Anaphylaxis   Cyclobenzaprine     hypotension    History of Present Illness    [name] is a PMH of moderate nonobstructive CAD seen on cardiac catheterization 2019, HLD, HTN, orthostatic hypotension, aortic atherosclerosis, history of tobacco abuse, bipolar disorder, Barrett's esophagus, Graves' disease, and syncope.  He underwent LHC on 7/29 for atypical chest pain.  He was noted to have moderate nonobstructive coronary disease with 50-60% in his distal circumflex, 50-60% in his first diagonal, 20-30% proximal-distal RCA and luminal irregularities of his proximal-distal LAD.  Medical management was recommended.  He had a unwitnessed syncopal episode 9/24.  His PCP ordered a cardiac event monitor 11/24.  Monitor showed very short episode of SVT, no ventricular tachycardia, pauses, atrial fibrillation or AV block.  Triggered events x 13 showed sinus rhythm.  Echocardiogram 12/24 showed LVEF of 55-60%, G1 DD, mild aortic calcification, and no other abnormalities.  He was seen in follow-up by Dr. Albert Huff 3/25 and remained stable at that time.  He was seen and evaluated by neurology at [facility] for asymptomatic moderate stenosis of the left ICA and stenosis of his left vertebral artery.  At that time he was noted to have orthostatic hypotension and was symptomatic.  He was started on clopidogrel 75 mg daily.  He was seen and evaluated in the emergency department 5/25 following a syncopal episode.  He reportedly got up to use the bathroom 5 AM.  He sat on the toilet  to use the bathroom.  After doing this he stood back up and became lightheaded and fell.  He was able to get off the floor and go to bed.  After going back to bed he reported vomiting a small amount of clear liquid.  He got up again at 7 AM to urinate and after standing from his bed felt lightheaded and collapsed again.  He did not know the duration he was on the floor before getting back up.  He felt on a trash can in his room and was noted to have a hematoma of his left ribs.  He denied chest pain, shortness of breath, fever, diarrhea, palpitations.  His blood pressure was noted to be 95/49 in the emergency department and improved.  His orthostatic vitals were negative.  Vitals were performed after receiving a unit of blood.  His head CT was negative.  He was encouraged to increase his fluids.  It was felt that polypharmacy contributed to his episode along with anemia.  His echocardiogram 01/22/2024 showed normal LVEF, normal diastolic parameters, and no significant valvular abnormalities.  His hemoglobin improved from 8.7 up to 9.7 after a unit of blood.  Baseline hemoglobin was around 13.  He denied bleeding issues.  Occult stool was recommended to evaluate for GI bleed.  His lisinopril  and labetalol  were held and it was felt that less aggressive blood pressure management may be warranted.  He presents to the clinic today for evaluation and states***.  *** denies chest pain, shortness of breath, lower extremity edema, fatigue, palpitations, melena, hematuria, hemoptysis, diaphoresis, weakness, presyncope, syncope, orthopnea, and PND.  Syncope-denies further episodes of lightheadedness, presyncope or syncope.  Appears to be multifactorial in the setting of polypharmacy and anemia.  Denies palpitations, irregular or accelerated heartbeats.  Previously wore cardiac event monitor which showed very short episode of SVT and no VT, atrial fibrillation or AV block. Maintain p.o. hydration Maintain blood pressure  log Change positions slowly  Coronary artery disease-denies episodes of exertional chest discomfort.  Noted to have nonobstructive disease on cardiac catheterization in 2019. Continue atorvastatin , aspirin , clopidogrel Heart healthy low-sodium diet Increase physical activity as tolerated  Hyperlipidemia-LDL***. Continue atorvastatin , aspirin , clopidogrel High-fiber diet  Essential hypertension-BP today***. Maintain blood pressure log Heart healthy low-sodium diet Restart lisinopril ?***  Normocytic anemia-previously noted to have a hemoglobin of 8.7.  Received 1 unit of blood which elevated his hemoglobin to 9.7.  Occult stool was recommended.  He continues to deny bleeding issues. Following with PCP  Disposition: Follow-up with Dr. Charle Congo or me in 4 months.  Home Medications    Prior to Admission medications   Medication Sig Start Date End Date Taking? Authorizing Provider  aspirin  EC 81 MG tablet Take 81 mg by mouth daily. Swallow whole.    [provider]  atorvastatin  (LIPITOR) 80 MG tablet Take 1 tablet by mouth at bedtime. 10/12/23 10/11/24  [provider]  buPROPion  (WELLBUTRIN  XL) 150 MG 24 hr tablet Take 450 mg by mouth daily.    [provider]  Cholecalciferol  (VITAMIN D) 50 MCG (2000 UT) tablet Take 2,000 Units by mouth daily.    [provider]  clopidogrel (PLAVIX) 75 MG tablet Take 1 tablet by mouth daily. 10/12/23   [provider]  Cyanocobalamin  (B-12 PO) Take 1 capsule by mouth daily.    [provider]  diclofenac  Sodium (VOLTAREN ) 1 % GEL Apply 1 Application topically 4 (four) times daily as needed (pain).    [provider]  EPINEPHrine  (EPIPEN  2-PAK) 0.3 mg/0.3 mL IJ SOAJ injection Inject 0.3 mg into the muscle as needed for anaphylaxis.    [provider]  levothyroxine  (SYNTHROID ) 137 MCG tablet Take 137 mcg by mouth daily before breakfast. 04/01/16   [provider]  methocarbamol   (ROBAXIN ) 500 MG tablet Take 1 tablet (500 mg total) by mouth every 6 (six) hours as needed for muscle spasms. Patient not taking: Reported on 01/21/2024 04/07/22   Winston Hawking, MD  naloxone [facility]) nasal spray 4 mg/0.1 mL Place 1 spray into the nose daily as needed (for overdose). 07/07/23   [provider]  nitroGLYCERIN  (NITROSTAT ) 0.4 MG SL tablet Place 1 tablet (0.4 mg total) under the tongue every 5 (five) minutes as needed for chest pain. 09/27/23   Hugh Madura, MD  omeprazole (PRILOSEC) 20 MG capsule Take 20 mg by mouth daily.    [provider]  oxyCODONE  (ROXICODONE ) 15 MG immediate release tablet Take 30 mg by mouth daily as needed for pain. 01/25/23   [provider]  QUEtiapine  (SEROQUEL ) 100 MG tablet Take 300 mg by mouth at bedtime. 06/06/21   [provider]  temazepam  (RESTORIL ) 15 MG capsule Take 15 mg by mouth daily. 05/24/23   [provider]    Family History    Family History  Problem Relation Age of Onset   Hypertension Mother    Hypertension Father    Valvular heart disease Father    Cancer Other    Hypertension Other    He indicated that his mother is alive. He indicated that his father is alive. He indicated that his maternal grandmother is deceased. He indicated that his maternal grandfather is deceased. He indicated that his paternal grandmother is deceased. He indicated that his paternal grandfather is deceased. He indicated that the status of his other is unknown.  Social History    Social History   Socioeconomic History   Marital status: Divorced    Spouse name: Not on file   Number of children: 1   Years of education: Not on file   Highest education level: Some college, no degree  Occupational History   Not on file  Tobacco Use   Smoking status: Former    Current packs/day: 0.00    Average packs/day: 0.3 packs/day for 35.0 years (8.8 ttl pk-yrs)    Types: Cigarettes    Start date: 05/05/1986    Quit date:  05/05/2021    Years since quitting: 2.7   Smokeless tobacco: Never  Vaping Use   Vaping status: Never Used  Substance and Sexual Activity   Alcohol use: No   Drug use: No   Sexual activity: Never  Other Topics Concern   Not on file  Social History Narrative   Lives at home with his parents    Right handed   Caffeine: about 36 oz of soda daily   Social Drivers of Health   Financial Resource Strain: Not on file  Food Insecurity: No Food Insecurity (01/21/2024)   Hunger Vital Sign  Worried About Programme researcher, broadcasting/film/video in the Last Year: Never true    Ran Out of Food in the Last Year: Never true  Transportation Needs: No Transportation Needs (01/21/2024)   PRAPARE - Administrator, Civil Service (Medical): No    Lack of Transportation (Non-Medical): No  Physical Activity: Not on file  Stress: Not on file  Social Connections: Not on file  Intimate Partner Violence: Not At Risk (01/21/2024)   Humiliation, Afraid, Rape, and Kick questionnaire    Fear of Current or Ex-Partner: No    Emotionally Abused: No    Physically Abused: No    Sexually Abused: No     Review of Systems    General:  No chills, fever, night sweats or weight changes.  Cardiovascular:  No chest pain, dyspnea on exertion, edema, orthopnea, palpitations, paroxysmal nocturnal dyspnea. Dermatological: No rash, lesions/masses Respiratory: No cough, dyspnea Urologic: No hematuria, dysuria Abdominal:   No nausea, vomiting, diarrhea, bright red blood per rectum, melena, or hematemesis Neurologic:  No visual changes, wkns, changes in mental status. All other systems reviewed and are otherwise negative except as noted above.  Physical Exam    VS:  There were no vitals taken for this visit. , BMI There is no height or weight on file to calculate BMI. GEN: Well nourished, well developed, in no acute distress. HEENT: normal. Neck: Supple, no JVD, carotid bruits, or masses. Cardiac: RRR, no murmurs, rubs, or  gallops. No clubbing, cyanosis, edema.  Radials/DP/PT 2+ and equal bilaterally.  Respiratory:  Respirations regular and unlabored, clear to auscultation bilaterally. GI: Soft, nontender, nondistended, BS + x 4. MS: no deformity or atrophy. Skin: warm and dry, no rash. Neuro:  Strength and sensation are intact. Psych: Normal affect.  Accessory Clinical Findings    Recent Labs: 01/21/2024: BUN 18; Creatinine, Ser 0.78; Potassium 3.6; Sodium 141 01/22/2024: Hemoglobin 9.7; Platelets 160   Recent Lipid Panel    Component Value Date/Time   CHOL 134 04/10/2018 0907   TRIG 111 04/10/2018 0907   HDL 53 04/10/2018 0907   CHOLHDL 2.5 04/10/2018 0907   CHOLHDL 7.1 10/04/2011 0545   VLDL 74 (H) 10/04/2011 0545   LDLCALC 59 04/10/2018 0907    No BP recorded.  {Refresh Note OR Click here to enter BP  :1}***    ECG personally reviewed by me today- ***     Echocardiogram 01/22/2024  IMPRESSIONS     1. Left ventricular ejection fraction, by estimation, is 55 to 60%. The  left ventricle has normal function. The left ventricle has no regional  wall motion abnormalities. Left ventricular diastolic parameters were  normal.   2. Right ventricular systolic function is normal. The right ventricular  size is normal. Tricuspid regurgitation signal is inadequate for assessing  PA pressure.   3. The mitral valve is normal in structure. No evidence of mitral valve  regurgitation. No evidence of mitral stenosis.   4. The aortic valve is grossly normal. There is mild calcification of the  aortic valve. There is mild thickening of the aortic valve. Aortic valve  regurgitation is not visualized. Mild aortic valve stenosis.   5. The inferior vena cava is normal in size with greater than 50%  respiratory variability, suggesting right atrial pressure of 3 mmHg.   FINDINGS   Left Ventricle: Left ventricular ejection fraction, by estimation, is 55  to 60%. The left ventricle has normal function. The  left ventricle has no  regional wall motion abnormalities.  The left ventricular internal cavity  size was normal in size. There is   no left ventricular hypertrophy. Left ventricular diastolic parameters  were normal. Normal left ventricular filling pressure.   Right Ventricle: The right ventricular size is normal. No increase in  right ventricular wall thickness. Right ventricular systolic function is  normal. Tricuspid regurgitation signal is inadequate for assessing PA  pressure.   Left [facility]: Left atrial size was normal in size.   Right [facility]: Right atrial size was normal in size.   Pericardium: There is no evidence of pericardial effusion.   Mitral Valve: The mitral valve is normal in structure. No evidence of  mitral valve regurgitation. No evidence of mitral valve stenosis.   Tricuspid Valve: The tricuspid valve is normal in structure. Tricuspid  valve regurgitation is not demonstrated.   Aortic Valve: The aortic valve is grossly normal. There is mild  calcification of the aortic valve. There is mild thickening of the aortic  valve. Aortic valve regurgitation is not visualized. Mild aortic stenosis  is present. Aortic valve mean gradient  measures 6.5 mmHg. Aortic valve peak gradient measures 11.6 mmHg. Aortic  valve area, by VTI measures 1.20 cm.   Pulmonic Valve: The pulmonic valve was not well visualized. Pulmonic valve  regurgitation is not visualized. No evidence of pulmonic stenosis.   Aorta: The aortic root is normal in size and structure.   Venous: The inferior vena cava is normal in size with greater than 50%  respiratory variability, suggesting right atrial pressure of 3 mmHg.   IAS/Shunts: The interatrial septum was not well visualized.       Assessment & Plan   1.  ***   Chet Cota. Visente Kirker NP-C     02/10/2024, 7:08 PM [facility] [address] Office [phone] Fax [phone]    I spent***minutes  examining this patient, reviewing medications, and using patient centered shared decision making involving their cardiac care.   I spent  20 minutes reviewing past medical history,  medications, and prior cardiac tests.

## 2024-02-12 DIAGNOSIS — K760 Fatty (change of) liver, not elsewhere classified: Secondary | ICD-10-CM | POA: Diagnosis not present

## 2024-02-12 DIAGNOSIS — R0789 Other chest pain: Secondary | ICD-10-CM | POA: Diagnosis not present

## 2024-02-12 DIAGNOSIS — E039 Hypothyroidism, unspecified: Secondary | ICD-10-CM | POA: Diagnosis not present

## 2024-02-12 DIAGNOSIS — I669 Occlusion and stenosis of unspecified cerebral artery: Secondary | ICD-10-CM | POA: Diagnosis not present

## 2024-02-12 DIAGNOSIS — I6523 Occlusion and stenosis of bilateral carotid arteries: Secondary | ICD-10-CM | POA: Diagnosis not present

## 2024-02-12 DIAGNOSIS — I10 Essential (primary) hypertension: Secondary | ICD-10-CM | POA: Diagnosis not present

## 2024-02-12 DIAGNOSIS — E781 Pure hyperglyceridemia: Secondary | ICD-10-CM | POA: Diagnosis not present

## 2024-02-12 DIAGNOSIS — I951 Orthostatic hypotension: Secondary | ICD-10-CM | POA: Diagnosis not present

## 2024-02-12 DIAGNOSIS — Z79899 Other long term (current) drug therapy: Secondary | ICD-10-CM | POA: Diagnosis not present

## 2024-02-12 DIAGNOSIS — Z0000 Encounter for general adult medical examination without abnormal findings: Secondary | ICD-10-CM | POA: Diagnosis not present

## 2024-02-12 DIAGNOSIS — R739 Hyperglycemia, unspecified: Secondary | ICD-10-CM | POA: Diagnosis not present

## 2024-02-12 DIAGNOSIS — I6502 Occlusion and stenosis of left vertebral artery: Secondary | ICD-10-CM | POA: Diagnosis not present

## 2024-02-13 ENCOUNTER — Ambulatory Visit: Attending: General Practice | Admitting: General Practice

## 2024-02-13 ENCOUNTER — Encounter: Payer: Self-pay | Admitting: General Practice

## 2024-02-13 VITALS — BP 138/80 | HR 74 | Ht 68.0 in | Wt 141.0 lb

## 2024-02-13 DIAGNOSIS — I10 Essential (primary) hypertension: Secondary | ICD-10-CM | POA: Diagnosis not present

## 2024-02-13 DIAGNOSIS — R931 Abnormal findings on diagnostic imaging of heart and coronary circulation: Secondary | ICD-10-CM | POA: Diagnosis not present

## 2024-02-13 DIAGNOSIS — I2510 Atherosclerotic heart disease of native coronary artery without angina pectoris: Secondary | ICD-10-CM | POA: Diagnosis not present

## 2024-02-13 DIAGNOSIS — R55 Syncope and collapse: Secondary | ICD-10-CM

## 2024-02-13 DIAGNOSIS — E782 Mixed hyperlipidemia: Secondary | ICD-10-CM | POA: Diagnosis not present

## 2024-02-13 DIAGNOSIS — D649 Anemia, unspecified: Secondary | ICD-10-CM | POA: Diagnosis not present

## 2024-02-13 MED ORDER — LISINOPRIL 10 MG PO TABS: 5.0000 mg | ORAL_TABLET | Freq: Every day | ORAL | 2 refills | Status: DC

## 2024-02-13 MED ORDER — LISINOPRIL 10 MG PO TABS: 5.0000 mg | ORAL_TABLET | Freq: Every day | ORAL | 2 refills | Status: AC | PRN

## 2024-02-13 NOTE — Patient Instructions (Signed)
 Medication Instructions:  Your physician has recommended you make the following change in your medication:  As needed: Lisinopril  5 mg is SBP is greater than 150.  *If you need a refill on your cardiac medications before your next appointment, please call your pharmacy*   Follow-Up: At [facility], you and your health needs are our priority.  As part of our continuing mission to provide you with exceptional heart care, our providers are all part of one team.  This team includes your primary Cardiologist (physician) and Advanced Practice Providers or APPs (Physician Assistants and Nurse Practitioners) who all work together to provide you with the care you need, when you need it.  Your next appointment:   3-4 month(s)  Provider:   Lawana Pray, NP        Other instructions:

## 2024-02-18 DIAGNOSIS — M51362 Other intervertebral disc degeneration, lumbar region with discogenic back pain and lower extremity pain: Secondary | ICD-10-CM | POA: Diagnosis not present

## 2024-02-18 DIAGNOSIS — M5412 Radiculopathy, cervical region: Secondary | ICD-10-CM | POA: Diagnosis not present

## 2024-02-18 DIAGNOSIS — D6869 Other thrombophilia: Secondary | ICD-10-CM | POA: Diagnosis not present

## 2024-02-29 ENCOUNTER — Encounter (HOSPITAL_COMMUNITY): Payer: Self-pay | Admitting: Interventional Radiology

## 2024-03-03 DIAGNOSIS — I2510 Atherosclerotic heart disease of native coronary artery without angina pectoris: Secondary | ICD-10-CM | POA: Diagnosis not present

## 2024-03-03 DIAGNOSIS — M4712 Other spondylosis with myelopathy, cervical region: Secondary | ICD-10-CM | POA: Diagnosis not present

## 2024-03-03 DIAGNOSIS — J432 Centrilobular emphysema: Secondary | ICD-10-CM | POA: Diagnosis not present

## 2024-03-21 DIAGNOSIS — R634 Abnormal weight loss: Secondary | ICD-10-CM | POA: Diagnosis not present

## 2024-03-21 DIAGNOSIS — R748 Abnormal levels of other serum enzymes: Secondary | ICD-10-CM | POA: Diagnosis not present

## 2024-04-03 DIAGNOSIS — M4712 Other spondylosis with myelopathy, cervical region: Secondary | ICD-10-CM | POA: Diagnosis not present

## 2024-04-03 DIAGNOSIS — J432 Centrilobular emphysema: Secondary | ICD-10-CM | POA: Diagnosis not present

## 2024-04-03 DIAGNOSIS — I2510 Atherosclerotic heart disease of native coronary artery without angina pectoris: Secondary | ICD-10-CM | POA: Diagnosis not present

## 2024-04-07 DIAGNOSIS — E039 Hypothyroidism, unspecified: Secondary | ICD-10-CM | POA: Diagnosis not present

## 2024-04-10 DIAGNOSIS — M542 Cervicalgia: Secondary | ICD-10-CM | POA: Diagnosis not present

## 2024-04-10 DIAGNOSIS — I6502 Occlusion and stenosis of left vertebral artery: Secondary | ICD-10-CM | POA: Diagnosis not present

## 2024-04-10 DIAGNOSIS — I669 Occlusion and stenosis of unspecified cerebral artery: Secondary | ICD-10-CM | POA: Diagnosis not present

## 2024-04-30 ENCOUNTER — Other Ambulatory Visit: Payer: Self-pay | Admitting: Student

## 2024-04-30 DIAGNOSIS — D649 Anemia, unspecified: Secondary | ICD-10-CM | POA: Diagnosis not present

## 2024-04-30 DIAGNOSIS — K2270 Barrett's esophagus without dysplasia: Secondary | ICD-10-CM | POA: Diagnosis not present

## 2024-04-30 DIAGNOSIS — R634 Abnormal weight loss: Secondary | ICD-10-CM

## 2024-04-30 DIAGNOSIS — Z1211 Encounter for screening for malignant neoplasm of colon: Secondary | ICD-10-CM | POA: Diagnosis not present

## 2024-05-04 DIAGNOSIS — J432 Centrilobular emphysema: Secondary | ICD-10-CM | POA: Diagnosis not present

## 2024-05-04 DIAGNOSIS — I2510 Atherosclerotic heart disease of native coronary artery without angina pectoris: Secondary | ICD-10-CM | POA: Diagnosis not present

## 2024-05-04 DIAGNOSIS — M4712 Other spondylosis with myelopathy, cervical region: Secondary | ICD-10-CM | POA: Diagnosis not present

## 2024-05-06 DIAGNOSIS — D649 Anemia, unspecified: Secondary | ICD-10-CM | POA: Diagnosis not present

## 2024-05-16 DIAGNOSIS — Z1212 Encounter for screening for malignant neoplasm of rectum: Secondary | ICD-10-CM | POA: Diagnosis not present

## 2024-05-20 NOTE — Progress Notes (Unsigned)
 Cardiology Clinic Note   Patient Name: [name] Date of Encounter: 05/27/2024  Primary Care Provider:  Charlott Dorn LABOR, MD Primary Cardiologist:  Madonna Large, DO  Patient Profile    [name] 63 year old male presents to the clinic today for follow-up evaluation of his syncope and collapse.  Past Medical History    Past Medical History:  Diagnosis Date   Anxiety    Arthritis    neck and knee   Barrett's esophagus    Bipolar 1 disorder (HCC)    CAD in native artery    Cervical disc syndrome    Depression    GERD (gastroesophageal reflux disease)    Grave's disease    Graves' disease 10/04/2011   H/O prostatitis    Headache    Hiatal hernia    Hypertension    Hypothyroidism    Lumbar degenerative disc disease    Mild carotid artery disease    Mixed hyperlipidemia 12/27/2017   Myocardial infarction (HCC) 2000   Mild heart attack   Stenosis of left vertebrobasilar artery    Stenosis of right subclavian artery    Tobacco abuse 12/27/2017   Past Surgical History:  Procedure Laterality Date   ANTERIOR CERVICAL DECOMP/DISCECTOMY FUSION N/A 07/13/2021   Procedure: ANTERIOR CERVICAL DECOMPRESSION/DISCECTOMY FUSION CERVICAL THREE-FOUR, CERVICAL FOUR -FIVE;  Surgeon: Burnetta Aures, MD;  Location: [facility] OR;  Service: Orthopedics;  Laterality: N/A;  ANTERIOR CERVICAL DECOMPRESSION/DISCECTOMY FUSION CERVICAL THREE-FOUR, CERVICAL FOUR -FIVE   CARDIAC CATHETERIZATION  09/04/1998   CHOLECYSTECTOMY  12/11/2013   DR TANDA    CHOLECYSTECTOMY N/A 12/11/2013   Procedure: LAPAROSCOPIC CHOLECYSTECTOMY WITH INTRAOPERATIVE CHOLANGIOGRAM;  Surgeon: Camellia CHRISTELLA TANDA, MD;  Location: [facility] OR;  Service: General;  Laterality: N/A;   IR ANGIO INTRA EXTRACRAN SEL COM CAROTID INNOMINATE BILAT MOD SED  05/02/2018   IR ANGIO VERTEBRAL SEL VERTEBRAL BILAT MOD SED  05/02/2018   KNEE SURGERY     LEFT HEART CATH AND CORONARY ANGIOGRAPHY N/A 03/12/2018   Procedure: LEFT HEART CATH AND  CORONARY ANGIOGRAPHY;  Surgeon: Claudene Victory ORN, MD;  Location: [facility] INVASIVE CV LAB;  Service: Cardiovascular;  Laterality: N/A;   REVERSE SHOULDER ARTHROPLASTY Left 04/07/2022   Procedure: REVERSE SHOULDER ARTHROPLASTY;  Surgeon: Kay Kemps, MD;  Location: [facility] ORS;  Service: Orthopedics;  Laterality: Left;  with ISB   testicular torsion surgery     THYROIDECTOMY      Allergies  Allergies  Allergen Reactions   Bee Venom Anaphylaxis   Cyclobenzaprine     hypotension  Other Reaction(s): hypotension    History of Present Illness    [name] is a PMH of moderate nonobstructive CAD seen on cardiac catheterization 2019, HLD, HTN, orthostatic hypotension, aortic atherosclerosis, history of tobacco abuse, bipolar disorder, Barrett's esophagus, Graves' disease, and syncope.  He underwent LHC on 7/19 for atypical chest pain.  He was noted to have moderate nonobstructive coronary disease with 50-60% in his distal circumflex, 50-60% in his first diagonal, 20-30% proximal-distal RCA and luminal irregularities of his proximal-distal LAD.  Medical management was recommended.  He had a unwitnessed syncopal episode 9/24.  His PCP ordered a cardiac event monitor 11/24.  Monitor showed very short episode of SVT, no ventricular tachycardia, pauses, atrial fibrillation or AV block.  Triggered events x 13 showed sinus rhythm.  Echocardiogram 12/24 showed LVEF of 55-60%, G1 DD, mild aortic calcification, and no other abnormalities.  He was seen in follow-up by Dr. Large 3/25 and remained stable at  that time.  He was seen and evaluated by neurology at [facility] for asymptomatic moderate stenosis of the left ICA and stenosis of his left vertebral artery.  At that time he was noted to have orthostatic hypotension and was symptomatic.  He was started on clopidogrel 75 mg daily.  He was seen and evaluated in the emergency department 5/25 following a syncopal episode.  He reportedly got up to use the bathroom 5  AM.  He sat on the toilet to use the bathroom.  After doing this he stood back up and became lightheaded and fell.  He was able to get off the floor and go to bed.  After going back to bed he reported vomiting a small amount of clear liquid.  He got up again at 7 AM to urinate and after standing from his bed felt lightheaded and collapsed again.  He did not know the duration he was on the floor before getting back up.  He felt on a trash can in his room and was noted to have a hematoma of his left ribs.  He denied chest pain, shortness of breath, fever, diarrhea, palpitations.  His blood pressure was noted to be 95/49 in the emergency department and improved.  His orthostatic vitals were negative.  Vitals were performed after receiving a unit of blood.  His head CT was negative.  He was encouraged to increase his fluids.  It was felt that polypharmacy contributed to his episode along with anemia.  His echocardiogram 01/22/2024 showed normal LVEF, normal diastolic parameters, and no significant valvular abnormalities.  His hemoglobin improved from 8.7 up to 9.7 after a unit of blood.  Baseline hemoglobin was around 13.  He denied bleeding issues.  Occult stool was recommended to evaluate for GI bleed.  His lisinopril  and labetalol  were held and it was felt that less aggressive blood pressure management may be warranted.  He presented to the clinic 02/13/24 for evaluation and stated he had been doing well.  He noted an episode of high blood pressure about 4 days prior.  He reported that his blood pressure was 175/105.  He did note some chest discomfort with the elevated pressure and took a sublingual nitroglycerin .  His blood pressure came down  and he felt better.  We reviewed his hospitalization and his episode of syncope.  He had repeat lab work done with his PCP which showed a hemoglobin of 11.4.  His blood pressure was 144/96 and on recheck was 138/80.  He reported that neurology would like him to keep his blood  pressure between 135-145 systolic and diastolic between 80 and 85.  I will renewed his lisinopril  and prescribed 5 mg as needed for systolic blood pressure greater than 150.  I planned follow-up in 3 to 4 months.  He presents to the clinic today for follow-up evaluation and states he is somewhat concerned that he has been losing weight.  He has been trying to increase the calories in his diet.  He stays very active playing disc golf.  He denies exertional chest discomfort.  He does note 1 episode of chest discomfort that came on at rest.  He did take sublingual nitroglycerin  for this.  He has only had 1 episode since he was seen 3 months ago.  He also notes that he has only taken 1 dose of as needed lisinopril  for systolic blood pressure that was 163.  His blood pressure today is 138/82.  He denies further syncopal episodes.  His PCP  has been monitoring his hemoglobin and hematocrit.  They have been increasing.  I will plan follow-up for 6 to 9 months and have him continue his high iron diet.  We will continue his current medication regimen.  Today he denies chest pain, shortness of breath, lower extremity edema, fatigue, palpitations, melena, hematuria, hemoptysis, diaphoresis, weakness, presyncope, syncope, orthopnea, and PND.   Home Medications    Prior to Admission medications   Medication Sig Start Date End Date Taking? Authorizing Provider  aspirin  EC 81 MG tablet Take 81 mg by mouth daily. Swallow whole.    [provider]  atorvastatin  (LIPITOR) 80 MG tablet Take 1 tablet by mouth at bedtime. 10/12/23 10/11/24  [provider]  buPROPion  (WELLBUTRIN  XL) 150 MG 24 hr tablet Take 450 mg by mouth daily.    [provider]  Cholecalciferol  (VITAMIN D) 50 MCG (2000 UT) tablet Take 2,000 Units by mouth daily.    [provider]  clopidogrel (PLAVIX) 75 MG tablet Take 1 tablet by mouth daily. 10/12/23   [provider]  Cyanocobalamin  (B-12 PO) Take 1 capsule  by mouth daily.    [provider]  diclofenac  Sodium (VOLTAREN ) 1 % GEL Apply 1 Application topically 4 (four) times daily as needed (pain).    [provider]  EPINEPHrine  (EPIPEN  2-PAK) 0.3 mg/0.3 mL IJ SOAJ injection Inject 0.3 mg into the muscle as needed for anaphylaxis.    [provider]  levothyroxine  (SYNTHROID ) 137 MCG tablet Take 137 mcg by mouth daily before breakfast. 04/01/16   [provider]  methocarbamol  (ROBAXIN ) 500 MG tablet Take 1 tablet (500 mg total) by mouth every 6 (six) hours as needed for muscle spasms. Patient not taking: Reported on 01/21/2024 04/07/22   Kay Kemps, MD  naloxone [facility]) nasal spray 4 mg/0.1 mL Place 1 spray into the nose daily as needed (for overdose). 07/07/23   [provider]  nitroGLYCERIN  (NITROSTAT ) 0.4 MG SL tablet Place 1 tablet (0.4 mg total) under the tongue every 5 (five) minutes as needed for chest pain. 09/27/23   Jeffrie Oneil BROCKS, MD  omeprazole (PRILOSEC) 20 MG capsule Take 20 mg by mouth daily.    [provider]  oxyCODONE  (ROXICODONE ) 15 MG immediate release tablet Take 30 mg by mouth daily as needed for pain. 01/25/23   [provider]  QUEtiapine  (SEROQUEL ) 100 MG tablet Take 300 mg by mouth at bedtime. 06/06/21   [provider]  temazepam  (RESTORIL ) 15 MG capsule Take 15 mg by mouth daily. 05/24/23   [provider]    Family History    Family History  Problem Relation Age of Onset   Hypertension Mother    Hypertension Father    Valvular heart disease Father    Cancer Other    Hypertension Other    He indicated that his mother is alive. He indicated that his father is alive. He indicated that his maternal grandmother is deceased. He indicated that his maternal grandfather is deceased. He indicated that his paternal grandmother is deceased. He indicated that his paternal grandfather is deceased. He indicated that the status of his other is  unknown.  Social History    Social History   Socioeconomic History   Marital status: Divorced    Spouse name: Not on file   Number of children: 1   Years of education: Not on file   Highest education level: Some college, no degree  Occupational History   Not on file  Tobacco Use   Smoking status: Former    Current packs/day: 0.00    Average packs/day: 0.3 packs/day for 35.0 years (8.8 ttl pk-yrs)    Types: Cigarettes    Start date: 05/05/1986    Quit date: 05/05/2021    Years since quitting: 3.0   Smokeless tobacco: Never  Vaping Use   Vaping status: Never Used  Substance and Sexual Activity   Alcohol use: No   Drug use: No   Sexual activity: Never  Other Topics Concern   Not on file  Social History Narrative   Lives at home with his parents    Right handed   Caffeine: about 36 oz of soda daily   Social Drivers of Corporate investment banker Strain: Not on file  Food Insecurity: No Food Insecurity (01/21/2024)   Hunger Vital Sign    Worried About Running Out of Food in the Last Year: Never true    Ran Out of Food in the Last Year: Never true  Transportation Needs: No Transportation Needs (01/21/2024)   PRAPARE - Administrator, Civil Service (Medical): No    Lack of Transportation (Non-Medical): No  Physical Activity: Not on file  Stress: Not on file  Social Connections: Not on file  Intimate Partner Violence: Not At Risk (01/21/2024)   Humiliation, Afraid, Rape, and Kick questionnaire    Fear of Current or Ex-Partner: No    Emotionally Abused: No    Physically Abused: No    Sexually Abused: No     Review of Systems    General:  No chills, fever, night sweats or weight changes.  Cardiovascular:  No chest pain, dyspnea on exertion, edema, orthopnea, palpitations, paroxysmal nocturnal dyspnea. Dermatological: No rash, lesions/masses Respiratory: No cough, dyspnea Urologic: No hematuria, dysuria Abdominal:   No nausea, vomiting, diarrhea, bright red  blood per rectum, melena, or hematemesis Neurologic:  No visual changes, wkns, changes in mental status. All other systems reviewed and are otherwise negative except as noted above.  Physical Exam    VS:  BP 138/82 (BP Location: Left Arm, Patient Position: Sitting)   Pulse 67   Ht 5' 9 (1.753 m)   Wt 138 lb (62.6 kg)   SpO2 100%   BMI 20.38 kg/m  , BMI Body mass index is 20.38 kg/m. GEN: Well nourished, well developed, in no acute distress. HEENT: normal. Neck: Supple, no JVD, carotid bruits, or masses. Cardiac: RRR, no murmurs, rubs, or gallops. No clubbing, cyanosis, edema.  Radials/DP/PT 2+ and equal bilaterally.  Respiratory:  Respirations regular and unlabored, clear to auscultation bilaterally. GI: Soft, nontender, nondistended, BS + x 4. MS: no deformity or atrophy. Skin: warm and dry, no rash. Neuro:  Strength and sensation are intact. Psych: Normal affect.  Accessory Clinical Findings    Recent Labs: 01/21/2024: BUN 18; Creatinine, Ser 0.78; Potassium 3.6; Sodium 141 01/22/2024: Hemoglobin 9.7; Platelets 160   Recent Lipid Panel    Component Value Date/Time   CHOL 134 04/10/2018 0907   TRIG 111 04/10/2018 0907   HDL 53 04/10/2018 0907   CHOLHDL 2.5 04/10/2018 0907   CHOLHDL 7.1 10/04/2011 0545   VLDL 74 (H) 10/04/2011 0545   LDLCALC 59 04/10/2018 0907         ECG personally reviewed by me today-none today.     Echocardiogram 01/22/2024  IMPRESSIONS     1. Left ventricular ejection fraction, by estimation, is 55 to 60%. The  left ventricle has normal function. The left  ventricle has no regional  wall motion abnormalities. Left ventricular diastolic parameters were  normal.   2. Right ventricular systolic function is normal. The right ventricular  size is normal. Tricuspid regurgitation signal is inadequate for assessing  PA pressure.   3. The mitral valve is normal in structure. No evidence of mitral valve  regurgitation. No evidence of mitral  stenosis.   4. The aortic valve is grossly normal. There is mild calcification of the  aortic valve. There is mild thickening of the aortic valve. Aortic valve  regurgitation is not visualized. Mild aortic valve stenosis.   5. The inferior vena cava is normal in size with greater than 50%  respiratory variability, suggesting right atrial pressure of 3 mmHg.   FINDINGS   Left Ventricle: Left ventricular ejection fraction, by estimation, is 55  to 60%. The left ventricle has normal function. The left ventricle has no  regional wall motion abnormalities. The left ventricular internal cavity  size was normal in size. There is   no left ventricular hypertrophy. Left ventricular diastolic parameters  were normal. Normal left ventricular filling pressure.   Right Ventricle: The right ventricular size is normal. No increase in  right ventricular wall thickness. Right ventricular systolic function is  normal. Tricuspid regurgitation signal is inadequate for assessing PA  pressure.   Left [facility]: Left atrial size was normal in size.   Right [facility]: Right atrial size was normal in size.   Pericardium: There is no evidence of pericardial effusion.   Mitral Valve: The mitral valve is normal in structure. No evidence of  mitral valve regurgitation. No evidence of mitral valve stenosis.   Tricuspid Valve: The tricuspid valve is normal in structure. Tricuspid  valve regurgitation is not demonstrated.   Aortic Valve: The aortic valve is grossly normal. There is mild  calcification of the aortic valve. There is mild thickening of the aortic  valve. Aortic valve regurgitation is not visualized. Mild aortic stenosis  is present. Aortic valve mean gradient  measures 6.5 mmHg. Aortic valve peak gradient measures 11.6 mmHg. Aortic  valve area, by VTI measures 1.20 cm.   Pulmonic Valve: The pulmonic valve was not well visualized. Pulmonic valve  regurgitation is not visualized. No evidence of  pulmonic stenosis.   Aorta: The aortic root is normal in size and structure.   Venous: The inferior vena cava is normal in size with greater than 50%  respiratory variability, suggesting right atrial pressure of 3 mmHg.   IAS/Shunts: The interatrial septum was not well visualized.       Assessment & Plan   1. Coronary artery disease-no chest pain today.  Denies exertional chest discomfort.  Reports 1 episode of nonexertional chest pain in the last 3 months.  He did take sublingual nitroglycerin  for this.  He does stay very physically active playing disc golf daily.  Nonobstructive disease on cardiac catheterization in 2019. Continue atorvastatin , clopidogrel Heart healthy low-sodium diet Increase physical activity as tolerated  Essential hypertension-BP today 138/82. Neurology recommends SBP between 145-135  Maintain blood pressure log Heart healthy low-sodium diet  Hyperlipidemia-LDL 40 on 02/12/24 Continue atorvastatin ,  clopidogrel High-fiber diet Follows with PCP   Syncope-stable no further episodes.  Previously felt to be multifactorial in the setting of polypharmacy and anemia.  Denies bleeding issues.  He denies palpitations, and irregular  heartbeats.  Previously wore cardiac event monitor which showed very short episode of SVT and no VT, atrial fibrillation or AV block. Maintain p.o. hydration-reviewed Maintain blood  pressure log Change positions slowly   Disposition: Follow-up with Dr. Michele or me in 6-9 months.   Josefa HERO. Anaalicia Reimann NP-C     05/27/2024, 8:25 AM [facility] [address] Office [phone] Fax [phone]    I spent 15 minutes examining this patient, reviewing medications, and using patient centered shared decision making involving their cardiac care.   I spent  20 minutes reviewing past medical history,  medications, and prior cardiac tests.

## 2024-05-22 DIAGNOSIS — I6523 Occlusion and stenosis of bilateral carotid arteries: Secondary | ICD-10-CM | POA: Diagnosis not present

## 2024-05-22 DIAGNOSIS — E039 Hypothyroidism, unspecified: Secondary | ICD-10-CM | POA: Diagnosis not present

## 2024-05-22 DIAGNOSIS — Z23 Encounter for immunization: Secondary | ICD-10-CM | POA: Diagnosis not present

## 2024-05-22 DIAGNOSIS — I669 Occlusion and stenosis of unspecified cerebral artery: Secondary | ICD-10-CM | POA: Diagnosis not present

## 2024-05-22 DIAGNOSIS — I10 Essential (primary) hypertension: Secondary | ICD-10-CM | POA: Diagnosis not present

## 2024-05-22 DIAGNOSIS — I6502 Occlusion and stenosis of left vertebral artery: Secondary | ICD-10-CM | POA: Diagnosis not present

## 2024-05-22 DIAGNOSIS — E0500 Thyrotoxicosis with diffuse goiter without thyrotoxic crisis or storm: Secondary | ICD-10-CM | POA: Diagnosis not present

## 2024-05-22 DIAGNOSIS — I2510 Atherosclerotic heart disease of native coronary artery without angina pectoris: Secondary | ICD-10-CM | POA: Diagnosis not present

## 2024-05-22 DIAGNOSIS — M4712 Other spondylosis with myelopathy, cervical region: Secondary | ICD-10-CM | POA: Diagnosis not present

## 2024-05-22 DIAGNOSIS — I951 Orthostatic hypotension: Secondary | ICD-10-CM | POA: Diagnosis not present

## 2024-05-27 ENCOUNTER — Ambulatory Visit: Attending: General Practice | Admitting: General Practice

## 2024-05-27 ENCOUNTER — Encounter: Payer: Self-pay | Admitting: General Practice

## 2024-05-27 VITALS — BP 138/82 | HR 67 | Ht 69.0 in | Wt 138.0 lb

## 2024-05-27 DIAGNOSIS — R55 Syncope and collapse: Secondary | ICD-10-CM | POA: Diagnosis not present

## 2024-05-27 DIAGNOSIS — E782 Mixed hyperlipidemia: Secondary | ICD-10-CM

## 2024-05-27 DIAGNOSIS — I10 Essential (primary) hypertension: Secondary | ICD-10-CM

## 2024-05-27 DIAGNOSIS — I2510 Atherosclerotic heart disease of native coronary artery without angina pectoris: Secondary | ICD-10-CM

## 2024-05-27 NOTE — Patient Instructions (Signed)
 Medication Instructions:  Your physician recommends that you continue on your current medications as directed. Please refer to the Current Medication list given to you today.  *If you need a refill on your cardiac medications before your next appointment, please call your pharmacy*  Lab Work: None ordered  If you have labs (blood work) drawn today and your tests are completely normal, you will receive your results only by: MyChart Message (if you have MyChart) OR A paper copy in the mail If you have any lab test that is abnormal or we need to change your treatment, we will call you to review the results.  Testing/Procedures: None ordered  Follow-Up: At [facility], you and your health needs are our priority.  As part of our continuing mission to provide you with exceptional heart care, our providers are all part of one team.  This team includes your primary Cardiologist (physician) and Advanced Practice Providers or APPs (Physician Assistants and Nurse Practitioners) who all work together to provide you with the care you need, when you need it.  Your next appointment:   6-9 month(s)  Provider:   Madonna Large, DO or Josefa Beauvais, NP          We recommend signing up for the patient portal called MyChart.  Sign up information is provided on this After Visit Summary.  MyChart is used to connect with patients for Virtual Visits (Telemedicine).  Patients are able to view lab/test results, encounter notes, upcoming appointments, etc.  Non-urgent messages can be sent to your provider as well.   To learn more about what you can do with MyChart, go to [email or web address].   Other Instructions Iron-Rich Diet  Iron is a mineral that helps your body produce hemoglobin. Hemoglobin is a protein in red blood cells that carries oxygen to your body's tissues. Eating too little iron may cause you to feel weak and tired, and it can increase your risk of infection. Iron is naturally found  in many foods, and many foods have iron added to them (are iron-fortified). You may need to follow an iron-rich diet if you do not have enough iron in your body due to certain medical conditions. The amount of iron that you need each day depends on your age, your sex, and any medical conditions you have. Follow instructions from your health care provider or a dietitian about how much iron you should eat each day. What are tips for following this plan? Reading food labels Check food labels to see how many milligrams (mg) of iron are in each serving. Cooking Cook foods in pots and pans that are made from iron. Take these steps to make it easier for your body to absorb iron from certain foods: Soak beans overnight before cooking. Soak whole grains overnight and drain them before using. Ferment flours before baking, such as by using yeast in bread dough. Meal planning When you eat foods that contain iron, you should eat them with foods that are high in vitamin C. These include oranges, peppers, tomatoes, potatoes, and mangoes. Vitamin C helps your body absorb iron. Certain foods and drinks prevent your body from absorbing iron properly. Avoid eating these foods in the same meal as iron-rich foods or with iron supplements. These foods include: Coffee, black tea, and red wine. Milk, dairy products, and foods that are high in calcium . Beans and soybeans. Whole grains. General information Take iron supplements only as told by your health care provider. An overdose of iron can be  life-threatening. If you were prescribed iron supplements, take them with orange juice or a vitamin C supplement. When you eat iron-fortified foods or take an iron supplement, you should also eat foods that naturally contain iron, such as meat, poultry, and fish. Eating naturally iron-rich foods helps your body absorb the iron that is added to other foods or contained in a supplement. Iron from animal sources is better absorbed  than iron from plant sources. What foods should I eat? Vegetables Spinach (cooked). Green peas. Broccoli. Fermented vegetables. Eat vegetables high in vitamin C, such as leafy greens, potatoes, bell peppers, and tomatoes, with iron-rich foods. Grains Iron-fortified breakfast cereal. Iron-fortified whole-wheat bread. Enriched rice. Sprouted grains. Meats and other proteins Beef liver. Beef. [place]. Chicken. Oysters. Shrimp. Tuna. Sardines. Chickpeas. Nuts. Tofu. Pumpkin seeds. Beverages Tomato juice. Fresh orange juice. Prune juice. Hibiscus tea. Iron-fortified instant breakfast shakes. Sweets and desserts Blackstrap molasses. Seasonings and condiments Tahini. Fermented soy sauce. Other foods Wheat germ. The items listed above may not be a complete list of recommended foods and beverages. Contact a dietitian for more information. What foods should I limit? These are foods that should be limited while eating iron-rich foods as they can reduce the absorption of iron in your body. Grains Whole grains. Bran cereal. Bran flour. Meats and other proteins Soybeans. Products made from soy protein. Black beans. Lentils. Mung beans. Split peas. Dairy Milk. Cream. Cheese. Yogurt. Cottage cheese. Beverages Coffee. Black tea. Red wine. Sweets and desserts Cocoa. Chocolate. Ice cream. Seasonings and condiments Basil. Oregano. Large amounts of parsley. The items listed above may not be a complete list of foods and beverages you should limit. Contact a dietitian for more information. Summary Iron is a mineral that helps your body produce hemoglobin. Hemoglobin is a protein in red blood cells that carries oxygen to your body's tissues. Iron is naturally found in many foods, and many foods have iron added to them (are iron-fortified). When you eat foods that contain iron, you should eat them with foods that are high in vitamin C. Vitamin C helps your body absorb iron. Certain foods and drinks  prevent your body from absorbing iron properly, such as whole grains and dairy products. You should avoid eating these foods in the same meal as iron-rich foods or with iron supplements. This information is not intended to replace advice given to you by your health care provider. Make sure you discuss any questions you have with your health care provider. Document Revised: 08/05/2023 Document Reviewed: 08/05/2023 Elsevier Patient Education  2025 [employer].

## 2024-05-29 DIAGNOSIS — M5416 Radiculopathy, lumbar region: Secondary | ICD-10-CM | POA: Diagnosis not present

## 2024-06-03 DIAGNOSIS — I2510 Atherosclerotic heart disease of native coronary artery without angina pectoris: Secondary | ICD-10-CM | POA: Diagnosis not present

## 2024-06-03 DIAGNOSIS — M4712 Other spondylosis with myelopathy, cervical region: Secondary | ICD-10-CM | POA: Diagnosis not present

## 2024-06-03 DIAGNOSIS — J432 Centrilobular emphysema: Secondary | ICD-10-CM | POA: Diagnosis not present

## 2024-06-17 DIAGNOSIS — M5412 Radiculopathy, cervical region: Secondary | ICD-10-CM | POA: Diagnosis not present

## 2024-06-17 DIAGNOSIS — M542 Cervicalgia: Secondary | ICD-10-CM | POA: Diagnosis not present

## 2024-06-24 DIAGNOSIS — M542 Cervicalgia: Secondary | ICD-10-CM | POA: Diagnosis not present

## 2024-09-30 ENCOUNTER — Other Ambulatory Visit: Payer: Self-pay | Admitting: Cardiology
